# Patient Record
Sex: Female | Born: 1945
Health system: Southern US, Community
[De-identification: ages and names within clinical notes are randomized; demographics above are authoritative.]

## PROBLEM LIST (undated history)

## (undated) DIAGNOSIS — E785 Hyperlipidemia, unspecified: Secondary | ICD-10-CM

## (undated) DIAGNOSIS — F419 Anxiety disorder, unspecified: Secondary | ICD-10-CM

## (undated) DIAGNOSIS — M069 Rheumatoid arthritis, unspecified: Secondary | ICD-10-CM

## (undated) DIAGNOSIS — K219 Gastro-esophageal reflux disease without esophagitis: Secondary | ICD-10-CM

## (undated) DIAGNOSIS — M1990 Unspecified osteoarthritis, unspecified site: Secondary | ICD-10-CM

## (undated) DIAGNOSIS — B029 Zoster without complications: Secondary | ICD-10-CM

## (undated) DIAGNOSIS — T7840XA Allergy, unspecified, initial encounter: Secondary | ICD-10-CM

## (undated) DIAGNOSIS — I10 Essential (primary) hypertension: Secondary | ICD-10-CM

## (undated) HISTORY — DX: Anxiety disorder, unspecified: F41.9

## (undated) HISTORY — DX: Hyperlipidemia, unspecified: E78.5

## (undated) HISTORY — DX: Allergy, unspecified, initial encounter: T78.40XA

## (undated) HISTORY — DX: Essential (primary) hypertension: I10

## (undated) HISTORY — PX: FOOT SURGERY: SHX648

## (undated) HISTORY — DX: Gastro-esophageal reflux disease without esophagitis: K21.9

---

## 1992-09-19 HISTORY — PX: ABDOMINAL HYSTERECTOMY: SHX81

## 2008-07-22 ENCOUNTER — Ambulatory Visit: Payer: Self-pay | Admitting: Orthopedic Surgery

## 2008-07-29 ENCOUNTER — Ambulatory Visit: Payer: Self-pay | Admitting: Orthopedic Surgery

## 2008-07-29 HISTORY — PX: CARPAL TUNNEL RELEASE: SHX101

## 2013-07-02 DIAGNOSIS — E782 Mixed hyperlipidemia: Secondary | ICD-10-CM | POA: Diagnosis not present

## 2013-07-02 DIAGNOSIS — I10 Essential (primary) hypertension: Secondary | ICD-10-CM | POA: Diagnosis not present

## 2013-07-02 DIAGNOSIS — Z23 Encounter for immunization: Secondary | ICD-10-CM | POA: Diagnosis not present

## 2013-07-02 DIAGNOSIS — F411 Generalized anxiety disorder: Secondary | ICD-10-CM | POA: Diagnosis not present

## 2013-07-02 DIAGNOSIS — Z0000 Encounter for general adult medical examination without abnormal findings: Secondary | ICD-10-CM | POA: Diagnosis not present

## 2013-07-04 DIAGNOSIS — Z1231 Encounter for screening mammogram for malignant neoplasm of breast: Secondary | ICD-10-CM | POA: Diagnosis not present

## 2013-08-11 DIAGNOSIS — J019 Acute sinusitis, unspecified: Secondary | ICD-10-CM | POA: Diagnosis not present

## 2013-09-17 DIAGNOSIS — J069 Acute upper respiratory infection, unspecified: Secondary | ICD-10-CM | POA: Diagnosis not present

## 2013-10-04 DIAGNOSIS — I10 Essential (primary) hypertension: Secondary | ICD-10-CM | POA: Diagnosis not present

## 2013-10-04 DIAGNOSIS — F411 Generalized anxiety disorder: Secondary | ICD-10-CM | POA: Diagnosis not present

## 2013-11-04 DIAGNOSIS — I10 Essential (primary) hypertension: Secondary | ICD-10-CM | POA: Diagnosis not present

## 2013-11-18 DIAGNOSIS — R059 Cough, unspecified: Secondary | ICD-10-CM | POA: Diagnosis not present

## 2013-11-18 DIAGNOSIS — R05 Cough: Secondary | ICD-10-CM | POA: Diagnosis not present

## 2013-11-18 DIAGNOSIS — I10 Essential (primary) hypertension: Secondary | ICD-10-CM | POA: Diagnosis not present

## 2013-11-18 DIAGNOSIS — E782 Mixed hyperlipidemia: Secondary | ICD-10-CM | POA: Diagnosis not present

## 2014-02-18 DIAGNOSIS — E785 Hyperlipidemia, unspecified: Secondary | ICD-10-CM | POA: Diagnosis not present

## 2014-02-18 DIAGNOSIS — E781 Pure hyperglyceridemia: Secondary | ICD-10-CM | POA: Diagnosis not present

## 2014-02-18 DIAGNOSIS — I10 Essential (primary) hypertension: Secondary | ICD-10-CM | POA: Diagnosis not present

## 2014-02-18 DIAGNOSIS — F411 Generalized anxiety disorder: Secondary | ICD-10-CM | POA: Diagnosis not present

## 2014-03-20 DIAGNOSIS — I10 Essential (primary) hypertension: Secondary | ICD-10-CM | POA: Diagnosis not present

## 2014-03-20 DIAGNOSIS — E785 Hyperlipidemia, unspecified: Secondary | ICD-10-CM | POA: Diagnosis not present

## 2014-07-10 DIAGNOSIS — R296 Repeated falls: Secondary | ICD-10-CM | POA: Diagnosis not present

## 2014-07-10 DIAGNOSIS — E785 Hyperlipidemia, unspecified: Secondary | ICD-10-CM | POA: Diagnosis not present

## 2014-07-10 DIAGNOSIS — F419 Anxiety disorder, unspecified: Secondary | ICD-10-CM | POA: Diagnosis not present

## 2014-07-10 DIAGNOSIS — Z23 Encounter for immunization: Secondary | ICD-10-CM | POA: Diagnosis not present

## 2014-07-10 DIAGNOSIS — I10 Essential (primary) hypertension: Secondary | ICD-10-CM | POA: Diagnosis not present

## 2014-07-10 DIAGNOSIS — Z1211 Encounter for screening for malignant neoplasm of colon: Secondary | ICD-10-CM | POA: Diagnosis not present

## 2014-10-09 DIAGNOSIS — E785 Hyperlipidemia, unspecified: Secondary | ICD-10-CM | POA: Diagnosis not present

## 2014-10-09 DIAGNOSIS — I10 Essential (primary) hypertension: Secondary | ICD-10-CM | POA: Diagnosis not present

## 2014-10-09 DIAGNOSIS — F419 Anxiety disorder, unspecified: Secondary | ICD-10-CM | POA: Diagnosis not present

## 2014-10-09 DIAGNOSIS — K219 Gastro-esophageal reflux disease without esophagitis: Secondary | ICD-10-CM | POA: Diagnosis not present

## 2014-10-09 DIAGNOSIS — R21 Rash and other nonspecific skin eruption: Secondary | ICD-10-CM | POA: Diagnosis not present

## 2014-10-21 DIAGNOSIS — L301 Dyshidrosis [pompholyx]: Secondary | ICD-10-CM | POA: Diagnosis not present

## 2014-10-21 DIAGNOSIS — L03119 Cellulitis of unspecified part of limb: Secondary | ICD-10-CM | POA: Diagnosis not present

## 2015-01-08 DIAGNOSIS — I10 Essential (primary) hypertension: Secondary | ICD-10-CM | POA: Diagnosis not present

## 2015-01-08 DIAGNOSIS — E785 Hyperlipidemia, unspecified: Secondary | ICD-10-CM | POA: Diagnosis not present

## 2015-01-08 DIAGNOSIS — K219 Gastro-esophageal reflux disease without esophagitis: Secondary | ICD-10-CM | POA: Diagnosis not present

## 2015-01-08 DIAGNOSIS — Z1389 Encounter for screening for other disorder: Secondary | ICD-10-CM | POA: Diagnosis not present

## 2015-01-08 DIAGNOSIS — R011 Cardiac murmur, unspecified: Secondary | ICD-10-CM | POA: Diagnosis not present

## 2015-01-08 DIAGNOSIS — F419 Anxiety disorder, unspecified: Secondary | ICD-10-CM | POA: Diagnosis not present

## 2015-02-09 DIAGNOSIS — I10 Essential (primary) hypertension: Secondary | ICD-10-CM | POA: Diagnosis not present

## 2015-02-09 DIAGNOSIS — E785 Hyperlipidemia, unspecified: Secondary | ICD-10-CM | POA: Diagnosis not present

## 2015-04-08 ENCOUNTER — Telehealth: Payer: Self-pay | Admitting: Family Medicine

## 2015-04-08 ENCOUNTER — Encounter: Payer: Self-pay | Admitting: Family Medicine

## 2015-04-08 MED ORDER — ATORVASTATIN CALCIUM 20 MG PO TABS: 20.0000 mg | ORAL_TABLET | Freq: Every day | ORAL | Status: DC

## 2015-04-08 NOTE — Telephone Encounter (Signed)
Medication has been refilled and sent to [facility] patient has next scheduled appointment on 05/19/2015

## 2015-04-08 NOTE — Telephone Encounter (Signed)
NEED ATORVASTATIN 20MG  REFILLED. [place]

## 2015-04-08 NOTE — Telephone Encounter (Signed)
PT NOTIFIED ABOUT RX AND NEXT APPT

## 2015-04-20 ENCOUNTER — Other Ambulatory Visit: Payer: Self-pay | Admitting: Family Medicine

## 2015-05-19 ENCOUNTER — Encounter: Payer: Self-pay | Admitting: Family Medicine

## 2015-05-19 ENCOUNTER — Ambulatory Visit (INDEPENDENT_AMBULATORY_CARE_PROVIDER_SITE_OTHER): Payer: Medicare Other | Admitting: Family Medicine

## 2015-05-19 VITALS — BP 130/78 | HR 76 | Temp 98.0°F | Resp 18 | Ht 61.0 in | Wt 260.8 lb

## 2015-05-19 DIAGNOSIS — F419 Anxiety disorder, unspecified: Secondary | ICD-10-CM | POA: Diagnosis not present

## 2015-05-19 DIAGNOSIS — K219 Gastro-esophageal reflux disease without esophagitis: Secondary | ICD-10-CM | POA: Insufficient documentation

## 2015-05-19 DIAGNOSIS — I10 Essential (primary) hypertension: Secondary | ICD-10-CM | POA: Diagnosis not present

## 2015-05-19 DIAGNOSIS — Z9181 History of falling: Secondary | ICD-10-CM | POA: Insufficient documentation

## 2015-05-19 DIAGNOSIS — R748 Abnormal levels of other serum enzymes: Secondary | ICD-10-CM | POA: Diagnosis not present

## 2015-05-19 DIAGNOSIS — E785 Hyperlipidemia, unspecified: Secondary | ICD-10-CM | POA: Diagnosis not present

## 2015-05-19 DIAGNOSIS — R7989 Other specified abnormal findings of blood chemistry: Secondary | ICD-10-CM

## 2015-05-19 DIAGNOSIS — E668 Other obesity: Secondary | ICD-10-CM

## 2015-05-19 MED ORDER — METOPROLOL TARTRATE 100 MG PO TABS: 100.0000 mg | ORAL_TABLET | Freq: Two times a day (BID) | ORAL | Status: DC

## 2015-05-19 MED ORDER — ALPRAZOLAM 0.25 MG PO TABS: 0.2500 mg | ORAL_TABLET | Freq: Two times a day (BID) | ORAL | Status: DC | PRN

## 2015-05-19 MED ORDER — OMEPRAZOLE 20 MG PO CPDR: 20.0000 mg | DELAYED_RELEASE_CAPSULE | Freq: Every day | ORAL | Status: DC

## 2015-05-19 MED ORDER — ATORVASTATIN CALCIUM 20 MG PO TABS: 20.0000 mg | ORAL_TABLET | Freq: Every day | ORAL | Status: DC

## 2015-05-19 MED ORDER — VALSARTAN-HYDROCHLOROTHIAZIDE 320-25 MG PO TABS: 1.0000 | ORAL_TABLET | Freq: Every day | ORAL | Status: DC

## 2015-05-19 MED ORDER — LORCASERIN HCL 10 MG PO TABS: 1.0000 | ORAL_TABLET | Freq: Two times a day (BID) | ORAL | Status: DC

## 2015-05-19 NOTE — Progress Notes (Signed)
Name: Hayley Simmons   MRN: [id number]    DOB: 07-Jul-1946   Date:05/19/2015       Progress Note  Subjective  Chief Complaint  Chief Complaint  Patient presents with  . Follow-up    3 mo  . Hypertension  . Hyperlipidemia  . Gastrophageal Reflux  . Anxiety  . Medication Refill    all meds    Hypertension This is a chronic problem. The problem is controlled. Associated symptoms include anxiety. Pertinent negatives include no chest pain, headaches, palpitations or shortness of breath. Past treatments include angiotensin blockers, diuretics and beta blockers. There is no history of kidney disease, CAD/MI or CVA.  Hyperlipidemia This is a chronic problem. The problem is controlled. Recent lipid tests were reviewed and are high. Pertinent negatives include no chest pain, leg pain, myalgias or shortness of breath. Current antihyperlipidemic treatment includes statins. Risk factors for coronary artery disease include dyslipidemia.  Gastrophageal Reflux She reports no abdominal pain, no belching, no chest pain, no heartburn or no sore throat. This is a chronic problem. Risk factors include obesity. She has tried a PPI for the symptoms. Past procedures do not include an EGD.  Anxiety Presents for follow-up visit. Symptoms include irritability and nervous/anxious behavior. Patient reports no chest pain, insomnia, palpitations, panic or shortness of breath. Symptoms occur most days.   Past treatments include benzodiazephines. The treatment provided moderate (pt. feels stressed and irritable and is requesting an increase in dose of Alprazolam.) relief. Compliance with prior treatments has been good.  Obesity Pt. Is here to be considered for starting Belviq for obesity. She has gained 2 lbs in the last 3 months. BMI is 49.28, and she is considered morbidly obese. She has not tried any weight-loss supplement before.  Past Medical History  Diagnosis Date  . Hyperlipidemia   . Hypertension   .  Allergy   . Anxiety   . GERD (gastroesophageal reflux disease)     Past Surgical History  Procedure Laterality Date  . Carpal tunnel release Right 07/29/2008  . Abdominal hysterectomy  1994    Family History  Problem Relation Age of Onset  . Gout Mother   . Dementia Mother   . Kidney disease Mother   . Cancer Mother     kidney  . Heart attack Father     Social History   Social History  . Marital Status: Married    Spouse Name: N/A  . Number of Children: N/A  . Years of Education: N/A   Occupational History  . Not on file.   Social History Main Topics  . Smoking status: Former Research scientist (life sciences)  . Smokeless tobacco: Former Systems developer    Quit date: 04/19/1998  . Alcohol Use: No  . Drug Use: No  . Sexual Activity: No   Other Topics Concern  . Not on file   Social History Narrative  . No narrative on file     Current outpatient prescriptions:  .  ALPRAZolam (XANAX) 0.5 MG tablet, Take 0.5 mg by mouth at bedtime as needed for anxiety., Disp: , Rfl:  .  aspirin 81 MG tablet, Take by mouth., Disp: , Rfl:  .  atorvastatin (LIPITOR) 20 MG tablet, Take 1 tablet (20 mg total) by mouth at bedtime., Disp: 90 tablet, Rfl: 0 .  CALCIUM CARBONATE-VIT D-MIN PO, Take by mouth., Disp: , Rfl:  .  metoprolol (LOPRESSOR) 100 MG tablet, TAKE 1 TABLET BY MOUTH TWICE DAILY., Disp: 180 tablet, Rfl: 0 .  omeprazole (PRILOSEC)  20 MG capsule, TAKE 1 CAPSULE BY MOUTH DAILY., Disp: 90 capsule, Rfl: 0 .  valsartan-hydrochlorothiazide (DIOVAN-HCT) 320-25 MG per tablet, TAKE 1 TABLET BY MOUTH DAILY., Disp: 90 tablet, Rfl: 0  Allergies  Allergen Reactions  . Cyclosporine Other (See Comments) and Nausea And Vomiting  . Caduet  [Amlodipine-Atorvastatin]     makes pt feel bad  . Escitalopram Other (See Comments)    Review of Systems  Constitutional: Positive for irritability.  HENT: Negative for sore throat.   Respiratory: Negative for shortness of breath.   Cardiovascular: Negative for chest pain and  palpitations.  Gastrointestinal: Negative for heartburn and abdominal pain.  Musculoskeletal: Negative for myalgias.  Neurological: Negative for headaches.  Psychiatric/Behavioral: The patient is nervous/anxious. The patient does not have insomnia.     Objective  Filed Vitals:   05/19/15 0847  BP: 130/78  Pulse: 76  Temp: 98 F (36.7 C)  TempSrc: Oral  Resp: 18  Height: 5\' 1"  (1.549 m)  Weight: 260 lb 12.8 oz (118.298 kg)  SpO2: 90%    Physical Exam  Constitutional: She is oriented to person, place, and time and well-developed, well-nourished, and in no distress.  HENT:  Head: Normocephalic and atraumatic.  Cardiovascular: Normal rate and regular rhythm.   Pulmonary/Chest: Effort normal and breath sounds normal.  Neurological: She is alert and oriented to person, place, and time.  Skin: Skin is warm and dry.  Psychiatric: Memory, affect and judgment normal.  Nursing note and vitals reviewed.   Assessment & Plan  1. Essential hypertension  - metoprolol (LOPRESSOR) 100 MG tablet; Take 1 tablet (100 mg total) by mouth 2 (two) times daily.  Dispense: 180 tablet; Refill: 0 - valsartan-hydrochlorothiazide (DIOVAN-HCT) 320-25 MG per tablet; Take 1 tablet by mouth daily.  Dispense: 90 tablet; Refill: 0  2. Gastroesophageal reflux disease, esophagitis presence not specified  - omeprazole (PRILOSEC) 20 MG capsule; Take 1 capsule (20 mg total) by mouth daily.  Dispense: 90 capsule; Refill: 0  3. Hyperlipidemia  - atorvastatin (LIPITOR) 20 MG tablet; Take 1 tablet (20 mg total) by mouth at bedtime.  Dispense: 90 tablet; Refill: 1 - Basic Metabolic Panel (BMET) - Hepatic function panel - Lipid Profile  4. Anxiety  Continue on alprazolam at present dosage for now. Reevaluate in one month. - ALPRAZolam (XANAX) 0.25 MG tablet; Take 1 tablet (0.25 mg total) by mouth 2 (two) times daily as needed for anxiety.  Dispense: 60 tablet; Refill: 0  5. Extreme obesity  Started  patient on Belviq 10 mg twice a day. Patient educated on side effects and was advised to continue with dietary and lifestyle modification to achieve sustainable weight loss. Recheck in one month.  - Lorcaserin HCl (BELVIQ) 10 MG TABS; Take 1 tablet by mouth 2 (two) times daily.  Dispense: 60 tablet; Refill: 0 - CBC w/Diff/Platelet   Estefan Pattison Asad A. [facility] 05/19/2015 8:56 AM

## 2015-05-20 LAB — HEPATIC FUNCTION PANEL
ALBUMIN: 3.9 g/dL (ref 3.6–4.8)
ALT: 16 IU/L (ref 0–32)
AST: 17 IU/L (ref 0–40)
Alkaline Phosphatase: 141 IU/L — ABNORMAL HIGH (ref 39–117)
BILIRUBIN TOTAL: 0.5 mg/dL (ref 0.0–1.2)
Bilirubin, Direct: 0.14 mg/dL (ref 0.00–0.40)
TOTAL PROTEIN: 7 g/dL (ref 6.0–8.5)

## 2015-05-20 LAB — CBC WITH DIFFERENTIAL/PLATELET
BASOS ABS: 0 10*3/uL (ref 0.0–0.2)
Basos: 0 %
EOS (ABSOLUTE): 0.2 10*3/uL (ref 0.0–0.4)
Eos: 2 %
HEMATOCRIT: 39.2 % (ref 34.0–46.6)
Hemoglobin: 13.3 g/dL (ref 11.1–15.9)
Immature Grans (Abs): 0 10*3/uL (ref 0.0–0.1)
Immature Granulocytes: 0 %
LYMPHS ABS: 1.7 10*3/uL (ref 0.7–3.1)
Lymphs: 21 %
MCH: 29.6 pg (ref 26.6–33.0)
MCHC: 33.9 g/dL (ref 31.5–35.7)
MCV: 87 fL (ref 79–97)
MONOS ABS: 0.5 10*3/uL (ref 0.1–0.9)
Monocytes: 6 %
Neutrophils Absolute: 5.8 10*3/uL (ref 1.4–7.0)
Neutrophils: 71 %
Platelets: 275 10*3/uL (ref 150–379)
RBC: 4.5 x10E6/uL (ref 3.77–5.28)
RDW: 14.7 % (ref 12.3–15.4)
WBC: 8.3 10*3/uL (ref 3.4–10.8)

## 2015-05-20 LAB — LIPID PANEL
CHOL/HDL RATIO: 5.4 ratio — AB (ref 0.0–4.4)
Cholesterol, Total: 188 mg/dL (ref 100–199)
HDL: 35 mg/dL — AB (ref >39)
LDL CALC: 109 mg/dL — AB (ref 0–99)
Triglycerides: 220 mg/dL — ABNORMAL HIGH (ref 0–149)
VLDL CHOLESTEROL CAL: 44 mg/dL — AB (ref 5–40)

## 2015-05-20 LAB — BASIC METABOLIC PANEL
BUN/Creatinine Ratio: 16 (ref 11–26)
BUN: 19 mg/dL (ref 8–27)
CALCIUM: 9.4 mg/dL (ref 8.7–10.3)
CHLORIDE: 98 mmol/L (ref 97–108)
CO2: 27 mmol/L (ref 18–29)
Creatinine, Ser: 1.17 mg/dL — ABNORMAL HIGH (ref 0.57–1.00)
GFR calc Af Amer: 56 mL/min/{1.73_m2} — ABNORMAL LOW (ref >59)
GFR, EST NON AFRICAN AMERICAN: 49 mL/min/{1.73_m2} — AB (ref >59)
Glucose: 98 mg/dL (ref 65–99)
POTASSIUM: 4.5 mmol/L (ref 3.5–5.2)
Sodium: 142 mmol/L (ref 134–144)

## 2015-05-22 NOTE — Addendum Note (Signed)
Addended byManuella Ghazi, Kuzey Ogata A A on: 05/22/2015 03:57 PM   Modules accepted: Orders

## 2015-05-26 ENCOUNTER — Telehealth: Payer: Self-pay | Admitting: Family Medicine

## 2015-05-26 NOTE — Telephone Encounter (Signed)
Patient received a call on last week and was told that she was in kidney failure based on lab work. She was not told what to expect with this condition or anything.  Please return call to discuss this information. Please explain to her what is going on with this condition. [phone]

## 2015-05-27 NOTE — Telephone Encounter (Signed)
Spoke with patient and informed her that a referral has been made and she will be contacted to schedule appointment, patient verbalized understanding

## 2015-06-08 DIAGNOSIS — N183 Chronic kidney disease, stage 3 (moderate): Secondary | ICD-10-CM | POA: Diagnosis not present

## 2015-06-08 DIAGNOSIS — I10 Essential (primary) hypertension: Secondary | ICD-10-CM | POA: Diagnosis not present

## 2015-06-08 DIAGNOSIS — E785 Hyperlipidemia, unspecified: Secondary | ICD-10-CM | POA: Diagnosis not present

## 2015-06-10 DIAGNOSIS — N183 Chronic kidney disease, stage 3 (moderate): Secondary | ICD-10-CM | POA: Diagnosis not present

## 2015-06-10 DIAGNOSIS — E785 Hyperlipidemia, unspecified: Secondary | ICD-10-CM | POA: Diagnosis not present

## 2015-06-10 DIAGNOSIS — I10 Essential (primary) hypertension: Secondary | ICD-10-CM | POA: Diagnosis not present

## 2015-06-24 ENCOUNTER — Ambulatory Visit
Admission: RE | Admit: 2015-06-24 | Discharge: 2015-06-24 | Disposition: A | Discharge status: Home / Self Care | Payer: Medicare Other | Source: Ambulatory Visit | Attending: Family Medicine | Admitting: Family Medicine

## 2015-06-24 ENCOUNTER — Ambulatory Visit (INDEPENDENT_AMBULATORY_CARE_PROVIDER_SITE_OTHER): Payer: Medicare Other | Admitting: Family Medicine

## 2015-06-24 ENCOUNTER — Encounter: Payer: Self-pay | Admitting: Family Medicine

## 2015-06-24 DIAGNOSIS — M179 Osteoarthritis of knee, unspecified: Secondary | ICD-10-CM | POA: Diagnosis not present

## 2015-06-24 DIAGNOSIS — M25562 Pain in left knee: Principal | ICD-10-CM

## 2015-06-24 DIAGNOSIS — M25561 Pain in right knee: Secondary | ICD-10-CM

## 2015-06-24 DIAGNOSIS — Z23 Encounter for immunization: Secondary | ICD-10-CM

## 2015-06-24 DIAGNOSIS — I739 Peripheral vascular disease, unspecified: Secondary | ICD-10-CM | POA: Diagnosis not present

## 2015-06-24 DIAGNOSIS — M170 Bilateral primary osteoarthritis of knee: Secondary | ICD-10-CM | POA: Insufficient documentation

## 2015-06-24 DIAGNOSIS — M25461 Effusion, right knee: Secondary | ICD-10-CM | POA: Diagnosis not present

## 2015-06-24 DIAGNOSIS — F419 Anxiety disorder, unspecified: Secondary | ICD-10-CM

## 2015-06-24 DIAGNOSIS — N393 Stress incontinence (female) (male): Secondary | ICD-10-CM | POA: Insufficient documentation

## 2015-06-24 MED ORDER — ALPRAZOLAM 0.25 MG PO TABS: 0.2500 mg | ORAL_TABLET | Freq: Two times a day (BID) | ORAL | Status: DC | PRN

## 2015-06-24 MED ORDER — LORCASERIN HCL 10 MG PO TABS: 1.0000 | ORAL_TABLET | Freq: Two times a day (BID) | ORAL | Status: DC

## 2015-06-24 NOTE — Progress Notes (Signed)
Name: Hayley Simmons   MRN: [id number]    DOB: 02/27/1946   Date:06/24/2015       Progress Note  Subjective  Chief Complaint  Chief Complaint  Patient presents with  . Follow-up    1 mo Belviq  . Hyperlipidemia  . Anxiety  . Hypertension  . Gastrophageal Reflux    Knee Pain  The pain is present in the left knee and right knee (right knee started bothering her recently in the last 2 months.). The quality of the pain is described as aching. The pain is at a severity of 5/10. The pain is moderate. Pertinent negatives include no inability to bear weight, numbness or tingling. The symptoms are aggravated by movement. She has tried acetaminophen and NSAIDs for the symptoms.  Anxiety Presents for follow-up visit. Symptoms include depressed mood, excessive worry and nervous/anxious behavior.   Risk factors include recent illness (worried about recent drop in kidney function.). Past treatments include benzodiazephines. Compliance with prior treatments has been good.   Obesity Pt. Stated on Belviq 10 mg twice daily on August 30 th, 2016 and noticed her weight started dropping. No apparent side effects but concerned about drop in kidney function (for which she is being followed by Nephrology) and thinks it may have been from the [facility] considering nothing had otherwise changed. She has since then stopped taking Belviq but wants to restart. She has lost 11 lbs in the last 5 weeks.   Past Medical History  Diagnosis Date  . Hyperlipidemia   . Hypertension   . Allergy   . Anxiety   . GERD (gastroesophageal reflux disease)     Past Surgical History  Procedure Laterality Date  . Carpal tunnel release Right 07/29/2008  . Abdominal hysterectomy  1994    Family History  Problem Relation Age of Onset  . Gout Mother   . Dementia Mother   . Kidney disease Mother   . Cancer Mother     kidney  . Heart attack Father     Social History   Social History  . Marital Status: Married    Spouse  Name: N/A  . Number of Children: N/A  . Years of Education: N/A   Occupational History  . Not on file.   Social History Main Topics  . Smoking status: Former Research scientist (life sciences)  . Smokeless tobacco: Former Systems developer    Quit date: 04/19/1998  . Alcohol Use: No  . Drug Use: No  . Sexual Activity: No   Other Topics Concern  . Not on file   Social History Narrative    Current outpatient prescriptions:  .  ALPRAZolam (XANAX) 0.25 MG tablet, Take 1 tablet (0.25 mg total) by mouth 2 (two) times daily as needed for anxiety., Disp: 60 tablet, Rfl: 0 .  aspirin 81 MG tablet, Take by mouth., Disp: , Rfl:  .  atorvastatin (LIPITOR) 20 MG tablet, Take 1 tablet (20 mg total) by mouth at bedtime., Disp: 90 tablet, Rfl: 1 .  CALCIUM CARBONATE-VIT D-MIN PO, Take by mouth., Disp: , Rfl:  .  metoprolol (LOPRESSOR) 100 MG tablet, Take 1 tablet (100 mg total) by mouth 2 (two) times daily., Disp: 180 tablet, Rfl: 0 .  omeprazole (PRILOSEC) 20 MG capsule, Take 1 capsule (20 mg total) by mouth daily., Disp: 90 capsule, Rfl: 0 .  valsartan-hydrochlorothiazide (DIOVAN-HCT) 320-25 MG per tablet, Take 1 tablet by mouth daily., Disp: 90 tablet, Rfl: 0 .  Lorcaserin HCl (BELVIQ) 10 MG TABS, Take 1 tablet by  mouth 2 (two) times daily. (Patient not taking: Reported on 06/24/2015), Disp: 60 tablet, Rfl: 0  Allergies  Allergen Reactions  . Cyclosporine Other (See Comments) and Nausea And Vomiting  . Caduet  [Amlodipine-Atorvastatin]     makes pt feel bad  . Escitalopram Other (See Comments)   Review of Systems  Constitutional: Positive for weight loss. Negative for fever and chills.  Musculoskeletal: Positive for back pain and joint pain.  Neurological: Negative for tingling and numbness.  Psychiatric/Behavioral: The patient is nervous/anxious.    Objective  Filed Vitals:   06/24/15 0818  BP: 132/77  Pulse: 67  Temp: 97.8 F (36.6 C)  TempSrc: Oral  Resp: 18  Height: 5\' 1"  (1.549 m)  Weight: 249 lb 1.6 oz  (112.991 kg)  SpO2: 93%   Physical Exam  Constitutional: She is oriented to person, place, and time and well-developed, well-nourished, and in no distress.  Cardiovascular: Normal rate and regular rhythm.   Pulmonary/Chest: Effort normal and breath sounds normal.  Musculoskeletal:       Right knee: She exhibits no swelling. No tenderness found.       Left knee: She exhibits no swelling. No tenderness found.  Squeaky crepitus in the left knee.  Neurological: She is alert and oriented to person, place, and time.  Nursing note and vitals reviewed.  Assessment & Plan  1. Morbid obesity due to excess calories ([place]) Refills for Belviq provided. Patient is to contact nephrology for clearance before restarting the medication. - Lorcaserin HCl (BELVIQ) 10 MG TABS; Take 1 tablet by mouth 2 (two) times daily.  Dispense: 60 tablet; Refill: 2  2. Bilateral knee pain Suspect osteoarthritis. Obtain x-rays and referral to orthopedics. - DG Knee Complete 4 Views Left; Future - DG Knee Complete 4 Views Right; Future  3. Anxiety Symptoms of anxiety are stable and controlled on alprazolam daily as needed. Refills provided. - ALPRAZolam (XANAX) 0.25 MG tablet; Take 1 tablet (0.25 mg total) by mouth 2 (two) times daily as needed for anxiety.  Dispense: 60 tablet; Refill: 0      Ashleah Valtierra Asad A. [facility] 06/24/2015 8:28 AM

## 2015-06-29 ENCOUNTER — Other Ambulatory Visit: Payer: Self-pay | Admitting: Family Medicine

## 2015-06-29 DIAGNOSIS — M170 Bilateral primary osteoarthritis of knee: Secondary | ICD-10-CM

## 2015-07-08 DIAGNOSIS — N179 Acute kidney failure, unspecified: Secondary | ICD-10-CM | POA: Diagnosis not present

## 2015-07-08 DIAGNOSIS — I10 Essential (primary) hypertension: Secondary | ICD-10-CM | POA: Diagnosis not present

## 2015-07-08 DIAGNOSIS — E875 Hyperkalemia: Secondary | ICD-10-CM | POA: Diagnosis not present

## 2015-07-08 DIAGNOSIS — N183 Chronic kidney disease, stage 3 (moderate): Secondary | ICD-10-CM | POA: Diagnosis not present

## 2015-07-27 ENCOUNTER — Ambulatory Visit (INDEPENDENT_AMBULATORY_CARE_PROVIDER_SITE_OTHER): Payer: Medicare Other | Admitting: Family Medicine

## 2015-07-27 ENCOUNTER — Telehealth: Payer: Self-pay | Admitting: Family Medicine

## 2015-07-27 ENCOUNTER — Encounter: Payer: Self-pay | Admitting: Family Medicine

## 2015-07-27 DIAGNOSIS — Z1231 Encounter for screening mammogram for malignant neoplasm of breast: Secondary | ICD-10-CM

## 2015-07-27 DIAGNOSIS — E785 Hyperlipidemia, unspecified: Secondary | ICD-10-CM

## 2015-07-27 DIAGNOSIS — Z0000 Encounter for general adult medical examination without abnormal findings: Secondary | ICD-10-CM

## 2015-07-27 MED ORDER — ATORVASTATIN CALCIUM 20 MG PO TABS: 20.0000 mg | ORAL_TABLET | Freq: Every day | ORAL | Status: DC

## 2015-07-27 NOTE — Telephone Encounter (Signed)
Was seen today, but is requesting a refill on Atorvastatin 40 mg. Please send to [facility]

## 2015-07-27 NOTE — Telephone Encounter (Signed)
Medication has been refilled and sent to [facility] 

## 2015-07-27 NOTE — Progress Notes (Signed)
Name: Hayley Simmons   MRN: [id number]    DOB: 1946-05-16   Date:07/27/2015       Progress Note  Subjective  Chief Complaint  Chief Complaint  Patient presents with  . Annual Exam    CPE  . Gastroesophageal Reflux  . Hyperlipidemia  . Hypertension    HPI  Pt. Is here for a CPE. She is doing well, but is concerned about the pain in both knees.  Past Medical History  Diagnosis Date  . Hyperlipidemia   . Hypertension   . Allergy   . Anxiety   . GERD (gastroesophageal reflux disease)     Past Surgical History  Procedure Laterality Date  . Carpal tunnel release Right 07/29/2008  . Abdominal hysterectomy  1994    Family History  Problem Relation Age of Onset  . Gout Mother   . Dementia Mother   . Kidney disease Mother   . Cancer Mother     kidney  . Heart attack Father     Social History   Social History  . Marital Status: Married    Spouse Name: N/A  . Number of Children: N/A  . Years of Education: N/A   Occupational History  . Not on file.   Social History Main Topics  . Smoking status: Former Research scientist (life sciences)  . Smokeless tobacco: Former Systems developer    Quit date: 04/19/1998  . Alcohol Use: No  . Drug Use: No  . Sexual Activity: No   Other Topics Concern  . Not on file   Social History Narrative     Current outpatient prescriptions:  .  ALPRAZolam (XANAX) 0.25 MG tablet, Take 1 tablet (0.25 mg total) by mouth 2 (two) times daily as needed for anxiety., Disp: 60 tablet, Rfl: 0 .  aspirin 81 MG tablet, Take by mouth., Disp: , Rfl:  .  atorvastatin (LIPITOR) 20 MG tablet, Take 1 tablet (20 mg total) by mouth at bedtime., Disp: 90 tablet, Rfl: 1 .  CALCIUM CARBONATE-VIT D-MIN PO, Take by mouth., Disp: , Rfl:  .  Lorcaserin HCl (BELVIQ) 10 MG TABS, Take 1 tablet by mouth 2 (two) times daily., Disp: 60 tablet, Rfl: 2 .  metoprolol (LOPRESSOR) 100 MG tablet, Take 1 tablet (100 mg total) by mouth 2 (two) times daily., Disp: 180 tablet, Rfl: 0 .  omeprazole (PRILOSEC)  20 MG capsule, Take 1 capsule (20 mg total) by mouth daily., Disp: 90 capsule, Rfl: 0 .  valsartan-hydrochlorothiazide (DIOVAN-HCT) 320-25 MG per tablet, Take 1 tablet by mouth daily., Disp: 90 tablet, Rfl: 0  Allergies  Allergen Reactions  . Cyclosporine Other (See Comments) and Nausea And Vomiting  . Caduet  [Amlodipine-Atorvastatin]     makes pt feel bad  . Escitalopram Other (See Comments)     Review of Systems  Constitutional: Negative for fever, chills, weight loss and malaise/fatigue.  HENT: Positive for sore throat (occasionally). Negative for congestion and ear pain.   Eyes: Negative for blurred vision, double vision and pain.  Respiratory: Positive for cough (intermittent cough for over a month with clear sputum) and sputum production. Negative for shortness of breath and wheezing.   Cardiovascular: Negative for chest pain and palpitations.  Gastrointestinal: Negative for heartburn, nausea, diarrhea, constipation and blood in stool.  Genitourinary: Negative for dysuria, urgency, frequency and hematuria.  Musculoskeletal: Positive for joint pain. Negative for myalgias, back pain and neck pain.  Skin: Negative for rash.  Neurological: Negative for dizziness and headaches.  Endo/Heme/Allergies: Does not bruise/bleed easily.  Psychiatric/Behavioral: Positive for depression. The patient is not nervous/anxious and does not have insomnia.     Objective  Filed Vitals:   07/27/15 0907  BP: 133/70  Pulse: 68  Temp: 97.5 F (36.4 C)  TempSrc: Oral  Resp: 17  Height: 5\' 1"  (1.549 m)  Weight: 239 lb (108.41 kg)  SpO2: 94%    Physical Exam  Constitutional: She is oriented to person, place, and time and well-developed, well-nourished, and in no distress.  HENT:  Head: Normocephalic and atraumatic.  Eyes: Conjunctivae are normal. Pupils are equal, round, and reactive to light.  Neck: Neck supple.  Cardiovascular: Normal rate, regular rhythm and normal heart sounds.    Pulmonary/Chest: Effort normal and breath sounds normal. She has no wheezes. She has no rales.  Clinical breast exam deferred.  Abdominal: Soft. Bowel sounds are normal. There is no tenderness.  Genitourinary:  Deferred  Neurological: She is alert and oriented to person, place, and time.  Skin: Skin is warm and dry.  Psychiatric: Memory, affect and judgment normal.  Nursing note and vitals reviewed.     Assessment & Plan  1. Annual physical exam Patient is status post hysterectomy and over 65, Pap smear not indicated.  Mammogram will be ordered today.  Patient declines colonoscopy at this time. She wants to get ' my knees straightened out first' Lipid panel was ordered in August 2016, patient on statin therapy.  2. Encounter for screening mammogram for malignant neoplasm of breast  - MM Digital Screening; Future   Kimberlyn Quiocho Asad A. [facility] 07/27/2015 9:45 AM

## 2015-07-30 DIAGNOSIS — M25561 Pain in right knee: Secondary | ICD-10-CM | POA: Diagnosis not present

## 2015-07-30 DIAGNOSIS — M170 Bilateral primary osteoarthritis of knee: Secondary | ICD-10-CM | POA: Diagnosis not present

## 2015-07-30 DIAGNOSIS — M25562 Pain in left knee: Secondary | ICD-10-CM | POA: Diagnosis not present

## 2015-07-31 ENCOUNTER — Other Ambulatory Visit: Payer: Self-pay

## 2015-07-31 DIAGNOSIS — E785 Hyperlipidemia, unspecified: Secondary | ICD-10-CM

## 2015-07-31 MED ORDER — ATORVASTATIN CALCIUM 40 MG PO TABS: 40.0000 mg | ORAL_TABLET | Freq: Every day | ORAL | Status: DC

## 2015-08-25 ENCOUNTER — Ambulatory Visit: Payer: Medicare Other | Admitting: Family Medicine

## 2015-09-16 ENCOUNTER — Encounter: Payer: Self-pay | Admitting: Family Medicine

## 2015-09-16 ENCOUNTER — Ambulatory Visit (INDEPENDENT_AMBULATORY_CARE_PROVIDER_SITE_OTHER): Payer: Medicare Other | Admitting: Family Medicine

## 2015-09-16 VITALS — BP 130/75 | HR 79 | Temp 98.0°F | Resp 17 | Ht 61.0 in | Wt 233.3 lb

## 2015-09-16 DIAGNOSIS — E785 Hyperlipidemia, unspecified: Secondary | ICD-10-CM | POA: Diagnosis not present

## 2015-09-16 DIAGNOSIS — K219 Gastro-esophageal reflux disease without esophagitis: Secondary | ICD-10-CM

## 2015-09-16 DIAGNOSIS — I10 Essential (primary) hypertension: Secondary | ICD-10-CM | POA: Diagnosis not present

## 2015-09-16 DIAGNOSIS — F419 Anxiety disorder, unspecified: Secondary | ICD-10-CM

## 2015-09-16 MED ORDER — ATORVASTATIN CALCIUM 40 MG PO TABS: 40.0000 mg | ORAL_TABLET | Freq: Every day | ORAL | Status: DC

## 2015-09-16 MED ORDER — VALSARTAN-HYDROCHLOROTHIAZIDE 320-25 MG PO TABS: 1.0000 | ORAL_TABLET | Freq: Every day | ORAL | Status: DC

## 2015-09-16 MED ORDER — OMEPRAZOLE 20 MG PO CPDR: 20.0000 mg | DELAYED_RELEASE_CAPSULE | Freq: Every day | ORAL | Status: DC

## 2015-09-16 MED ORDER — ALPRAZOLAM 0.25 MG PO TABS: 0.2500 mg | ORAL_TABLET | Freq: Two times a day (BID) | ORAL | Status: DC | PRN

## 2015-09-16 MED ORDER — METOPROLOL TARTRATE 100 MG PO TABS: 100.0000 mg | ORAL_TABLET | Freq: Two times a day (BID) | ORAL | Status: DC

## 2015-09-16 NOTE — Progress Notes (Signed)
Name: Hayley Simmons   MRN: [id number]    DOB: 02-19-1946   Date:09/16/2015       Progress Note  Subjective  Chief Complaint  Chief Complaint  Patient presents with  . Follow-up    2 mo  . Gastroesophageal Reflux  . Hyperlipidemia  . Hypertension    Hyperlipidemia This is a chronic problem. The problem is controlled. Recent lipid tests were reviewed and are normal. Exacerbating diseases include obesity. Pertinent negatives include no chest pain, leg pain, myalgias or shortness of breath. Current antihyperlipidemic treatment includes statins. Risk factors for coronary artery disease include dyslipidemia.  Hypertension This is a chronic problem. The problem is unchanged. The problem is controlled. Associated symptoms include anxiety. Pertinent negatives include no blurred vision, chest pain, headaches, palpitations or shortness of breath. Past treatments include angiotensin blockers, diuretics and beta blockers. Hypertensive end-organ damage includes kidney disease. There is no history of CAD/MI or CVA.  Anxiety Presents for follow-up visit. The problem has been unchanged. Patient reports no chest pain, excessive worry, insomnia, irritability, nervous/anxious behavior, palpitations, panic or shortness of breath. Symptoms occur most days.   Past treatments include benzodiazephines. The treatment provided moderate (pt. feels stressed and irritable and is requesting an increase in dose of Alprazolam.) relief. Compliance with prior treatments has been good.  Gastroesophageal Reflux She reports no abdominal pain, no belching, no chest pain, no heartburn or no sore throat. This is a chronic problem. Risk factors include obesity. She has tried a PPI for the symptoms. Past procedures do not include an EGD.    Past Medical History  Diagnosis Date  . Hyperlipidemia   . Hypertension   . Allergy   . Anxiety   . GERD (gastroesophageal reflux disease)     Past Surgical History  Procedure  Laterality Date  . Carpal tunnel release Right 07/29/2008  . Abdominal hysterectomy  1994    Family History  Problem Relation Age of Onset  . Gout Mother   . Dementia Mother   . Kidney disease Mother   . Cancer Mother     kidney  . Heart attack Father     Social History   Social History  . Marital Status: Married    Spouse Name: N/A  . Number of Children: N/A  . Years of Education: N/A   Occupational History  . Not on file.   Social History Main Topics  . Smoking status: Former Research scientist (life sciences)  . Smokeless tobacco: Former Systems developer    Quit date: 04/19/1998  . Alcohol Use: No  . Drug Use: No  . Sexual Activity: No   Other Topics Concern  . Not on file   Social History Narrative     Current outpatient prescriptions:  .  ALPRAZolam (XANAX) 0.25 MG tablet, Take 1 tablet (0.25 mg total) by mouth 2 (two) times daily as needed for anxiety., Disp: 60 tablet, Rfl: 0 .  aspirin 81 MG tablet, Take by mouth., Disp: , Rfl:  .  atorvastatin (LIPITOR) 40 MG tablet, Take 1 tablet (40 mg total) by mouth at bedtime., Disp: 90 tablet, Rfl: 0 .  CALCIUM CARBONATE-VIT D-MIN PO, Take by mouth., Disp: , Rfl:  .  metoprolol (LOPRESSOR) 100 MG tablet, Take 1 tablet (100 mg total) by mouth 2 (two) times daily., Disp: 180 tablet, Rfl: 0 .  omeprazole (PRILOSEC) 20 MG capsule, Take 1 capsule (20 mg total) by mouth daily., Disp: 90 capsule, Rfl: 0 .  valsartan-hydrochlorothiazide (DIOVAN-HCT) 320-25 MG per tablet, Take 1  tablet by mouth daily., Disp: 90 tablet, Rfl: 0  Allergies  Allergen Reactions  . Cyclosporine Other (See Comments), Nausea And Vomiting and Nausea Only  . Caduet  [Amlodipine-Atorvastatin] Other (See Comments)    Makes Pt feel bad makes pt feel bad  . Escitalopram Other (See Comments)     Review of Systems  Constitutional: Negative for irritability.  HENT: Negative for sore throat.   Eyes: Negative for blurred vision.  Respiratory: Negative for shortness of breath.    Cardiovascular: Negative for chest pain and palpitations.  Gastrointestinal: Negative for heartburn and abdominal pain.  Musculoskeletal: Negative for myalgias.  Neurological: Negative for headaches.  Psychiatric/Behavioral: The patient is not nervous/anxious and does not have insomnia.     Objective  Filed Vitals:   09/16/15 0826  BP: 130/75  Pulse: 79  Temp: 98 F (36.7 C)  TempSrc: Oral  Resp: 17  Height: 5\' 1"  (1.549 m)  Weight: 233 lb 4.8 oz (105.824 kg)  SpO2: 95%    Physical Exam  Constitutional: She is oriented to person, place, and time and well-developed, well-nourished, and in no distress.  HENT:  Head: Normocephalic and atraumatic.  Cardiovascular: Normal rate, regular rhythm and normal heart sounds.   Pulmonary/Chest: Effort normal and breath sounds normal.  Musculoskeletal: She exhibits no edema.  Neurological: She is alert and oriented to person, place, and time.  Nursing note and vitals reviewed.   Assessment & Plan  1. Anxiety  - ALPRAZolam (XANAX) 0.25 MG tablet; Take 1 tablet (0.25 mg total) by mouth 2 (two) times daily as needed for anxiety.  Dispense: 60 tablet; Refill: 2  2. Essential hypertension  - metoprolol (LOPRESSOR) 100 MG tablet; Take 1 tablet (100 mg total) by mouth 2 (two) times daily.  Dispense: 180 tablet; Refill: 0 - valsartan-hydrochlorothiazide (DIOVAN-HCT) 320-25 MG tablet; Take 1 tablet by mouth daily.  Dispense: 90 tablet; Refill: 0  3. Gastroesophageal reflux disease, esophagitis presence not specified  - omeprazole (PRILOSEC) 20 MG capsule; Take 1 capsule (20 mg total) by mouth daily.  Dispense: 90 capsule; Refill: 0  4. Hyperlipidemia  - atorvastatin (LIPITOR) 40 MG tablet; Take 1 tablet (40 mg total) by mouth at bedtime.  Dispense: 90 tablet; Refill: 0 - Lipid Profile - Comprehensive Metabolic Panel (CMET)   Hayley Simmons Asad A. [facility] 09/16/2015 8:36 AM

## 2015-09-28 DIAGNOSIS — I10 Essential (primary) hypertension: Secondary | ICD-10-CM | POA: Diagnosis not present

## 2015-09-28 DIAGNOSIS — E785 Hyperlipidemia, unspecified: Secondary | ICD-10-CM | POA: Diagnosis not present

## 2015-09-28 DIAGNOSIS — N183 Chronic kidney disease, stage 3 (moderate): Secondary | ICD-10-CM | POA: Diagnosis not present

## 2015-12-21 ENCOUNTER — Encounter: Payer: Self-pay | Admitting: Family Medicine

## 2015-12-21 ENCOUNTER — Ambulatory Visit (INDEPENDENT_AMBULATORY_CARE_PROVIDER_SITE_OTHER): Payer: Medicare Other | Admitting: Family Medicine

## 2015-12-21 VITALS — BP 130/68 | HR 67 | Temp 98.0°F | Resp 16 | Ht 61.0 in | Wt 237.4 lb

## 2015-12-21 DIAGNOSIS — I10 Essential (primary) hypertension: Secondary | ICD-10-CM | POA: Diagnosis not present

## 2015-12-21 DIAGNOSIS — F419 Anxiety disorder, unspecified: Secondary | ICD-10-CM | POA: Diagnosis not present

## 2015-12-21 DIAGNOSIS — K219 Gastro-esophageal reflux disease without esophagitis: Secondary | ICD-10-CM

## 2015-12-21 DIAGNOSIS — E785 Hyperlipidemia, unspecified: Secondary | ICD-10-CM

## 2015-12-21 MED ORDER — OMEPRAZOLE 20 MG PO CPDR: 20.0000 mg | DELAYED_RELEASE_CAPSULE | Freq: Every day | ORAL | Status: DC

## 2015-12-21 MED ORDER — METOPROLOL TARTRATE 100 MG PO TABS: 100.0000 mg | ORAL_TABLET | Freq: Two times a day (BID) | ORAL | Status: DC

## 2015-12-21 MED ORDER — ATORVASTATIN CALCIUM 40 MG PO TABS: 40.0000 mg | ORAL_TABLET | Freq: Every day | ORAL | Status: DC

## 2015-12-21 MED ORDER — VALSARTAN-HYDROCHLOROTHIAZIDE 320-25 MG PO TABS: 1.0000 | ORAL_TABLET | Freq: Every day | ORAL | Status: DC

## 2015-12-21 MED ORDER — ALPRAZOLAM 0.25 MG PO TABS: 0.2500 mg | ORAL_TABLET | Freq: Two times a day (BID) | ORAL | Status: DC | PRN

## 2015-12-21 NOTE — Progress Notes (Signed)
Name: Hayley Simmons   MRN: [id number]    DOB: [birth year]   Date:12/21/2015       Progress Note  Subjective  Chief Complaint  Chief Complaint  Patient presents with  . Follow-up    3 mo  . Gastroesophageal Reflux  . Hyperlipidemia  . Medication Refill    Gastroesophageal Reflux She reports no abdominal pain, no belching, no chest pain, no heartburn or no sore throat. This is a chronic problem. Risk factors include obesity. She has tried a PPI for the symptoms. Past procedures do not include an EGD.  Hyperlipidemia This is a chronic problem. The problem is controlled. Recent lipid tests were reviewed and are normal. Exacerbating diseases include obesity. Pertinent negatives include no chest pain, leg pain, myalgias or shortness of breath. Current antihyperlipidemic treatment includes statins. Risk factors for coronary artery disease include dyslipidemia.  Hypertension This is a chronic problem. The problem is unchanged. The problem is controlled. Associated symptoms include anxiety. Pertinent negatives include no blurred vision, chest pain, headaches, palpitations or shortness of breath. Past treatments include angiotensin blockers, diuretics and beta blockers. Hypertensive end-organ damage includes kidney disease. There is no history of CAD/MI or CVA.  Anxiety Presents for follow-up visit. The problem has been unchanged. Patient reports no chest pain, excessive worry, insomnia, irritability, nervous/anxious behavior, palpitations, panic or shortness of breath. Symptoms occur most days.   Past treatments include benzodiazephines. The treatment provided significant relief. Compliance with prior treatments has been good.     Past Medical History  Diagnosis Date  . Hyperlipidemia   . Hypertension   . Allergy   . Anxiety   . GERD (gastroesophageal reflux disease)     Past Surgical History  Procedure Laterality Date  . Carpal tunnel release Right 07/29/2008  . Abdominal hysterectomy   1994    Family History  Problem Relation Age of Onset  . Gout Mother   . Dementia Mother   . Kidney disease Mother   . Cancer Mother     kidney  . Heart attack Father     Social History   Social History  . Marital Status: Married    Spouse Name: N/A  . Number of Children: N/A  . Years of Education: N/A   Occupational History  . Not on file.   Social History Main Topics  . Smoking status: Former Research scientist (life sciences)  . Smokeless tobacco: Former Systems developer    Quit date: 04/19/1998  . Alcohol Use: No  . Drug Use: No  . Sexual Activity: No   Other Topics Concern  . Not on file   Social History Narrative     Current outpatient prescriptions:  .  ALPRAZolam (XANAX) 0.25 MG tablet, Take 1 tablet (0.25 mg total) by mouth 2 (two) times daily as needed for anxiety., Disp: 60 tablet, Rfl: 2 .  aspirin 81 MG tablet, Take by mouth., Disp: , Rfl:  .  atorvastatin (LIPITOR) 40 MG tablet, Take 1 tablet (40 mg total) by mouth at bedtime., Disp: 90 tablet, Rfl: 0 .  BELVIQ 10 MG TABS, TK 1 T PO BID., Disp: , Rfl: 2 .  CALCIUM CARBONATE-VIT D-MIN PO, Take by mouth., Disp: , Rfl:  .  metoprolol (LOPRESSOR) 100 MG tablet, Take 1 tablet (100 mg total) by mouth 2 (two) times daily., Disp: 180 tablet, Rfl: 0 .  omeprazole (PRILOSEC) 20 MG capsule, Take 1 capsule (20 mg total) by mouth daily., Disp: 90 capsule, Rfl: 0 .  valsartan-hydrochlorothiazide (DIOVAN-HCT) 320-25 MG tablet,  Take 1 tablet by mouth daily., Disp: 90 tablet, Rfl: 0  Allergies  Allergen Reactions  . Cyclosporine Other (See Comments), Nausea And Vomiting and Nausea Only  . Caduet  [Amlodipine-Atorvastatin] Other (See Comments)    Makes Pt feel bad makes pt feel bad  . Escitalopram Other (See Comments)     Review of Systems  Constitutional: Negative for irritability.  HENT: Negative for sore throat.   Eyes: Negative for blurred vision.  Respiratory: Negative for shortness of breath.   Cardiovascular: Negative for chest pain and  palpitations.  Gastrointestinal: Negative for heartburn and abdominal pain.  Musculoskeletal: Negative for myalgias.  Neurological: Negative for headaches.  Psychiatric/Behavioral: The patient is not nervous/anxious and does not have insomnia.      Objective  Filed Vitals:   12/21/15 0822  BP: 130/68  Pulse: 67  Temp: 98 F (36.7 C)  TempSrc: Oral  Resp: 16  Height: 5\' 1"  (1.549 m)  Weight: 237 lb 6.4 oz (107.684 kg)  SpO2: 95%    Physical Exam  Constitutional: She is oriented to person, place, and time and well-developed, well-nourished, and in no distress.  HENT:  Head: Normocephalic and atraumatic.  Cardiovascular: Normal rate, regular rhythm and normal heart sounds.   Pulmonary/Chest: Effort normal and breath sounds normal.  Musculoskeletal: She exhibits no edema.  Neurological: She is alert and oriented to person, place, and time.  Nursing note and vitals reviewed.     Assessment & Plan  1. Anxiety Anxiety symptoms are stable and controlled on alprazolam taken twice daily as needed - ALPRAZolam (XANAX) 0.25 MG tablet; Take 1 tablet (0.25 mg total) by mouth 2 (two) times daily as needed for anxiety.  Dispense: 60 tablet; Refill: 2  2. Essential hypertension Blood pressure is at goal, continue therapy as prescribed - metoprolol (LOPRESSOR) 100 MG tablet; Take 1 tablet (100 mg total) by mouth 2 (two) times daily.  Dispense: 180 tablet; Refill: 0 - valsartan-hydrochlorothiazide (DIOVAN-HCT) 320-25 MG tablet; Take 1 tablet by mouth daily.  Dispense: 90 tablet; Refill: 0  3. Gastroesophageal reflux disease, esophagitis presence not specified  - omeprazole (PRILOSEC) 20 MG capsule; Take 1 capsule (20 mg total) by mouth daily.  Dispense: 90 capsule; Refill: 0  4. Hyperlipidemia  - Atorvastatin (LIPITOR) 40 MG tablet; Take 1 tablet (40 mg total) by mouth at bedtime.  Dispense: 90 tablet; Refill: 0   Gurinder Toral [facility] 12/21/2015 8:45 AM

## 2015-12-22 LAB — LIPID PANEL
CHOL/HDL RATIO: 4.9 ratio — AB (ref 0.0–4.4)
Cholesterol, Total: 171 mg/dL (ref 100–199)
HDL: 35 mg/dL — AB (ref >39)
LDL Calculated: 96 mg/dL (ref 0–99)
TRIGLYCERIDES: 202 mg/dL — AB (ref 0–149)
VLDL CHOLESTEROL CAL: 40 mg/dL (ref 5–40)

## 2015-12-22 LAB — COMPREHENSIVE METABOLIC PANEL
A/G RATIO: 1.6 (ref 1.2–2.2)
ALK PHOS: 135 IU/L — AB (ref 39–117)
ALT: 11 IU/L (ref 0–32)
AST: 15 IU/L (ref 0–40)
Albumin: 4.2 g/dL (ref 3.6–4.8)
BILIRUBIN TOTAL: 0.5 mg/dL (ref 0.0–1.2)
BUN/Creatinine Ratio: 16 (ref 12–28)
BUN: 15 mg/dL (ref 8–27)
CHLORIDE: 103 mmol/L (ref 96–106)
CO2: 25 mmol/L (ref 18–29)
Calcium: 9.6 mg/dL (ref 8.7–10.3)
Creatinine, Ser: 0.94 mg/dL (ref 0.57–1.00)
GFR calc non Af Amer: 63 mL/min/{1.73_m2} (ref >59)
GFR, EST AFRICAN AMERICAN: 73 mL/min/{1.73_m2} (ref >59)
Globulin, Total: 2.7 g/dL (ref 1.5–4.5)
Glucose: 110 mg/dL — ABNORMAL HIGH (ref 65–99)
POTASSIUM: 5.1 mmol/L (ref 3.5–5.2)
Sodium: 146 mmol/L — ABNORMAL HIGH (ref 134–144)
TOTAL PROTEIN: 6.9 g/dL (ref 6.0–8.5)

## 2016-03-18 DIAGNOSIS — I10 Essential (primary) hypertension: Secondary | ICD-10-CM | POA: Diagnosis not present

## 2016-03-18 DIAGNOSIS — E785 Hyperlipidemia, unspecified: Secondary | ICD-10-CM | POA: Diagnosis not present

## 2016-03-18 DIAGNOSIS — N179 Acute kidney failure, unspecified: Secondary | ICD-10-CM | POA: Diagnosis not present

## 2016-03-21 ENCOUNTER — Encounter: Payer: Self-pay | Admitting: Family Medicine

## 2016-03-21 ENCOUNTER — Ambulatory Visit (INDEPENDENT_AMBULATORY_CARE_PROVIDER_SITE_OTHER): Payer: Medicare Other | Admitting: Family Medicine

## 2016-03-21 VITALS — BP 132/76 | HR 63 | Temp 97.7°F | Resp 15 | Ht 61.0 in | Wt 241.5 lb

## 2016-03-21 DIAGNOSIS — R739 Hyperglycemia, unspecified: Secondary | ICD-10-CM | POA: Diagnosis not present

## 2016-03-21 DIAGNOSIS — K219 Gastro-esophageal reflux disease without esophagitis: Secondary | ICD-10-CM

## 2016-03-21 DIAGNOSIS — I10 Essential (primary) hypertension: Secondary | ICD-10-CM | POA: Diagnosis not present

## 2016-03-21 DIAGNOSIS — E785 Hyperlipidemia, unspecified: Secondary | ICD-10-CM

## 2016-03-21 DIAGNOSIS — F419 Anxiety disorder, unspecified: Secondary | ICD-10-CM

## 2016-03-21 LAB — POCT GLYCOSYLATED HEMOGLOBIN (HGB A1C): Hemoglobin A1C: 6.5

## 2016-03-21 MED ORDER — ALPRAZOLAM 0.25 MG PO TABS: 0.2500 mg | ORAL_TABLET | Freq: Two times a day (BID) | ORAL | Status: DC | PRN

## 2016-03-21 MED ORDER — OMEPRAZOLE 20 MG PO CPDR: 20.0000 mg | DELAYED_RELEASE_CAPSULE | Freq: Every day | ORAL | Status: DC

## 2016-03-21 MED ORDER — METOPROLOL TARTRATE 100 MG PO TABS: 100.0000 mg | ORAL_TABLET | Freq: Two times a day (BID) | ORAL | Status: DC

## 2016-03-21 MED ORDER — ATORVASTATIN CALCIUM 40 MG PO TABS: 40.0000 mg | ORAL_TABLET | Freq: Every day | ORAL | Status: DC

## 2016-03-21 MED ORDER — BELVIQ 10 MG PO TABS: 1.0000 | ORAL_TABLET | Freq: Two times a day (BID) | ORAL | Status: DC

## 2016-03-21 MED ORDER — VALSARTAN-HYDROCHLOROTHIAZIDE 320-25 MG PO TABS: 1.0000 | ORAL_TABLET | Freq: Every day | ORAL | Status: DC

## 2016-03-21 NOTE — Progress Notes (Signed)
Name: Hayley Simmons   MRN: [id number]    DOB: November 10, 1945   Date:03/21/2016       Progress Note  Subjective  Chief Complaint  Chief Complaint  Patient presents with  . Follow-up    3 mo  . Medication Refill    Hyperlipidemia This is a chronic problem. The problem is controlled. Recent lipid tests were reviewed and are high. Exacerbating diseases include obesity. Pertinent negatives include no chest pain, leg pain, myalgias or shortness of breath. Current antihyperlipidemic treatment includes statins. Risk factors for coronary artery disease include dyslipidemia.  Hypertension This is a chronic problem. The problem is unchanged. The problem is controlled. Associated symptoms include anxiety. Pertinent negatives include no blurred vision, chest pain, headaches, palpitations or shortness of breath. Past treatments include angiotensin blockers, diuretics and beta blockers. Hypertensive end-organ damage includes kidney disease. There is no history of CAD/MI or CVA.  Anxiety Presents for follow-up visit. The problem has been unchanged. Patient reports no chest pain, excessive worry, insomnia, irritability, nervous/anxious behavior, palpitations, panic or shortness of breath. Symptoms occur most days.   Past treatments include benzodiazephines. The treatment provided moderate relief. Compliance with prior treatments has been good.  Gastroesophageal Reflux She reports no abdominal pain, no belching, no chest pain, no heartburn or no sore throat. This is a chronic problem. The problem has been unchanged. Risk factors include obesity. She has tried a PPI for the symptoms. Past procedures do not include an EGD.     Past Medical History  Diagnosis Date  . Hyperlipidemia   . Hypertension   . Allergy   . Anxiety   . GERD (gastroesophageal reflux disease)     Past Surgical History  Procedure Laterality Date  . Carpal tunnel release Right 07/29/2008  . Abdominal hysterectomy  1994    Family  History  Problem Relation Age of Onset  . Gout Mother   . Dementia Mother   . Kidney disease Mother   . Cancer Mother     kidney  . Heart attack Father     Social History   Social History  . Marital Status: Married    Spouse Name: N/A  . Number of Children: N/A  . Years of Education: N/A   Occupational History  . Not on file.   Social History Main Topics  . Smoking status: Former Research scientist (life sciences)  . Smokeless tobacco: Former Systems developer    Quit date: 04/19/1998  . Alcohol Use: No  . Drug Use: No  . Sexual Activity: No   Other Topics Concern  . Not on file   Social History Narrative     Current outpatient prescriptions:  .  ALPRAZolam (XANAX) 0.25 MG tablet, Take 1 tablet (0.25 mg total) by mouth 2 (two) times daily as needed for anxiety., Disp: 60 tablet, Rfl: 2 .  aspirin 81 MG tablet, Take by mouth., Disp: , Rfl:  .  atorvastatin (LIPITOR) 40 MG tablet, Take 1 tablet (40 mg total) by mouth at bedtime., Disp: 90 tablet, Rfl: 0 .  BELVIQ 10 MG TABS, TK 1 T PO BID., Disp: , Rfl: 2 .  CALCIUM CARBONATE-VIT D-MIN PO, Take by mouth., Disp: , Rfl:  .  metoprolol (LOPRESSOR) 100 MG tablet, Take 1 tablet (100 mg total) by mouth 2 (two) times daily., Disp: 180 tablet, Rfl: 0 .  omeprazole (PRILOSEC) 20 MG capsule, Take 1 capsule (20 mg total) by mouth daily., Disp: 90 capsule, Rfl: 0 .  valsartan-hydrochlorothiazide (DIOVAN-HCT) 320-25 MG tablet, Take 1  tablet by mouth daily., Disp: 90 tablet, Rfl: 0  Allergies  Allergen Reactions  . Cyclosporine Other (See Comments), Nausea And Vomiting and Nausea Only  . Caduet  [Amlodipine-Atorvastatin] Other (See Comments)    Makes Pt feel bad makes pt feel bad  . Escitalopram Other (See Comments)     Review of Systems  Constitutional: Negative for irritability.  HENT: Negative for sore throat.   Eyes: Negative for blurred vision.  Respiratory: Negative for shortness of breath.   Cardiovascular: Negative for chest pain and palpitations.   Gastrointestinal: Negative for heartburn and abdominal pain.  Musculoskeletal: Negative for myalgias.  Neurological: Negative for headaches.  Psychiatric/Behavioral: The patient is not nervous/anxious and does not have insomnia.      Objective  Filed Vitals:   03/21/16 0821  BP: 132/76  Pulse: 63  Temp: 97.7 F (36.5 C)  TempSrc: Oral  Resp: 15  Height: 5\' 1"  (1.549 m)  Weight: 241 lb 8 oz (109.544 kg)  SpO2: 95%    Physical Exam  Constitutional: She is oriented to person, place, and time and well-developed, well-nourished, and in no distress.  HENT:  Head: Normocephalic and atraumatic.  Cardiovascular: Normal rate, regular rhythm and normal heart sounds.   Pulmonary/Chest: Effort normal and breath sounds normal.  Abdominal: Soft. Bowel sounds are normal. There is no tenderness.  Musculoskeletal: She exhibits no edema.  Neurological: She is alert and oriented to person, place, and time.  Nursing note and vitals reviewed.      Assessment & Plan  1. Anxiety Stable on present anxiolytic therapy - ALPRAZolam (XANAX) 0.25 MG tablet; Take 1 tablet (0.25 mg total) by mouth 2 (two) times daily as needed for anxiety.  Dispense: 60 tablet; Refill: 2  2. Essential hypertension BP at goal. Refills provided - metoprolol (LOPRESSOR) 100 MG tablet; Take 1 tablet (100 mg total) by mouth 2 (two) times daily.  Dispense: 180 tablet; Refill: 0 - valsartan-hydrochlorothiazide (DIOVAN-HCT) 320-25 MG tablet; Take 1 tablet by mouth daily.  Dispense: 90 tablet; Refill: 0  3. Gastroesophageal reflux disease, esophagitis presence not specified Symptoms controlled as long as she is on PPI therapy - omeprazole (PRILOSEC) 20 MG capsule; Take 1 capsule (20 mg total) by mouth daily.  Dispense: 90 capsule; Refill: 0  4. Hyperlipidemia Elevated triglycerides, normal total and LDL cholesterol. Recheck and consider starting on additional triglyceride lowering therapy - atorvastatin (LIPITOR) 40  MG tablet; Take 1 tablet (40 mg total) by mouth at bedtime.  Dispense: 90 tablet; Refill: 0 - Lipid Profile  5. Morbid obesity due to excess calories (HCC) We will restart on Belviq 10 mg twice daily for achieving sustained weight loss - BELVIQ 10 MG TABS; Take 1 tablet by mouth 2 (two) times daily.  Dispense: 60 tablet; Refill: 2  6. Hyperglycemia  - POCT HgB A1C    Hayley Simmons [facility] 03/21/2016 8:35 AM

## 2016-03-22 LAB — LIPID PANEL
CHOLESTEROL TOTAL: 169 mg/dL (ref 100–199)
Chol/HDL Ratio: 4.8 ratio units — ABNORMAL HIGH (ref 0.0–4.4)
HDL: 35 mg/dL — ABNORMAL LOW (ref >39)
LDL Calculated: 84 mg/dL (ref 0–99)
Triglycerides: 250 mg/dL — ABNORMAL HIGH (ref 0–149)
VLDL CHOLESTEROL CAL: 50 mg/dL — AB (ref 5–40)

## 2016-04-01 ENCOUNTER — Ambulatory Visit (INDEPENDENT_AMBULATORY_CARE_PROVIDER_SITE_OTHER): Payer: Medicare Other | Admitting: Family Medicine

## 2016-04-01 ENCOUNTER — Encounter: Payer: Self-pay | Admitting: Family Medicine

## 2016-04-01 VITALS — BP 148/70 | HR 60 | Temp 98.1°F | Resp 16 | Ht 61.0 in | Wt 240.6 lb

## 2016-04-01 DIAGNOSIS — E781 Pure hyperglyceridemia: Secondary | ICD-10-CM

## 2016-04-01 NOTE — Progress Notes (Signed)
Name: Hayley Simmons   MRN: [id number]    DOB: [birth year]   Date:04/01/2016       Progress Note  Subjective  Chief Complaint  Chief Complaint  Patient presents with  . Hyperlipidemia    elevated Triglycerides    Hyperlipidemia This is a recurrent problem. The problem is uncontrolled. Recent lipid tests were reviewed and are high (elevated Triglycerides). Exacerbating diseases include obesity. She has no history of diabetes. Pertinent negatives include no chest pain. Current antihyperlipidemic treatment includes statins.    Past Medical History  Diagnosis Date  . Hyperlipidemia   . Hypertension   . Allergy   . Anxiety   . GERD (gastroesophageal reflux disease)     Past Surgical History  Procedure Laterality Date  . Carpal tunnel release Right 07/29/2008  . Abdominal hysterectomy  1994    Family History  Problem Relation Age of Onset  . Gout Mother   . Dementia Mother   . Kidney disease Mother   . Cancer Mother     kidney  . Heart attack Father     Social History   Social History  . Marital Status: Married    Spouse Name: N/A  . Number of Children: N/A  . Years of Education: N/A   Occupational History  . Not on file.   Social History Main Topics  . Smoking status: Former Research scientist (life sciences)  . Smokeless tobacco: Former Systems developer    Quit date: 04/19/1998  . Alcohol Use: No  . Drug Use: No  . Sexual Activity: No   Other Topics Concern  . Not on file   Social History Narrative     Current outpatient prescriptions:  .  ALPRAZolam (XANAX) 0.25 MG tablet, Take 1 tablet (0.25 mg total) by mouth 2 (two) times daily as needed for anxiety., Disp: 60 tablet, Rfl: 2 .  aspirin 81 MG tablet, Take by mouth., Disp: , Rfl:  .  atorvastatin (LIPITOR) 40 MG tablet, Take 1 tablet (40 mg total) by mouth at bedtime., Disp: 90 tablet, Rfl: 0 .  BELVIQ 10 MG TABS, Take 1 tablet by mouth 2 (two) times daily., Disp: 60 tablet, Rfl: 2 .  CALCIUM CARBONATE-VIT D-MIN PO, Take by mouth., Disp:  , Rfl:  .  metoprolol (LOPRESSOR) 100 MG tablet, Take 1 tablet (100 mg total) by mouth 2 (two) times daily., Disp: 180 tablet, Rfl: 0 .  omeprazole (PRILOSEC) 20 MG capsule, Take 1 capsule (20 mg total) by mouth daily., Disp: 90 capsule, Rfl: 0 .  valsartan-hydrochlorothiazide (DIOVAN-HCT) 320-25 MG tablet, Take 1 tablet by mouth daily., Disp: 90 tablet, Rfl: 0  Allergies  Allergen Reactions  . Cyclosporine Other (See Comments), Nausea And Vomiting and Nausea Only  . Caduet  [Amlodipine-Atorvastatin] Other (See Comments)    Makes Pt feel bad makes pt feel bad  . Escitalopram Other (See Comments)     Review of Systems  Constitutional: Negative for fever and chills.  Cardiovascular: Negative for chest pain.      Objective  Filed Vitals:   04/01/16 0946  BP: 148/70  Pulse: 60  Temp: 98.1 F (36.7 C)  TempSrc: Oral  Resp: 16  Height: 5\' 1"  (1.549 m)  Weight: 240 lb 9.6 oz (109.135 kg)  SpO2: 93%    Physical Exam  Constitutional: She is oriented to person, place, and time and well-developed, well-nourished, and in no distress.  HENT:  Head: Normocephalic and atraumatic.  Cardiovascular: Normal rate, regular rhythm and normal heart sounds.   No  murmur heard. Pulmonary/Chest: Effort normal and breath sounds normal.  Neurological: She is alert and oriented to person, place, and time.  Psychiatric: Mood, memory, affect and judgment normal.  Nursing note and vitals reviewed.   Assessment & Plan  1. Hypertriglyceridemia Would like to start patient on Vascepa, however she is concerned that it may not be covered through her insurance. Advised to check with insurance and pharmacy, obtain TSH to rule out hypertriglyceridemia caused by hypothyroidism - TSH     Kirston Luty [facility] 04/01/2016 10:07 AM

## 2016-04-04 ENCOUNTER — Other Ambulatory Visit: Payer: Self-pay | Admitting: Family Medicine

## 2016-04-04 DIAGNOSIS — E781 Pure hyperglyceridemia: Secondary | ICD-10-CM | POA: Diagnosis not present

## 2016-04-04 DIAGNOSIS — R946 Abnormal results of thyroid function studies: Secondary | ICD-10-CM | POA: Diagnosis not present

## 2016-04-05 ENCOUNTER — Telehealth: Payer: Self-pay | Admitting: Emergency Medicine

## 2016-04-05 LAB — TSH: TSH: 5.17 mIU/L — ABNORMAL HIGH

## 2016-04-05 LAB — T4, FREE: FREE T4: 1 ng/dL (ref 0.8–1.8)

## 2016-04-05 NOTE — Telephone Encounter (Signed)
Spoke to [place] at lab. Can add T4 levels to SST serum that is at lab instead of bringing her back.

## 2016-04-08 ENCOUNTER — Telehealth: Payer: Self-pay | Admitting: Emergency Medicine

## 2016-04-08 NOTE — Telephone Encounter (Signed)
Left message for patient on test results and call back

## 2016-05-19 ENCOUNTER — Encounter: Payer: Self-pay | Admitting: Family Medicine

## 2016-05-19 DIAGNOSIS — R7989 Other specified abnormal findings of blood chemistry: Secondary | ICD-10-CM | POA: Insufficient documentation

## 2016-06-21 ENCOUNTER — Ambulatory Visit (INDEPENDENT_AMBULATORY_CARE_PROVIDER_SITE_OTHER): Payer: Medicare Other | Admitting: Family Medicine

## 2016-06-21 ENCOUNTER — Encounter: Payer: Self-pay | Admitting: Family Medicine

## 2016-06-21 VITALS — BP 138/82 | HR 64 | Temp 98.1°F | Wt 241.9 lb

## 2016-06-21 DIAGNOSIS — E785 Hyperlipidemia, unspecified: Secondary | ICD-10-CM

## 2016-06-21 DIAGNOSIS — Z23 Encounter for immunization: Secondary | ICD-10-CM

## 2016-06-21 DIAGNOSIS — Z1231 Encounter for screening mammogram for malignant neoplasm of breast: Secondary | ICD-10-CM

## 2016-06-21 DIAGNOSIS — K219 Gastro-esophageal reflux disease without esophagitis: Secondary | ICD-10-CM

## 2016-06-21 DIAGNOSIS — F419 Anxiety disorder, unspecified: Secondary | ICD-10-CM | POA: Diagnosis not present

## 2016-06-21 DIAGNOSIS — I10 Essential (primary) hypertension: Secondary | ICD-10-CM

## 2016-06-21 DIAGNOSIS — E119 Type 2 diabetes mellitus without complications: Secondary | ICD-10-CM

## 2016-06-21 DIAGNOSIS — R7303 Prediabetes: Secondary | ICD-10-CM | POA: Insufficient documentation

## 2016-06-21 LAB — GLUCOSE, POCT (MANUAL RESULT ENTRY): POC Glucose: 113 mg/dl — AB (ref 70–99)

## 2016-06-21 LAB — POCT GLYCOSYLATED HEMOGLOBIN (HGB A1C): Hemoglobin A1C: 5.8

## 2016-06-21 MED ORDER — ALPRAZOLAM 0.25 MG PO TABS: 0.2500 mg | ORAL_TABLET | Freq: Two times a day (BID) | ORAL | 2 refills | Status: DC | PRN

## 2016-06-21 MED ORDER — VALSARTAN-HYDROCHLOROTHIAZIDE 320-25 MG PO TABS: 1.0000 | ORAL_TABLET | Freq: Every day | ORAL | 0 refills | Status: DC

## 2016-06-21 MED ORDER — ATORVASTATIN CALCIUM 40 MG PO TABS: 40.0000 mg | ORAL_TABLET | Freq: Every day | ORAL | 0 refills | Status: DC

## 2016-06-21 MED ORDER — OMEPRAZOLE 20 MG PO CPDR: 20.0000 mg | DELAYED_RELEASE_CAPSULE | Freq: Every day | ORAL | 0 refills | Status: DC

## 2016-06-21 MED ORDER — METOPROLOL TARTRATE 100 MG PO TABS: 100.0000 mg | ORAL_TABLET | Freq: Two times a day (BID) | ORAL | 0 refills | Status: DC

## 2016-06-21 NOTE — Progress Notes (Signed)
Name: Hayley Simmons   MRN: [id number]    DOB: 1946/09/07   Date:06/21/2016       Progress Note  Subjective  Chief Complaint  Chief Complaint  Patient presents with  . Follow-up    Hyperlipidemia  This is a chronic problem. The problem is controlled. Recent lipid tests were reviewed and are high. Exacerbating diseases include diabetes and obesity. Pertinent negatives include no chest pain, leg pain, myalgias or shortness of breath. Current antihyperlipidemic treatment includes statins. Risk factors for coronary artery disease include dyslipidemia.  Hypertension  This is a chronic problem. The problem is unchanged. The problem is controlled. Associated symptoms include anxiety. Pertinent negatives include no blurred vision, chest pain, headaches, palpitations or shortness of breath. Past treatments include angiotensin blockers, diuretics and beta blockers. Hypertensive end-organ damage includes kidney disease. There is no history of CAD/MI or CVA.  Anxiety  Presents for follow-up visit. The problem has been unchanged. Patient reports no chest pain, excessive worry, insomnia, irritability, nervous/anxious behavior, palpitations, panic or shortness of breath. Symptoms occur most days.   Past treatments include benzodiazephines. The treatment provided moderate relief. Compliance with prior treatments has been good.  Gastroesophageal Reflux  She reports no abdominal pain, no belching, no chest pain, no heartburn or no sore throat. This is a chronic problem. The problem has been unchanged. Risk factors include obesity. She has tried a PPI for the symptoms. Past procedures do not include an EGD.      Past Medical History:  Diagnosis Date  . Allergy   . Anxiety   . GERD (gastroesophageal reflux disease)   . Hyperlipidemia   . Hypertension     Past Surgical History:  Procedure Laterality Date  . ABDOMINAL HYSTERECTOMY  1994  . CARPAL TUNNEL RELEASE Right 07/29/2008    Family History   Problem Relation Age of Onset  . Gout Mother   . Dementia Mother   . Kidney disease Mother   . Cancer Mother     kidney  . Heart attack Father     Social History   Social History  . Marital status: Married    Spouse name: N/A  . Number of children: N/A  . Years of education: N/A   Occupational History  . Not on file.   Social History Main Topics  . Smoking status: Former Research scientist (life sciences)  . Smokeless tobacco: Former Systems developer    Quit date: 04/19/1998  . Alcohol use No  . Drug use: No  . Sexual activity: No   Other Topics Concern  . Not on file   Social History Narrative  . No narrative on file     Current Outpatient Prescriptions:  .  ALPRAZolam (XANAX) 0.25 MG tablet, Take 1 tablet (0.25 mg total) by mouth 2 (two) times daily as needed for anxiety., Disp: 60 tablet, Rfl: 2 .  aspirin 81 MG tablet, Take by mouth., Disp: , Rfl:  .  atorvastatin (LIPITOR) 40 MG tablet, Take 1 tablet (40 mg total) by mouth at bedtime., Disp: 90 tablet, Rfl: 0 .  BELVIQ 10 MG TABS, Take 1 tablet by mouth 2 (two) times daily., Disp: 60 tablet, Rfl: 2 .  CALCIUM CARBONATE-VIT D-MIN PO, Take by mouth., Disp: , Rfl:  .  metoprolol (LOPRESSOR) 100 MG tablet, Take 1 tablet (100 mg total) by mouth 2 (two) times daily., Disp: 180 tablet, Rfl: 0 .  omeprazole (PRILOSEC) 20 MG capsule, Take 1 capsule (20 mg total) by mouth daily., Disp: 90 capsule, Rfl: 0 .  valsartan-hydrochlorothiazide (DIOVAN-HCT) 320-25 MG tablet, Take 1 tablet by mouth daily., Disp: 90 tablet, Rfl: 0  Allergies  Allergen Reactions  . Caduet  [Amlodipine-Atorvastatin] Other (See Comments)    Makes Pt feel bad makes pt feel bad  . Cyclosporine Other (See Comments), Nausea And Vomiting and Nausea Only  . Escitalopram Other (See Comments)     Review of Systems  Constitutional: Negative for irritability.  HENT: Negative for sore throat.   Eyes: Negative for blurred vision.  Respiratory: Negative for shortness of breath.    Cardiovascular: Negative for chest pain and palpitations.  Gastrointestinal: Negative for abdominal pain and heartburn.  Musculoskeletal: Negative for myalgias.  Neurological: Negative for headaches.  Psychiatric/Behavioral: The patient is not nervous/anxious and does not have insomnia.       Objective  Vitals:   06/21/16 0856  BP: 138/82  Pulse: 64  Temp: 98.1 F (36.7 C)  SpO2: 97%  Weight: 241 lb 14.4 oz (109.7 kg)    Physical Exam  Constitutional: She is oriented to person, place, and time and well-developed, well-nourished, and in no distress.  HENT:  Head: Normocephalic and atraumatic.  Cardiovascular: Normal rate, regular rhythm, S1 normal, S2 normal and normal heart sounds.   No murmur heard. Pulmonary/Chest: Effort normal and breath sounds normal. She has no wheezes.  Abdominal: Soft. Bowel sounds are normal. There is no tenderness.  Musculoskeletal: She exhibits no edema.  Neurological: She is alert and oriented to person, place, and time.  Psychiatric: Mood, memory, affect and judgment normal.  Nursing note and vitals reviewed.   Assessment & Plan 1. Anxiety Stable, taking medication as directed. Refills provided. - ALPRAZolam (XANAX) 0.25 MG tablet; Take 1 tablet (0.25 mg total) by mouth 2 (two) times daily as needed for anxiety.  Dispense: 60 tablet; Refill: 2  2. Essential hypertension BP stable and controlled on present antihypertensive therapy - metoprolol (LOPRESSOR) 100 MG tablet; Take 1 tablet (100 mg total) by mouth 2 (two) times daily.  Dispense: 180 tablet; Refill: 0 - valsartan-hydrochlorothiazide (DIOVAN-HCT) 320-25 MG tablet; Take 1 tablet by mouth daily.  Dispense: 90 tablet; Refill: 0  3. Gastroesophageal reflux disease, esophagitis presence not specified  - omeprazole (PRILOSEC) 20 MG capsule; Take 1 capsule (20 mg total) by mouth daily.  Dispense: 90 capsule; Refill: 0  4. Hyperlipidemia, unspecified hyperlipidemia type  - atorvastatin  (LIPITOR) 40 MG tablet; Take 1 tablet (40 mg total) by mouth at bedtime.  Dispense: 90 tablet; Refill: 0 - Lipid Profile - COMPLETE METABOLIC PANEL WITH GFR  5. Controlled type 2 diabetes mellitus without complication, without long-term current use of insulin (HCC) Hemoglobin A1c is at goal at 5.8%, diabetes is controlled with diet, no pharmacotherapy needed at this point - POCT HgB A1C - POCT Glucose (CBG)  6. Screening mammogram, encounter for  - MM Digital Screening; Future  7. Need for influenza vaccination  - Flu vaccine HIGH DOSE PF (Fluzone High dose)   Shepard Keltz [facility] 06/21/2016 9:17 AM

## 2016-06-22 LAB — LIPID PANEL
CHOL/HDL RATIO: 4.9 ratio (ref <=5.0)
CHOLESTEROL: 165 mg/dL (ref 125–200)
HDL: 34 mg/dL — AB (ref >=46)
LDL Cholesterol: 92 mg/dL (ref <130)
TRIGLYCERIDES: 194 mg/dL — AB (ref <150)
VLDL: 39 mg/dL — ABNORMAL HIGH (ref <30)

## 2016-06-22 LAB — COMPLETE METABOLIC PANEL WITH GFR
ALBUMIN: 3.9 g/dL (ref 3.6–5.1)
ALK PHOS: 128 U/L (ref 33–130)
ALT: 14 U/L (ref 6–29)
AST: 15 U/L (ref 10–35)
BILIRUBIN TOTAL: 0.4 mg/dL (ref 0.2–1.2)
BUN: 17 mg/dL (ref 7–25)
CALCIUM: 9.2 mg/dL (ref 8.6–10.4)
CO2: 30 mmol/L (ref 20–31)
Chloride: 103 mmol/L (ref 98–110)
Creat: 0.93 mg/dL (ref 0.50–0.99)
GFR, EST AFRICAN AMERICAN: 74 mL/min (ref >=60)
GFR, EST NON AFRICAN AMERICAN: 64 mL/min (ref >=60)
GLUCOSE: 110 mg/dL — AB (ref 65–99)
POTASSIUM: 4.3 mmol/L (ref 3.5–5.3)
SODIUM: 141 mmol/L (ref 135–146)
TOTAL PROTEIN: 6.8 g/dL (ref 6.1–8.1)

## 2016-06-27 DIAGNOSIS — Z1231 Encounter for screening mammogram for malignant neoplasm of breast: Secondary | ICD-10-CM | POA: Diagnosis not present

## 2016-09-21 ENCOUNTER — Encounter: Payer: Self-pay | Admitting: Family Medicine

## 2016-09-21 ENCOUNTER — Ambulatory Visit (INDEPENDENT_AMBULATORY_CARE_PROVIDER_SITE_OTHER): Payer: Medicare Other | Admitting: Family Medicine

## 2016-09-21 DIAGNOSIS — F419 Anxiety disorder, unspecified: Secondary | ICD-10-CM

## 2016-09-21 DIAGNOSIS — E785 Hyperlipidemia, unspecified: Secondary | ICD-10-CM

## 2016-09-21 DIAGNOSIS — K219 Gastro-esophageal reflux disease without esophagitis: Secondary | ICD-10-CM | POA: Diagnosis not present

## 2016-09-21 DIAGNOSIS — I10 Essential (primary) hypertension: Secondary | ICD-10-CM

## 2016-09-21 MED ORDER — ALPRAZOLAM 0.25 MG PO TABS: 0.2500 mg | ORAL_TABLET | Freq: Two times a day (BID) | ORAL | 2 refills | Status: DC | PRN

## 2016-09-21 MED ORDER — BELVIQ 10 MG PO TABS: 1.0000 | ORAL_TABLET | Freq: Two times a day (BID) | ORAL | 2 refills | Status: DC

## 2016-09-21 MED ORDER — OMEPRAZOLE 20 MG PO CPDR: 20.0000 mg | DELAYED_RELEASE_CAPSULE | Freq: Every day | ORAL | 0 refills | Status: DC

## 2016-09-21 MED ORDER — VALSARTAN-HYDROCHLOROTHIAZIDE 320-25 MG PO TABS: 1.0000 | ORAL_TABLET | Freq: Every day | ORAL | 0 refills | Status: DC

## 2016-09-21 MED ORDER — ATORVASTATIN CALCIUM 40 MG PO TABS: 40.0000 mg | ORAL_TABLET | Freq: Every day | ORAL | 0 refills | Status: DC

## 2016-09-21 MED ORDER — METOPROLOL TARTRATE 100 MG PO TABS: 100.0000 mg | ORAL_TABLET | Freq: Two times a day (BID) | ORAL | 0 refills | Status: DC

## 2016-09-21 NOTE — Progress Notes (Signed)
Name: Hayley Simmons   MRN: [id number]    DOB: [birth year]   Date:09/21/2016       Progress Note  Subjective  Chief Complaint  Chief Complaint  Patient presents with  . Hypertension    3 month follow up, medication refills  . Hyperlipidemia  . Anxiety    Hypertension  This is a chronic problem. The problem is unchanged. The problem is controlled. Associated symptoms include anxiety. Pertinent negatives include no blurred vision, chest pain, headaches, palpitations or shortness of breath. Past treatments include angiotensin blockers, diuretics and beta blockers. Hypertensive end-organ damage includes kidney disease. There is no history of CAD/MI or CVA.  Hyperlipidemia  This is a chronic problem. The problem is controlled. Recent lipid tests were reviewed and are high. Exacerbating diseases include obesity. Pertinent negatives include no chest pain, leg pain, myalgias or shortness of breath. Current antihyperlipidemic treatment includes statins. Risk factors for coronary artery disease include dyslipidemia.  Anxiety  Presents for follow-up visit. Patient reports no chest pain, excessive worry, insomnia, irritability, nervous/anxious behavior, palpitations, panic or shortness of breath. Symptoms occur most days.    Gastroesophageal Reflux  She reports no abdominal pain, no belching, no chest pain, no heartburn or no sore throat. This is a chronic problem. The problem has been unchanged. Risk factors include obesity. She has tried a PPI for the symptoms. Past procedures do not include an EGD.     Past Medical History:  Diagnosis Date  . Allergy   . Anxiety   . GERD (gastroesophageal reflux disease)   . Hyperlipidemia   . Hypertension     Past Surgical History:  Procedure Laterality Date  . ABDOMINAL HYSTERECTOMY  1994  . CARPAL TUNNEL RELEASE Right 07/29/2008    Family History  Problem Relation Age of Onset  . Gout Mother   . Dementia Mother   . Kidney disease Mother   .  Cancer Mother     kidney  . Heart attack Father     Social History   Social History  . Marital status: Married    Spouse name: N/A  . Number of children: N/A  . Years of education: N/A   Occupational History  . Not on file.   Social History Main Topics  . Smoking status: Former Research scientist (life sciences)  . Smokeless tobacco: Former Systems developer    Quit date: 04/19/1998  . Alcohol use No  . Drug use: No  . Sexual activity: No   Other Topics Concern  . Not on file   Social History Narrative  . No narrative on file     Current Outpatient Prescriptions:  .  ALPRAZolam (XANAX) 0.25 MG tablet, Take 1 tablet (0.25 mg total) by mouth 2 (two) times daily as needed for anxiety., Disp: 60 tablet, Rfl: 2 .  aspirin 81 MG tablet, Take by mouth., Disp: , Rfl:  .  atorvastatin (LIPITOR) 40 MG tablet, Take 1 tablet (40 mg total) by mouth at bedtime., Disp: 90 tablet, Rfl: 0 .  BELVIQ 10 MG TABS, Take 1 tablet by mouth 2 (two) times daily., Disp: 60 tablet, Rfl: 2 .  CALCIUM CARBONATE-VIT D-MIN PO, Take by mouth., Disp: , Rfl:  .  metoprolol (LOPRESSOR) 100 MG tablet, Take 1 tablet (100 mg total) by mouth 2 (two) times daily., Disp: 180 tablet, Rfl: 0 .  omeprazole (PRILOSEC) 20 MG capsule, Take 1 capsule (20 mg total) by mouth daily., Disp: 90 capsule, Rfl: 0 .  valsartan-hydrochlorothiazide (DIOVAN-HCT) 320-25 MG tablet, Take 1  tablet by mouth daily., Disp: 90 tablet, Rfl: 0  Allergies  Allergen Reactions  . Caduet  [Amlodipine-Atorvastatin] Other (See Comments)    Makes Pt feel bad makes pt feel bad  . Cyclosporine Other (See Comments), Nausea And Vomiting and Nausea Only  . Escitalopram Other (See Comments)     Review of Systems  Constitutional: Negative for irritability.  HENT: Negative for sore throat.   Eyes: Negative for blurred vision.  Respiratory: Negative for shortness of breath.   Cardiovascular: Negative for chest pain and palpitations.  Gastrointestinal: Negative for abdominal pain and  heartburn.  Musculoskeletal: Negative for myalgias.  Neurological: Negative for headaches.  Psychiatric/Behavioral: The patient is not nervous/anxious and does not have insomnia.      Objective  Vitals:   09/21/16 0834  BP: 128/64  Pulse: 64  Resp: 16  Temp: 98.2 F (36.8 C)  TempSrc: Oral  SpO2: 94%  Weight: 235 lb (106.6 kg)  Height: 5\' 1"  (1.549 m)    Physical Exam  Constitutional: She is oriented to person, place, and time and well-developed, well-nourished, and in no distress.  HENT:  Head: Normocephalic and atraumatic.  Cardiovascular: Normal rate, regular rhythm and normal heart sounds.   Pulmonary/Chest: Effort normal and breath sounds normal.  Abdominal: Soft. Bowel sounds are normal. There is no tenderness.  Musculoskeletal: She exhibits no edema.  Neurological: She is alert and oriented to person, place, and time.  Psychiatric: Mood, memory, affect and judgment normal.  Nursing note and vitals reviewed.    Assessment & Plan  1. Anxiety  - ALPRAZolam (XANAX) 0.25 MG tablet; Take 1 tablet (0.25 mg total) by mouth 2 (two) times daily as needed for anxiety.  Dispense: 60 tablet; Refill: 2  2. Essential hypertension  - valsartan-hydrochlorothiazide (DIOVAN-HCT) 320-25 MG tablet; Take 1 tablet by mouth daily.  Dispense: 90 tablet; Refill: 0 - metoprolol (LOPRESSOR) 100 MG tablet; Take 1 tablet (100 mg total) by mouth 2 (two) times daily.  Dispense: 180 tablet; Refill: 0  3. Gastroesophageal reflux disease, esophagitis presence not specified  - omeprazole (PRILOSEC) 20 MG capsule; Take 1 capsule (20 mg total) by mouth daily.  Dispense: 90 capsule; Refill: 0  4. Hyperlipidemia, unspecified hyperlipidemia type  - atorvastatin (LIPITOR) 40 MG tablet; Take 1 tablet (40 mg total) by mouth at bedtime.  Dispense: 90 tablet; Refill: 0  5. Morbid obesity due to excess calories (HCC)  - BELVIQ 10 MG TABS; Take 1 tablet by mouth 2 (two) times daily.  Dispense: 60  tablet; Refill: 2   Winslow Ederer [facility] 09/21/2016 8:47 AM

## 2016-11-07 ENCOUNTER — Ambulatory Visit (INDEPENDENT_AMBULATORY_CARE_PROVIDER_SITE_OTHER): Payer: Medicare Other

## 2016-11-07 VITALS — BP 124/84 | HR 60 | Temp 97.9°F | Ht 61.0 in | Wt 235.0 lb

## 2016-11-07 DIAGNOSIS — Z0000 Encounter for general adult medical examination without abnormal findings: Secondary | ICD-10-CM | POA: Diagnosis not present

## 2016-11-07 NOTE — Addendum Note (Signed)
Addended by: Fabio Neighbors A on: 11/07/2016 03:44 PM   Modules accepted: Miquel Dunn

## 2016-11-07 NOTE — Patient Instructions (Signed)
Health Maintenance, Female Introduction Adopting a healthy lifestyle and getting preventive care can go a long way to promote health and wellness. Talk with your health care provider about what schedule of regular examinations is right for you. This is a good chance for you to check in with your provider about disease prevention and staying healthy. In between checkups, there are plenty of things you can do on your own. Experts have done a lot of research about which lifestyle changes and preventive measures are most likely to keep you healthy. Ask your health care provider for more information. Weight and diet Eat a healthy diet  Be sure to include plenty of vegetables, fruits, low-fat dairy products, and lean protein.  Do not eat a lot of foods high in solid fats, added sugars, or salt.  Get regular exercise. This is one of the most important things you can do for your health.  Most adults should exercise for at least 150 minutes each week. The exercise should increase your heart rate and make you sweat (moderate-intensity exercise).  Most adults should also do strengthening exercises at least twice a week. This is in addition to the moderate-intensity exercise. Maintain a healthy weight  Body mass index (BMI) is a measurement that can be used to identify possible weight problems. It estimates body fat based on height and weight. Your health care provider can help determine your BMI and help you achieve or maintain a healthy weight.  For females 4 years of age and older:  A BMI below 18.5 is considered underweight.  A BMI of 18.5 to 24.9 is normal.  A BMI of 25 to 29.9 is considered overweight.  A BMI of 30 and above is considered obese. Watch levels of cholesterol and blood lipids  You should start having your blood tested for lipids and cholesterol at 69 years of age, then have this test every 5 years.  You may need to have your cholesterol levels checked more often  if:  Your lipid or cholesterol levels are high.  You are older than 69 years of age.  You are at high risk for heart disease. Cancer screening Lung Cancer  Lung cancer screening is recommended for adults 12-31 years old who are at high risk for lung cancer because of a history of smoking.  A yearly low-dose CT scan of the lungs is recommended for people who:  Currently smoke.  Have quit within the past 15 years.  Have at least a 30-pack-year history of smoking. A pack year is smoking an average of one pack of cigarettes a day for 1 year.  Yearly screening should continue until it has been 15 years since you quit.  Yearly screening should stop if you develop a health problem that would prevent you from having lung cancer treatment. Breast Cancer  Practice breast self-awareness. This means understanding how your breasts normally appear and feel.  It also means doing regular breast self-exams. Let your health care provider know about any changes, no matter how small.  If you are in your 20s or 30s, you should have a clinical breast exam (CBE) by a health care provider every 1-3 years as part of a regular health exam.  If you are 74 or older, have a CBE every year. Also consider having a breast X-ray (mammogram) every year.  If you have a family history of breast cancer, talk to your health care provider about genetic screening.  If you are at high risk for breast cancer,  talk to your health care provider about having an MRI and a mammogram every year.  Breast cancer gene (BRCA) assessment is recommended for women who have family members with BRCA-related cancers. BRCA-related cancers include:  Breast.  Ovarian.  Tubal.  Peritoneal cancers.  Results of the assessment will determine the need for genetic counseling and BRCA1 and BRCA2 testing. Colorectal Cancer  This type of cancer can be detected and often prevented.  Routine colorectal cancer screening usually begins  at 69 years of age and continues through 69 years of age.  Your health care provider may recommend screening at an earlier age if you have risk factors for colon cancer.  Your health care provider may also recommend using home test kits to check for hidden blood in the stool.  A small camera at the end of a tube can be used to examine your colon directly (sigmoidoscopy or colonoscopy). This is done to check for the earliest forms of colorectal cancer.  Routine screening usually begins at age 50.  Direct examination of the colon should be repeated every 5-10 years through 69 years of age. However, you may need to be screened more often if early forms of precancerous polyps or small growths are found. Skin Cancer  Check your skin from head to toe regularly.  Tell your health care provider about any new moles or changes in moles, especially if there is a change in a mole's shape or color.  Also tell your health care provider if you have a mole that is larger than the size of a pencil eraser.  Always use sunscreen. Apply sunscreen liberally and repeatedly throughout the day.  Protect yourself by wearing long sleeves, pants, a wide-brimmed hat, and sunglasses whenever you are outside. Heart disease, diabetes, and high blood pressure  High blood pressure causes heart disease and increases the risk of stroke. High blood pressure is more likely to develop in:  People who have blood pressure in the high end of the normal range (130-139/85-89 mm Hg).  People who are overweight or obese.  People who are African American.  If you are 18-39 years of age, have your blood pressure checked every 3-5 years. If you are 40 years of age or older, have your blood pressure checked every year. You should have your blood pressure measured twice-once when you are at a hospital or clinic, and once when you are not at a hospital or clinic. Record the average of the two measurements. To check your blood pressure  when you are not at a hospital or clinic, you can use:  An automated blood pressure machine at a pharmacy.  A home blood pressure monitor.  If you are between 55 years and 79 years old, ask your health care provider if you should take aspirin to prevent strokes.  Have regular diabetes screenings. This involves taking a blood sample to check your fasting blood sugar level.  If you are at a normal weight and have a low risk for diabetes, have this test once every three years after 69 years of age.  If you are overweight and have a high risk for diabetes, consider being tested at a younger age or more often. Preventing infection Hepatitis B  If you have a higher risk for hepatitis B, you should be screened for this virus. You are considered at high risk for hepatitis B if:  You were born in a country where hepatitis B is common. Ask your health care provider which countries are   considered high risk.  Your parents were born in a high-risk country, and you have not been immunized against hepatitis B (hepatitis B vaccine).  You have HIV or AIDS.  You use needles to inject street drugs.  You live with someone who has hepatitis B.  You have had sex with someone who has hepatitis B.  You get hemodialysis treatment.  You take certain medicines for conditions, including cancer, organ transplantation, and autoimmune conditions. Hepatitis C  Blood testing is recommended for:  Everyone born from [birth year] through 1963.  Anyone with known risk factors for hepatitis C. Pregnancy  If you are premenopausal and you may become pregnant, ask your health care provider about preconception counseling.  If you may become pregnant, take 400 to 800 micrograms (mcg) of folic acid every day.  If you want to prevent pregnancy, talk to your health care provider about birth control (contraception). Osteoporosis and menopause  Osteoporosis is a disease in which the bones lose minerals and strength with  aging. This can result in serious bone fractures. Your risk for osteoporosis can be identified using a bone density scan.  If you are 65 years of age or older, or if you are at risk for osteoporosis and fractures, ask your health care provider if you should be screened.  Ask your health care provider whether you should take a calcium or vitamin D supplement to lower your risk for osteoporosis.  Menopause may have certain physical symptoms and risks.  Hormone replacement therapy may reduce some of these symptoms and risks. Talk to your health care provider about whether hormone replacement therapy is right for you. Follow these instructions at home:  Schedule regular health, dental, and eye exams.  Stay current with your immunizations.  Do not use any tobacco products including cigarettes, chewing tobacco, or electronic cigarettes.  If you are pregnant, do not drink alcohol.  If you are breastfeeding, limit how much and how often you drink alcohol.  Limit alcohol intake to no more than 1 drink per day for nonpregnant women. One drink equals 12 ounces of beer, 5 ounces of wine, or 1 ounces of hard liquor.  Do not use street drugs.  Do not share needles.  Ask your health care provider for help if you need support or information about quitting drugs.  Tell your health care provider if you often feel depressed.  Tell your health care provider if you have ever been abused or do not feel safe at home. This information is not intended to replace advice given to you by your health care provider. Make sure you discuss any questions you have with your health care provider. Document Released: 03/21/2011 Document Revised: 02/11/2016 Document Reviewed: 06/09/2015  2017 Elsevier

## 2016-11-07 NOTE — Progress Notes (Signed)
Subjective:   Hayley Simmons is a 71 y.o. female who presents for Medicare Annual (Subsequent) preventive examination.  Review of Systems:  N/A  Cardiac Risk Factors include: advanced age (>5men, >17 women);dyslipidemia;hypertension;obesity (BMI >30kg/m2)     Objective:     Vitals: BP 124/84 (BP Location: Left Arm)   Pulse 60   Temp 97.9 F (36.6 C) (Oral)   Ht 5\' 1"  (1.549 m)   Wt 235 lb (106.6 kg)   BMI 44.40 kg/m   Body mass index is 44.4 kg/m.   Tobacco History  Smoking Status  . Former Smoker  . Types: Cigarettes  Smokeless Tobacco  . Never Used    Comment: quit in 11/1997     Counseling given: Not Answered   Past Medical History:  Diagnosis Date  . Allergy   . Anxiety   . GERD (gastroesophageal reflux disease)   . Hyperlipidemia   . Hypertension    Past Surgical History:  Procedure Laterality Date  . ABDOMINAL HYSTERECTOMY  1994  . CARPAL TUNNEL RELEASE Right 07/29/2008   Family History  Problem Relation Age of Onset  . Gout Mother   . Dementia Mother   . Kidney disease Mother   . Cancer Mother     kidney  . Heart attack Father    History  Sexual Activity  . Sexual activity: No    Outpatient Encounter Prescriptions as of 11/07/2016  Medication Sig  . ALPRAZolam (XANAX) 0.25 MG tablet Take 1 tablet (0.25 mg total) by mouth 2 (two) times daily as needed for anxiety.  Marland Kitchen aspirin 81 MG tablet Take by mouth.  Marland Kitchen atorvastatin (LIPITOR) 40 MG tablet Take 1 tablet (40 mg total) by mouth at bedtime.  Marland Kitchen BELVIQ 10 MG TABS Take 1 tablet by mouth 2 (two) times daily. (Patient taking differently: Take 1 tablet by mouth daily. )  . CALCIUM CARBONATE-VIT D-MIN PO Take by mouth.  . metoprolol (LOPRESSOR) 100 MG tablet Take 1 tablet (100 mg total) by mouth 2 (two) times daily.  Marland Kitchen omeprazole (PRILOSEC) 20 MG capsule Take 1 capsule (20 mg total) by mouth daily. (Patient taking differently: Take 20 mg by mouth daily. )  . valsartan-hydrochlorothiazide  (DIOVAN-HCT) 320-25 MG tablet Take 1 tablet by mouth daily.   No facility-administered encounter medications on file as of 11/07/2016.     Activities of Daily Living In your present state of health, do you have any difficulty performing the following activities: 11/07/2016 06/21/2016  Hearing? N N  Vision? N Y  Difficulty concentrating or making decisions? N N  Walking or climbing stairs? Y Y  Dressing or bathing? N N  Doing errands, shopping? N N  Preparing Food and eating ? N -  Using the Toilet? N -  In the past six months, have you accidently leaked urine? N -  Do you have problems with loss of bowel control? N -  Managing your Medications? N -  Managing your Finances? N -  Housekeeping or managing your Housekeeping? N -  Some recent data might be hidden    Patient Care Team: Roselee Nova, MD as PCP - General (Family Medicine)    Assessment:     Exercise Activities and Dietary recommendations Current Exercise Habits: The patient does not participate in regular exercise at present, Exercise limited by: Other - see comments (arthritis)  Goals    . Increase water intake          Starting 11/07/16, I will increase  my water intake to 6 glasses a day.      Fall Risk Fall Risk  11/07/2016 06/21/2016 04/01/2016 03/21/2016 12/21/2015  Falls in the past year? No No No No No   Depression Screen PHQ 2/9 Scores 11/07/2016 06/21/2016 04/01/2016 03/21/2016  PHQ - 2 Score 0 0 0 0     Cognitive Function     6CIT Screen 11/07/2016  What Year? 0 points  What month? 0 points  What time? 0 points  Count back from 20 0 points  Months in reverse 0 points  Repeat phrase 0 points  Total Score 0    Immunization History  Administered Date(s) Administered  . Influenza, High Dose Seasonal PF 06/24/2015, 06/21/2016  . Influenza-Unspecified 06/19/2014   Screening Tests Health Maintenance  Topic Date Due  . FOOT EXAM  09/19/2017 (Originally 06/26/1956)  . OPHTHALMOLOGY EXAM  09/19/2017  (Originally 06/26/1956)  . COLONOSCOPY  09/19/2017 (Originally 06/26/1998)  . DEXA SCAN  09/19/2026 (Originally 06/26/2013)  . Hepatitis C Screening  09/19/2026 (Originally [birth year])  . HEMOGLOBIN A1C  12/20/2016  . MAMMOGRAM  06/19/2018  . TETANUS/TDAP  04/19/2022  . INFLUENZA VACCINE  Completed  . ZOSTAVAX  Completed  . PNA vac Low Risk Adult  Completed      Plan:  I have personally reviewed and addressed the Medicare Annual Wellness questionnaire and have noted the following in the patient's chart:  A. Medical and social history B. Use of alcohol, tobacco or illicit drugs  C. Current medications and supplements D. Functional ability and status E.  Nutritional status F.  Physical activity G. Advance directives H. List of other physicians I.  Hospitalizations, surgeries, and ER visits in previous 12 months J.  [place] such as hearing and vision if needed, cognitive and depression L. Referrals and appointments - none  In addition, I have reviewed and discussed with patient certain preventive protocols, quality metrics, and best practice recommendations. A written personalized care plan for preventive services as well as general preventive health recommendations were provided to patient.  See attached scanned questionnaire for additional information.   Signed,  Fabio Neighbors, LPN Nurse Health Advisor   MD Recommendations: None. Pt declines colonoscopy, DEXA screening, foot exam and eye exam.  I, as supervising physician, have reviewed the nurse health advisor's Medicare Wellness Visit note for this patient and concur with the findings and recommendations listed above.  Signed Syed Asad A. Manuella Ghazi MD Attending Physician.

## 2016-12-20 ENCOUNTER — Ambulatory Visit: Payer: Medicare Other | Admitting: Family Medicine

## 2016-12-27 ENCOUNTER — Ambulatory Visit (INDEPENDENT_AMBULATORY_CARE_PROVIDER_SITE_OTHER): Payer: Medicare Other | Admitting: Family Medicine

## 2016-12-27 ENCOUNTER — Encounter: Payer: Self-pay | Admitting: Family Medicine

## 2016-12-27 VITALS — BP 124/82 | HR 64 | Temp 98.6°F | Resp 16 | Ht 61.0 in | Wt 229.2 lb

## 2016-12-27 DIAGNOSIS — E785 Hyperlipidemia, unspecified: Secondary | ICD-10-CM

## 2016-12-27 DIAGNOSIS — F419 Anxiety disorder, unspecified: Secondary | ICD-10-CM

## 2016-12-27 DIAGNOSIS — I10 Essential (primary) hypertension: Secondary | ICD-10-CM | POA: Diagnosis not present

## 2016-12-27 DIAGNOSIS — K219 Gastro-esophageal reflux disease without esophagitis: Secondary | ICD-10-CM | POA: Diagnosis not present

## 2016-12-27 MED ORDER — VALSARTAN-HYDROCHLOROTHIAZIDE 320-25 MG PO TABS: 1.0000 | ORAL_TABLET | Freq: Every day | ORAL | 0 refills | Status: DC

## 2016-12-27 MED ORDER — ATORVASTATIN CALCIUM 40 MG PO TABS: 40.0000 mg | ORAL_TABLET | Freq: Every day | ORAL | 0 refills | Status: DC

## 2016-12-27 MED ORDER — METOPROLOL TARTRATE 100 MG PO TABS: 100.0000 mg | ORAL_TABLET | Freq: Two times a day (BID) | ORAL | 0 refills | Status: DC

## 2016-12-27 MED ORDER — OMEPRAZOLE 20 MG PO CPDR: 20.0000 mg | DELAYED_RELEASE_CAPSULE | Freq: Every day | ORAL | 0 refills | Status: DC

## 2016-12-27 MED ORDER — ALPRAZOLAM 0.25 MG PO TABS: 0.2500 mg | ORAL_TABLET | Freq: Two times a day (BID) | ORAL | 2 refills | Status: DC | PRN

## 2016-12-27 NOTE — Progress Notes (Signed)
Name: Hayley Simmons   MRN: [id number]    DOB: [birth year]   Date:12/27/2016       Progress Note  Subjective  Chief Complaint  Chief Complaint  Patient presents with  . Follow-up  . Medication Refill    Hyperlipidemia  This is a chronic problem. The problem is controlled. Recent lipid tests were reviewed and are high. Exacerbating diseases include obesity. Pertinent negatives include no chest pain, leg pain, myalgias or shortness of breath. Current antihyperlipidemic treatment includes statins. Risk factors for coronary artery disease include dyslipidemia.  Hypertension  This is a chronic problem. The problem is unchanged. The problem is controlled. Associated symptoms include anxiety. Pertinent negatives include no blurred vision, chest pain, headaches, palpitations or shortness of breath. Past treatments include angiotensin blockers, diuretics and beta blockers. Hypertensive end-organ damage includes kidney disease (no longer seeing Nephrologist). There is no history of CAD/MI or CVA.  Anxiety  Presents for follow-up visit. The problem has been unchanged. Patient reports no chest pain, excessive worry, insomnia, irritability, palpitations, panic or shortness of breath. Symptoms occur most days.   Past treatments include benzodiazephines. The treatment provided moderate relief. Compliance with prior treatments has been good.  Gastroesophageal Reflux  She reports no abdominal pain, no belching, no chest pain, no heartburn or no sore throat. This is a chronic problem. The problem has been unchanged. Risk factors include obesity. She has tried a PPI for the symptoms. Past procedures do not include an EGD.     Past Medical History:  Diagnosis Date  . Allergy   . Anxiety   . GERD (gastroesophageal reflux disease)   . Hyperlipidemia   . Hypertension     Past Surgical History:  Procedure Laterality Date  . ABDOMINAL HYSTERECTOMY  1994  . CARPAL TUNNEL RELEASE Right 07/29/2008     Family History  Problem Relation Age of Onset  . Gout Mother   . Dementia Mother   . Kidney disease Mother   . Cancer Mother     kidney  . Heart attack Father     Social History   Social History  . Marital status: Married    Spouse name: N/A  . Number of children: N/A  . Years of education: N/A   Occupational History  . Not on file.   Social History Main Topics  . Smoking status: Former Smoker    Types: Cigarettes  . Smokeless tobacco: Never Used     Comment: quit in 11/1997  . Alcohol use No  . Drug use: No  . Sexual activity: No   Other Topics Concern  . Not on file   Social History Narrative  . No narrative on file     Current Outpatient Prescriptions:  .  ALPRAZolam (XANAX) 0.25 MG tablet, Take 1 tablet (0.25 mg total) by mouth 2 (two) times daily as needed for anxiety., Disp: 60 tablet, Rfl: 2 .  aspirin 81 MG tablet, Take by mouth., Disp: , Rfl:  .  atorvastatin (LIPITOR) 40 MG tablet, Take 1 tablet (40 mg total) by mouth at bedtime., Disp: 90 tablet, Rfl: 0 .  BELVIQ 10 MG TABS, Take 1 tablet by mouth 2 (two) times daily. (Patient taking differently: Take 1 tablet by mouth daily. ), Disp: 60 tablet, Rfl: 2 .  CALCIUM CARBONATE-VIT D-MIN PO, Take by mouth., Disp: , Rfl:  .  metoprolol (LOPRESSOR) 100 MG tablet, Take 1 tablet (100 mg total) by mouth 2 (two) times daily., Disp: 180 tablet, Rfl: 0 .  omeprazole (PRILOSEC) 20 MG capsule, Take 1 capsule (20 mg total) by mouth daily. (Patient taking differently: Take 20 mg by mouth daily. ), Disp: 90 capsule, Rfl: 0 .  valsartan-hydrochlorothiazide (DIOVAN-HCT) 320-25 MG tablet, Take 1 tablet by mouth daily., Disp: 90 tablet, Rfl: 0  Allergies  Allergen Reactions  . Caduet  [Amlodipine-Atorvastatin] Other (See Comments)    Makes Pt feel bad makes pt feel bad  . Cyclosporine Other (See Comments), Nausea And Vomiting and Nausea Only  . Escitalopram Other (See Comments)     Review of Systems   Constitutional: Negative for irritability.  HENT: Negative for sore throat.   Eyes: Negative for blurred vision.  Respiratory: Negative for shortness of breath.   Cardiovascular: Negative for chest pain and palpitations.  Gastrointestinal: Negative for abdominal pain and heartburn.  Musculoskeletal: Negative for myalgias.  Neurological: Negative for headaches.  Psychiatric/Behavioral: The patient does not have insomnia.       Objective  Vitals:   12/27/16 1132  BP: 124/82  Pulse: 64  Resp: 16  Temp: 98.6 F (37 C)  TempSrc: Oral  SpO2: 96%  Weight: 229 lb 3.2 oz (104 kg)  Height: 5\' 1"  (1.549 m)    Physical Exam  Constitutional: She is oriented to person, place, and time and well-developed, well-nourished, and in no distress.  HENT:  Head: Normocephalic and atraumatic.  Cardiovascular: Normal rate, regular rhythm, S1 normal, S2 normal and normal heart sounds.   No murmur heard. Pulmonary/Chest: Effort normal and breath sounds normal. She has no wheezes.  Abdominal: Soft. Bowel sounds are normal. There is no tenderness.  Musculoskeletal: She exhibits no edema.  Neurological: She is alert and oriented to person, place, and time.  Psychiatric: Mood, memory, affect and judgment normal.  Nursing note and vitals reviewed.    Assessment & Plan  1. Anxiety Stable, responsive to alprazolam taken twice a day when necessary - ALPRAZolam (XANAX) 0.25 MG tablet; Take 1 tablet (0.25 mg total) by mouth 2 (two) times daily as needed for anxiety.  Dispense: 60 tablet; Refill: 2  2. Essential hypertension  - metoprolol (LOPRESSOR) 100 MG tablet; Take 1 tablet (100 mg total) by mouth 2 (two) times daily.  Dispense: 180 tablet; Refill: 0 - valsartan-hydrochlorothiazide (DIOVAN-HCT) 320-25 MG tablet; Take 1 tablet by mouth daily.  Dispense: 90 tablet; Refill: 0  3. Hyperlipidemia, unspecified hyperlipidemia type  - atorvastatin (LIPITOR) 40 MG tablet; Take 1 tablet (40 mg total)  by mouth at bedtime.  Dispense: 90 tablet; Refill: 0 - COMPLETE METABOLIC PANEL WITH GFR - Lipid panel  4. Gastroesophageal reflux disease, esophagitis presence not specified  - omeprazole (PRILOSEC) 20 MG capsule; Take 1 capsule (20 mg total) by mouth daily.  Dispense: 90 capsule; Refill: 0   Electa Sterry [facility] 12/27/2016 11:44 AM

## 2016-12-28 LAB — COMPLETE METABOLIC PANEL WITH GFR
ALT: 12 U/L (ref 6–29)
AST: 15 U/L (ref 10–35)
Albumin: 3.9 g/dL (ref 3.6–5.1)
Alkaline Phosphatase: 105 U/L (ref 33–130)
BILIRUBIN TOTAL: 0.5 mg/dL (ref 0.2–1.2)
BUN: 20 mg/dL (ref 7–25)
CO2: 28 mmol/L (ref 20–31)
Calcium: 9.1 mg/dL (ref 8.6–10.4)
Chloride: 103 mmol/L (ref 98–110)
Creat: 1 mg/dL — ABNORMAL HIGH (ref 0.50–0.99)
GFR, EST AFRICAN AMERICAN: 67 mL/min (ref >=60)
GFR, EST NON AFRICAN AMERICAN: 58 mL/min — AB (ref >=60)
Glucose, Bld: 108 mg/dL — ABNORMAL HIGH (ref 65–99)
Potassium: 4.2 mmol/L (ref 3.5–5.3)
Sodium: 140 mmol/L (ref 135–146)
TOTAL PROTEIN: 6.8 g/dL (ref 6.1–8.1)

## 2016-12-28 LAB — LIPID PANEL
Cholesterol: 158 mg/dL (ref <200)
HDL: 35 mg/dL — ABNORMAL LOW (ref >50)
LDL CALC: 73 mg/dL (ref <100)
TRIGLYCERIDES: 250 mg/dL — AB (ref <150)
Total CHOL/HDL Ratio: 4.5 Ratio (ref <5.0)
VLDL: 50 mg/dL — ABNORMAL HIGH (ref <30)

## 2017-03-30 ENCOUNTER — Ambulatory Visit: Payer: Medicare Other | Admitting: Family Medicine

## 2017-04-05 DIAGNOSIS — K219 Gastro-esophageal reflux disease without esophagitis: Secondary | ICD-10-CM | POA: Diagnosis not present

## 2017-04-05 DIAGNOSIS — R05 Cough: Secondary | ICD-10-CM | POA: Diagnosis not present

## 2017-04-05 DIAGNOSIS — J309 Allergic rhinitis, unspecified: Secondary | ICD-10-CM | POA: Diagnosis not present

## 2017-04-11 ENCOUNTER — Ambulatory Visit (INDEPENDENT_AMBULATORY_CARE_PROVIDER_SITE_OTHER): Payer: Medicare Other | Admitting: Family Medicine

## 2017-04-11 ENCOUNTER — Encounter: Payer: Self-pay | Admitting: Family Medicine

## 2017-04-11 VITALS — BP 122/78 | HR 70 | Temp 97.6°F | Resp 16 | Ht 61.0 in | Wt 222.6 lb

## 2017-04-11 DIAGNOSIS — K219 Gastro-esophageal reflux disease without esophagitis: Secondary | ICD-10-CM

## 2017-04-11 DIAGNOSIS — E119 Type 2 diabetes mellitus without complications: Secondary | ICD-10-CM | POA: Diagnosis not present

## 2017-04-11 DIAGNOSIS — F419 Anxiety disorder, unspecified: Secondary | ICD-10-CM | POA: Diagnosis not present

## 2017-04-11 DIAGNOSIS — I10 Essential (primary) hypertension: Secondary | ICD-10-CM

## 2017-04-11 DIAGNOSIS — E785 Hyperlipidemia, unspecified: Secondary | ICD-10-CM

## 2017-04-11 LAB — LIPID PANEL
Cholesterol: 182 mg/dL (ref <200)
HDL: 35 mg/dL — ABNORMAL LOW (ref >50)
LDL CALC: 108 mg/dL — AB (ref <100)
TRIGLYCERIDES: 197 mg/dL — AB (ref <150)
Total CHOL/HDL Ratio: 5.2 Ratio — ABNORMAL HIGH (ref <5.0)
VLDL: 39 mg/dL — ABNORMAL HIGH (ref <30)

## 2017-04-11 LAB — COMPLETE METABOLIC PANEL WITH GFR
ALT: 14 U/L (ref 6–29)
AST: 17 U/L (ref 10–35)
Albumin: 3.9 g/dL (ref 3.6–5.1)
Alkaline Phosphatase: 144 U/L — ABNORMAL HIGH (ref 33–130)
BILIRUBIN TOTAL: 0.7 mg/dL (ref 0.2–1.2)
BUN: 18 mg/dL (ref 7–25)
CALCIUM: 9 mg/dL (ref 8.6–10.4)
CO2: 25 mmol/L (ref 20–31)
CREATININE: 0.88 mg/dL (ref 0.50–0.99)
Chloride: 103 mmol/L (ref 98–110)
GFR, EST AFRICAN AMERICAN: 78 mL/min (ref >=60)
GFR, EST NON AFRICAN AMERICAN: 68 mL/min (ref >=60)
Glucose, Bld: 96 mg/dL (ref 65–99)
Potassium: 4.3 mmol/L (ref 3.5–5.3)
Sodium: 140 mmol/L (ref 135–146)
TOTAL PROTEIN: 7.1 g/dL (ref 6.1–8.1)

## 2017-04-11 LAB — POCT GLYCOSYLATED HEMOGLOBIN (HGB A1C): HEMOGLOBIN A1C: 6

## 2017-04-11 LAB — GLUCOSE, POCT (MANUAL RESULT ENTRY): POC Glucose: 107 mg/dl — AB (ref 70–99)

## 2017-04-11 MED ORDER — ALPRAZOLAM 0.25 MG PO TABS
0.2500 mg | ORAL_TABLET | Freq: Two times a day (BID) | ORAL | 2 refills | Status: DC | PRN
Start: 2017-04-11 — End: 2017-07-13

## 2017-04-11 MED ORDER — OLMESARTAN MEDOXOMIL-HCTZ 20-12.5 MG PO TABS: 1.0000 | ORAL_TABLET | Freq: Every day | ORAL | 0 refills | Status: DC

## 2017-04-11 MED ORDER — METOPROLOL TARTRATE 100 MG PO TABS: 100.0000 mg | ORAL_TABLET | Freq: Two times a day (BID) | ORAL | 0 refills | Status: DC

## 2017-04-11 MED ORDER — ATORVASTATIN CALCIUM 40 MG PO TABS: 40.0000 mg | ORAL_TABLET | Freq: Every day | ORAL | 0 refills | Status: DC

## 2017-04-11 MED ORDER — OMEPRAZOLE 20 MG PO CPDR: 20.0000 mg | DELAYED_RELEASE_CAPSULE | Freq: Every day | ORAL | 0 refills | Status: DC

## 2017-04-11 NOTE — Progress Notes (Signed)
Name: Hayley Simmons   MRN: [id number]    DOB: 10-Apr-1946   Date:04/11/2017       Progress Note  Subjective  Chief Complaint  Chief Complaint  Patient presents with  . Follow-up    3 mo  . Medication Refill    Hypertension  This is a chronic problem. The problem is unchanged. The problem is controlled. Associated symptoms include anxiety. Pertinent negatives include no blurred vision, chest pain, headaches, palpitations or shortness of breath. Past treatments include angiotensin blockers, diuretics and beta blockers. Hypertensive end-organ damage includes kidney disease. There is no history of CAD/MI or CVA.  Hyperlipidemia  This is a chronic problem. The problem is uncontrolled. Recent lipid tests were reviewed and are high. Exacerbating diseases include obesity. Pertinent negatives include no chest pain, leg pain or shortness of breath. Current antihyperlipidemic treatment includes statins. Risk factors for coronary artery disease include dyslipidemia.  Anxiety  Presents for follow-up visit. Patient reports no chest pain, excessive worry, insomnia, irritability, nervous/anxious behavior, palpitations, panic or shortness of breath. Symptoms occur most days. The severity of symptoms is moderate.    Gastroesophageal Reflux  She reports no belching, no chest pain, no dysphagia, no early satiety or no heartburn. This is a chronic problem. The problem has been unchanged. Pertinent negatives include no fatigue. Risk factors include obesity. She has tried a PPI for the symptoms. Past procedures do not include an EGD.  Diabetes  She presents for her follow-up diabetic visit. She has type 2 diabetes mellitus. Her disease course has been stable. Pertinent negatives for hypoglycemia include no headaches or nervousness/anxiousness. Pertinent negatives for diabetes include no blurred vision, no chest pain, no fatigue, no foot paresthesias, no polydipsia and no polyuria. Pertinent negatives for diabetic  complications include no CVA. Current diabetic treatment includes diet. She is following a generally healthy (eats less than she used to since she has been taking Belviq. ) diet. She rarely (Arthritis in her limits how much she can exercise. ) participates in exercise. Frequency home blood tests: does not check her blood glucose. An ACE inhibitor/angiotensin II receptor blocker is being taken.     Past Medical History:  Diagnosis Date  . Allergy   . Anxiety   . GERD (gastroesophageal reflux disease)   . Hyperlipidemia   . Hypertension     Past Surgical History:  Procedure Laterality Date  . ABDOMINAL HYSTERECTOMY  1994  . CARPAL TUNNEL RELEASE Right 07/29/2008    Family History  Problem Relation Age of Onset  . Gout Mother   . Dementia Mother   . Kidney disease Mother   . Cancer Mother        kidney  . Heart attack Father     Social History   Social History  . Marital status: Married    Spouse name: N/A  . Number of children: N/A  . Years of education: N/A   Occupational History  . Not on file.   Social History Main Topics  . Smoking status: Former Smoker    Types: Cigarettes  . Smokeless tobacco: Never Used     Comment: quit in 11/1997  . Alcohol use No  . Drug use: No  . Sexual activity: No   Other Topics Concern  . Not on file   Social History Narrative  . No narrative on file     Current Outpatient Prescriptions:  .  ALPRAZolam (XANAX) 0.25 MG tablet, Take 1 tablet (0.25 mg total) by mouth 2 (two) times  daily as needed for anxiety., Disp: 60 tablet, Rfl: 2 .  aspirin 81 MG tablet, Take by mouth., Disp: , Rfl:  .  atorvastatin (LIPITOR) 40 MG tablet, Take 1 tablet (40 mg total) by mouth at bedtime., Disp: 90 tablet, Rfl: 0 .  BELVIQ 10 MG TABS, Take 1 tablet by mouth 2 (two) times daily. (Patient taking differently: Take 1 tablet by mouth daily. ), Disp: 60 tablet, Rfl: 2 .  CALCIUM CARBONATE-VIT D-MIN PO, Take by mouth., Disp: , Rfl:  .  metoprolol  (LOPRESSOR) 100 MG tablet, Take 1 tablet (100 mg total) by mouth 2 (two) times daily., Disp: 180 tablet, Rfl: 0 .  omeprazole (PRILOSEC) 20 MG capsule, Take 1 capsule (20 mg total) by mouth daily., Disp: 90 capsule, Rfl: 0 .  valsartan-hydrochlorothiazide (DIOVAN-HCT) 320-25 MG tablet, Take 1 tablet by mouth daily., Disp: 90 tablet, Rfl: 0  Allergies  Allergen Reactions  . Caduet  [Amlodipine-Atorvastatin] Other (See Comments)    Makes Pt feel bad makes pt feel bad  . Cyclosporine Other (See Comments), Nausea And Vomiting and Nausea Only  . Escitalopram Other (See Comments)     Review of Systems  Constitutional: Negative for fatigue and irritability.  Eyes: Negative for blurred vision.  Respiratory: Negative for shortness of breath.   Cardiovascular: Negative for chest pain and palpitations.  Gastrointestinal: Negative for dysphagia and heartburn.  Neurological: Negative for headaches.  Endo/Heme/Allergies: Negative for polydipsia.  Psychiatric/Behavioral: The patient is not nervous/anxious and does not have insomnia.      Objective  Vitals:   04/11/17 0946  BP: 122/78  Pulse: 70  Resp: 16  Temp: 97.6 F (36.4 C)  TempSrc: Oral  SpO2: 94%  Weight: 222 lb 9.6 oz (101 kg)  Height: 5\' 1"  (1.549 m)    Physical Exam  Constitutional: She is oriented to person, place, and time and well-developed, well-nourished, and in no distress.  HENT:  Head: Normocephalic and atraumatic.  Cardiovascular: Normal rate, regular rhythm, S1 normal, S2 normal and normal heart sounds.   No murmur heard. Pulmonary/Chest: Effort normal and breath sounds normal. She has no wheezes.  Abdominal: Soft. Bowel sounds are normal. There is no tenderness.  Musculoskeletal: She exhibits no edema.  Neurological: She is alert and oriented to person, place, and time.  Psychiatric: Mood, memory, affect and judgment normal.  Nursing note and vitals reviewed.    Recent Results (from the past 2160 hour(s))   POCT Glucose (CBG)     Status: Abnormal   Collection Time: 04/11/17  9:51 AM  Result Value Ref Range   POC Glucose 107 (A) 70 - 99 mg/dl  POCT HgB A1C     Status: Abnormal   Collection Time: 04/11/17 10:01 AM  Result Value Ref Range   Hemoglobin A1C 6.0      Assessment & Plan  1. Controlled type 2 diabetes mellitus without complication, without long-term current use of insulin (HCC)   A1c 6.0%, diet controlled diabetes - POCT HgB A1C - POCT Glucose (CBG) - Urine Microalbumin w/creat. ratio  2. Anxiety Stable and responsive to alprazolam taken twice daily as needed, refills provided - ALPRAZolam (XANAX) 0.25 MG tablet; Take 1 tablet (0.25 mg total) by mouth 2 (two) times daily as needed for anxiety.  Dispense: 60 tablet; Refill: 2  3. Essential hypertension BP stable on present antihypertensive treatment, changed  valsartan to olmesartan, new prescription for Benicar-HCTZ 20-12.5 mg provided - olmesartan-hydrochlorothiazide (BENICAR HCT) 20-12.5 MG tablet; Take 1 tablet by mouth  daily.  Dispense: 90 tablet; Refill: 0 - metoprolol tartrate (LOPRESSOR) 100 MG tablet; Take 1 tablet (100 mg total) by mouth 2 (two) times daily.  Dispense: 180 tablet; Refill: 0  4. Gastroesophageal reflux disease, esophagitis presence not specified  - omeprazole (PRILOSEC) 20 MG capsule; Take 1 capsule (20 mg total) by mouth daily.  Dispense: 90 capsule; Refill: 0  5. Hyperlipidemia, unspecified hyperlipidemia type  - atorvastatin (LIPITOR) 40 MG tablet; Take 1 tablet (40 mg total) by mouth at bedtime.  Dispense: 90 tablet; Refill: 0 - Lipid panel - COMPLETE METABOLIC PANEL WITH GFR  Dorann Davidson [facility] 04/11/2017 10:05 AM

## 2017-04-12 LAB — MICROALBUMIN / CREATININE URINE RATIO
CREATININE, URINE: 186 mg/dL (ref 20–320)
MICROALB UR: 1.8 mg/dL
MICROALB/CREAT RATIO: 10 ug/mg{creat} (ref <30)

## 2017-05-17 ENCOUNTER — Encounter: Payer: Medicare Other | Admitting: Family Medicine

## 2017-05-30 DIAGNOSIS — E119 Type 2 diabetes mellitus without complications: Secondary | ICD-10-CM | POA: Diagnosis not present

## 2017-05-30 DIAGNOSIS — Z6841 Body Mass Index (BMI) 40.0 and over, adult: Secondary | ICD-10-CM | POA: Diagnosis not present

## 2017-05-30 DIAGNOSIS — M1712 Unilateral primary osteoarthritis, left knee: Secondary | ICD-10-CM | POA: Diagnosis not present

## 2017-06-28 DIAGNOSIS — Z1231 Encounter for screening mammogram for malignant neoplasm of breast: Secondary | ICD-10-CM | POA: Diagnosis not present

## 2017-07-03 ENCOUNTER — Encounter: Payer: Self-pay | Admitting: Family Medicine

## 2017-07-13 ENCOUNTER — Encounter: Payer: Self-pay | Admitting: Family Medicine

## 2017-07-13 ENCOUNTER — Other Ambulatory Visit: Payer: Self-pay | Admitting: Family Medicine

## 2017-07-13 ENCOUNTER — Ambulatory Visit (INDEPENDENT_AMBULATORY_CARE_PROVIDER_SITE_OTHER): Payer: Medicare Other | Admitting: Family Medicine

## 2017-07-13 VITALS — BP 148/92 | HR 60 | Temp 97.8°F | Resp 16 | Ht 61.0 in | Wt 235.0 lb

## 2017-07-13 DIAGNOSIS — M436 Torticollis: Secondary | ICD-10-CM

## 2017-07-13 DIAGNOSIS — I10 Essential (primary) hypertension: Secondary | ICD-10-CM | POA: Diagnosis not present

## 2017-07-13 DIAGNOSIS — E785 Hyperlipidemia, unspecified: Secondary | ICD-10-CM | POA: Diagnosis not present

## 2017-07-13 DIAGNOSIS — E119 Type 2 diabetes mellitus without complications: Secondary | ICD-10-CM | POA: Diagnosis not present

## 2017-07-13 DIAGNOSIS — K219 Gastro-esophageal reflux disease without esophagitis: Secondary | ICD-10-CM

## 2017-07-13 DIAGNOSIS — M542 Cervicalgia: Secondary | ICD-10-CM

## 2017-07-13 DIAGNOSIS — Z23 Encounter for immunization: Secondary | ICD-10-CM | POA: Diagnosis not present

## 2017-07-13 DIAGNOSIS — F419 Anxiety disorder, unspecified: Secondary | ICD-10-CM

## 2017-07-13 LAB — POCT GLYCOSYLATED HEMOGLOBIN (HGB A1C): Hemoglobin A1C: 5.9

## 2017-07-13 LAB — POCT CBG (FASTING - GLUCOSE)-MANUAL ENTRY: GLUCOSE FASTING, POC: 105 mg/dL — AB (ref 70–99)

## 2017-07-13 MED ORDER — OLMESARTAN MEDOXOMIL-HCTZ 20-12.5 MG PO TABS: 1.0000 | ORAL_TABLET | Freq: Every day | ORAL | 0 refills | Status: DC

## 2017-07-13 MED ORDER — OMEPRAZOLE 20 MG PO CPDR: 20.0000 mg | DELAYED_RELEASE_CAPSULE | Freq: Every day | ORAL | 0 refills | Status: DC

## 2017-07-13 MED ORDER — ALPRAZOLAM 0.25 MG PO TABS: 0.2500 mg | ORAL_TABLET | Freq: Two times a day (BID) | ORAL | 2 refills | Status: DC | PRN

## 2017-07-13 MED ORDER — TIZANIDINE HCL 2 MG PO TABS: 2.0000 mg | ORAL_TABLET | Freq: Every day | ORAL | 0 refills | Status: DC

## 2017-07-13 MED ORDER — METOPROLOL TARTRATE 100 MG PO TABS: 100.0000 mg | ORAL_TABLET | Freq: Two times a day (BID) | ORAL | 0 refills | Status: DC

## 2017-07-13 MED ORDER — ATORVASTATIN CALCIUM 40 MG PO TABS: 40.0000 mg | ORAL_TABLET | Freq: Every day | ORAL | 0 refills | Status: DC

## 2017-07-13 NOTE — Progress Notes (Signed)
Name: Hayley Simmons   MRN: [id number]    DOB: 03/27/1946   Date:07/13/2017       Progress Note  Subjective  Chief Complaint  Chief Complaint  Patient presents with  . Medication Refill    All meds  . Anxiety    3 mnth f/u  . Torticollis    Pt states she had this for a week and its worst when she trys to go to bed.     Anxiety  Presents for follow-up visit. Symptoms include excessive worry, insomnia (recently trouble falling asleep due to neck pain) and nervous/anxious behavior. Patient reports no chest pain, nausea, obsessions, palpitations or shortness of breath. The severity of symptoms is moderate and causing significant distress.    Hypertension  This is a chronic problem. The problem is unchanged. The problem is uncontrolled. Associated symptoms include anxiety and neck pain. Pertinent negatives include no blurred vision, chest pain, headaches, palpitations or shortness of breath. Past treatments include angiotensin blockers, diuretics and beta blockers. There is no history of kidney disease, CAD/MI or CVA.  Hyperlipidemia  This is a chronic problem. The problem is controlled. Recent lipid tests were reviewed and are high. Pertinent negatives include no chest pain, leg pain, myalgias or shortness of breath. Current antihyperlipidemic treatment includes statins.  Gastroesophageal Reflux  She reports no belching, no chest pain, no coughing, no heartburn or no nausea. This is a recurrent problem. The problem has been unchanged. She has tried a PPI for the symptoms. Past procedures do not include an EGD.  Neck Pain   This is a new problem. The current episode started in the past 7 days. The pain is present in the right side. Quality: feels like a 'crick' in my neck. The pain is moderate. The symptoms are aggravated by position (worse with turning her [place] the opposite direction). Stiffness is present at night. Pertinent negatives include no chest pain, headaches or leg pain. She has  tried acetaminophen for the symptoms. The treatment provided no relief.     Past Medical History:  Diagnosis Date  . Allergy   . Anxiety   . GERD (gastroesophageal reflux disease)   . Hyperlipidemia   . Hypertension     Past Surgical History:  Procedure Laterality Date  . ABDOMINAL HYSTERECTOMY  1994  . CARPAL TUNNEL RELEASE Right 07/29/2008    Family History  Problem Relation Age of Onset  . Gout Mother   . Dementia Mother   . Kidney disease Mother   . Cancer Mother        kidney  . Heart attack Father     Social History   Social History  . Marital status: Married    Spouse name: N/A  . Number of children: N/A  . Years of education: N/A   Occupational History  . Not on file.   Social History Main Topics  . Smoking status: Former Smoker    Types: Cigarettes  . Smokeless tobacco: Never Used     Comment: quit in 11/1997  . Alcohol use No  . Drug use: No  . Sexual activity: No   Other Topics Concern  . Not on file   Social History Narrative  . No narrative on file     Current Outpatient Prescriptions:  .  albuterol (PROVENTIL HFA) 108 (90 Base) MCG/ACT inhaler, Inhale 2 puffs into the lungs every 6 (six) hours as needed., Disp: , Rfl:  .  ALPRAZolam (XANAX) 0.25 MG tablet, Take 1 tablet (0.25  mg total) by mouth 2 (two) times daily as needed for anxiety., Disp: 60 tablet, Rfl: 2 .  aspirin 81 MG tablet, Take by mouth., Disp: , Rfl:  .  atorvastatin (LIPITOR) 40 MG tablet, Take 1 tablet (40 mg total) by mouth at bedtime., Disp: 90 tablet, Rfl: 0 .  CALCIUM CARBONATE-VIT D-MIN PO, Take 600 mg by mouth 2 (two) times daily. , Disp: , Rfl:  .  metoprolol tartrate (LOPRESSOR) 100 MG tablet, Take 1 tablet (100 mg total) by mouth 2 (two) times daily., Disp: 180 tablet, Rfl: 0 .  olmesartan-hydrochlorothiazide (BENICAR HCT) 20-12.5 MG tablet, Take 1 tablet by mouth daily., Disp: 90 tablet, Rfl: 0 .  omeprazole (PRILOSEC) 20 MG capsule, Take 1 capsule (20 mg total)  by mouth daily., Disp: 90 capsule, Rfl: 0  Allergies  Allergen Reactions  . Caduet  [Amlodipine-Atorvastatin] Other (See Comments)    Makes Pt feel bad makes pt feel bad  . Cyclosporine Other (See Comments), Nausea And Vomiting and Nausea Only  . Escitalopram Other (See Comments)     Review of Systems  Eyes: Negative for blurred vision.  Respiratory: Negative for cough and shortness of breath.   Cardiovascular: Negative for chest pain and palpitations.  Gastrointestinal: Negative for heartburn and nausea.  Musculoskeletal: Positive for neck pain. Negative for myalgias.  Neurological: Negative for headaches.  Psychiatric/Behavioral: The patient is nervous/anxious and has insomnia (recently trouble falling asleep due to neck pain).      Objective  Vitals:   07/13/17 0830  BP: (!) 148/92  Pulse: 60  Resp: 16  Temp: 97.8 F (36.6 C)  TempSrc: Oral  SpO2: 96%  Weight: 235 lb (106.6 kg)  Height: 5\' 1"  (1.549 m)    Physical Exam  Constitutional: She is oriented to person, place, and time and well-developed, well-nourished, and in no distress.  HENT:  Head: Normocephalic and atraumatic.  Cardiovascular: Normal rate, regular rhythm, S1 normal, S2 normal and normal heart sounds.   No murmur heard. Pulmonary/Chest: Effort normal and breath sounds normal. She has no wheezes.  Abdominal: Soft. Bowel sounds are normal. There is no tenderness.  Musculoskeletal: She exhibits no edema.       Cervical back: She exhibits tenderness. She exhibits no swelling and no pain.  Tenderness to palpation over the right neck muscles  Neurological: She is alert and oriented to person, place, and time.  Psychiatric: Mood, memory, affect and judgment normal.  Nursing note and vitals reviewed.    Assessment & Plan  1. Needs flu shot  - Flu vaccine HIGH DOSE PF (Fluzone High dose)  2. Essential hypertension Blood pressure is elevated, likely a combination of anxiety versus acute neck pain,  continue on present therapy and recheck in 3 months - olmesartan-hydrochlorothiazide (BENICAR HCT) 20-12.5 MG tablet; Take 1 tablet by mouth daily.  Dispense: 90 tablet; Refill: 0 - metoprolol tartrate (LOPRESSOR) 100 MG tablet; Take 1 tablet (100 mg total) by mouth 2 (two) times daily.  Dispense: 180 tablet; Refill: 0  3. Gastroesophageal reflux disease, esophagitis presence not specified In terms of reflux are stable on PPI - omeprazole (PRILOSEC) 20 MG capsule; Take 1 capsule (20 mg total) by mouth daily.  Dispense: 90 capsule; Refill: 0  4. Hyperlipidemia, unspecified hyperlipidemia type LDL elevated in July 2018, continue on statin - atorvastatin (LIPITOR) 40 MG tablet; Take 1 tablet (40 mg total) by mouth at bedtime.  Dispense: 90 tablet; Refill: 0  5. Anxiety Symptoms of anxiety are stable on alprazolam  taken up to twice daily as needed - ALPRAZolam (XANAX) 0.25 MG tablet; Take 1 tablet (0.25 mg total) by mouth 2 (two) times daily as needed for anxiety.  Dispense: 60 tablet; Refill: 2  6. Controlled type 2 diabetes mellitus without complication, without long-term current use of insulin (HCC)   A1c is 5.9%, well-controlled diabetes - POCT CBG (Fasting - Glucose) - POCT glycosylated hemoglobin (Hb A1C)  7. Acute muscle stiffness of neck Suspect acute muscle spasm, started tizanidine at bedtime, use heating pad - tiZANidine (ZANAFLEX) 2 MG tablet; Take 1 tablet (2 mg total) by mouth at bedtime.  Dispense: 7 tablet; Refill: 0   Marquee Fuchs [facility] 07/13/2017 8:36 AM

## 2017-07-13 NOTE — Progress Notes (Signed)
The following letter was provided to Hayley Simmons during their visit today: ---------------------------------------  Dear valued [facility] Patient,  I am writing to share that as of November 03, 2017, I will no longer be seeing patients at [facility]. While it has been my privilege to care for you as a physician, I have decided to move outside of [place] to pursue other opportunities.  The staff at [facility] has been supportive of my decision and are supportive of any patients who have been under my care.  They will be happy to provide care to you and your family.  The office staff will do everything they can to ensure a seamless transition of care at [facility].  However if you are on any controlled substance medications (i.e. Pain medication or benzodiazepines), they will no longer be able to refill those medications, but we will be more than happy to refer you to a specialist.  [facility] will also assist you with the transfer of medical records should you wish to seek care elsewhere.  If you have any questions about your future care, you may call the office at [phone].  I have enjoyed getting to know my patients here and I wish you the very best.  Sincerely,  Rochel Brome, MD  ---------------------------------------  A written copy of this letter was given to the patient.  The patient verbalizes understanding of the letter, and does request referral to specialist or to new primary care provider.  This decision has been conveyed to Dr. Manuella Ghazi, who will place any appropriate referrals during today's visit.

## 2017-07-20 ENCOUNTER — Other Ambulatory Visit: Payer: Self-pay | Admitting: Family Medicine

## 2017-07-20 DIAGNOSIS — M436 Torticollis: Secondary | ICD-10-CM

## 2017-07-20 DIAGNOSIS — M542 Cervicalgia: Secondary | ICD-10-CM

## 2017-07-20 MED ORDER — TIZANIDINE HCL 2 MG PO TABS: ORAL_TABLET | ORAL | 0 refills | Status: DC

## 2017-07-20 NOTE — Telephone Encounter (Signed)
Patient was given a mustle relaxer last week and she has run out of it.  She wants to know if she can have a refill on the medication because her problem has not completely gone.

## 2017-07-20 NOTE — Telephone Encounter (Signed)
Pt is requesting a medication refill on a muscle relaxer.

## 2017-07-21 ENCOUNTER — Ambulatory Visit
Admission: RE | Admit: 2017-07-21 | Discharge: 2017-07-21 | Disposition: A | Discharge status: Home / Self Care | Payer: Medicare Other | Source: Ambulatory Visit | Attending: Family Medicine | Admitting: Family Medicine

## 2017-07-21 ENCOUNTER — Ambulatory Visit (INDEPENDENT_AMBULATORY_CARE_PROVIDER_SITE_OTHER): Payer: Medicare Other | Admitting: Family Medicine

## 2017-07-21 ENCOUNTER — Encounter: Payer: Self-pay | Admitting: Family Medicine

## 2017-07-21 VITALS — BP 130/72 | HR 94 | Temp 98.6°F | Resp 16 | Wt 233.5 lb

## 2017-07-21 DIAGNOSIS — M79671 Pain in right foot: Secondary | ICD-10-CM | POA: Diagnosis not present

## 2017-07-21 DIAGNOSIS — M7989 Other specified soft tissue disorders: Secondary | ICD-10-CM

## 2017-07-21 DIAGNOSIS — M25571 Pain in right ankle and joints of right foot: Secondary | ICD-10-CM | POA: Insufficient documentation

## 2017-07-21 DIAGNOSIS — M25471 Effusion, right ankle: Secondary | ICD-10-CM | POA: Diagnosis not present

## 2017-07-21 LAB — CBC WITH DIFFERENTIAL/PLATELET
BASOS ABS: 43 {cells}/uL (ref 0–200)
Basophils Relative: 0.5 %
Eosinophils Absolute: 111 cells/uL (ref 15–500)
Eosinophils Relative: 1.3 %
HCT: 38.5 % (ref 35.0–45.0)
Hemoglobin: 13.2 g/dL (ref 11.7–15.5)
Lymphs Abs: 1675 cells/uL (ref 850–3900)
MCH: 29.9 pg (ref 27.0–33.0)
MCHC: 34.3 g/dL (ref 32.0–36.0)
MCV: 87.3 fL (ref 80.0–100.0)
MONOS PCT: 6.7 %
MPV: 9.3 fL (ref 7.5–12.5)
NEUTROS ABS: 6103 {cells}/uL (ref 1500–7800)
NEUTROS PCT: 71.8 %
Platelets: 256 10*3/uL (ref 140–400)
RBC: 4.41 10*6/uL (ref 3.80–5.10)
RDW: 13.2 % (ref 11.0–15.0)
Total Lymphocyte: 19.7 %
WBC mixed population: 570 cells/uL (ref 200–950)
WBC: 8.5 10*3/uL (ref 3.8–10.8)

## 2017-07-21 LAB — URIC ACID: URIC ACID, SERUM: 7.1 mg/dL — AB (ref 2.5–7.0)

## 2017-07-21 NOTE — Progress Notes (Signed)
Name: Hayley Simmons   MRN: [id number]    DOB: 1946-08-16   Date:07/21/2017       Progress Note  Subjective  Chief Complaint  Chief Complaint  Patient presents with  . Gout    right foot for 1 week, swollen, painful    HPI  Right foot swelling: Pt. 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 No redness. She is concerned about gout.    Past Medical History:  Diagnosis Date  . Allergy   . Anxiety   . GERD (gastroesophageal reflux disease)   . Hyperlipidemia   . Hypertension     Past Surgical History:  Procedure Laterality Date  . ABDOMINAL HYSTERECTOMY  1994  . CARPAL TUNNEL RELEASE Right 07/29/2008    Family History  Problem Relation Age of Onset  . Gout Mother   . Dementia Mother   . Kidney disease Mother   . Cancer Mother        kidney  . Heart attack Father     Social History   Social History  . Marital status: Married    Spouse name: N/A  . Number of children: N/A  . Years of education: N/A   Occupational History  . Not on file.   Social History Main Topics  . Smoking status: Former Smoker    Types: Cigarettes  . Smokeless tobacco: Never Used     Comment: quit in 11/1997  . Alcohol use No  . Drug use: No  . Sexual activity: No   Other Topics Concern  . Not on file   Social History Narrative  . No narrative on file     Current Outpatient Prescriptions:  .  albuterol (PROVENTIL HFA) 108 (90 Base) MCG/ACT inhaler, Inhale 2 puffs into the lungs every 6 (six) hours as needed., Disp: , Rfl:  .  ALPRAZolam (XANAX) 0.25 MG tablet, Take 1 tablet (0.25 mg total) by mouth 2 (two) times daily as needed for anxiety., Disp: 60 tablet, Rfl: 2 .  aspirin 81 MG tablet, Take by mouth., Disp: , Rfl:  .  atorvastatin (LIPITOR) 40 MG tablet, Take 1 tablet  (40 mg total) by mouth at bedtime., Disp: 90 tablet, Rfl: 0 .  CALCIUM CARBONATE-VIT D-MIN PO, Take 600 mg by mouth 2 (two) times daily. , Disp: , Rfl:  .  metoprolol tartrate (LOPRESSOR) 100 MG tablet, Take 1 tablet (100 mg total) by mouth 2 (two) times daily., Disp: 180 tablet, Rfl: 0 .  olmesartan-hydrochlorothiazide (BENICAR HCT) 20-12.5 MG tablet, Take 1 tablet by mouth daily., Disp: 90 tablet, Rfl: 0 .  omeprazole (PRILOSEC) 20 MG capsule, Take 1 capsule (20 mg total) by mouth daily., Disp: 90 capsule, Rfl: 0 .  tiZANidine (ZANAFLEX) 2 MG tablet, TAKE 1 TABLET(2 MG) BY MOUTH AT BEDTIME, Disp: 14 tablet, Rfl: 0  Allergies  Allergen Reactions  . Caduet  [Amlodipine-Atorvastatin] Other (See Comments)    Makes Pt feel bad makes pt feel bad  . Cyclosporine Other (See Comments), Nausea And Vomiting and Nausea Only  . Escitalopram Other (See Comments)     ROS  Please see HPI for complete discussion of ROS   Objective  Vitals:   07/21/17 0820  BP: 130/72  Pulse: 94  Resp: 16  Temp: 98.6 F (37 C)  TempSrc: Oral  SpO2: 94%  Weight: 233 lb 8 oz (105.9 kg)    Physical Exam  Constitutional: She is oriented to person, place, and time and well-developed, well-nourished, and in no distress.  Musculoskeletal:       Right ankle: She exhibits no swelling. Tenderness.       Right foot: Normal. There is normal range of motion, no tenderness, no swelling and normal capillary refill.  Neurological: She is alert and oriented to person, place, and time.  Psychiatric: Mood, memory, affect and judgment normal.  Nursing note and vitals reviewed.     Recent Results (from the past 2160 hour(s))  POCT CBG (Fasting - Glucose)     Status: Abnormal   Collection Time: 07/13/17  9:03 AM  Result Value Ref Range   Glucose Fasting, POC 105 (A) 70 - 99 mg/dL  POCT glycosylated hemoglobin (Hb A1C)     Status: Abnormal   Collection Time: 07/13/17  9:34 AM  Result Value Ref Range   Hemoglobin A1C  5.9      Assessment & Plan  1. Swelling of right foot Suspect venous insufficiency (dependent edema), however, will rule out infection vs gout vs other musculoskeletal abnormality.  - CBC with Differential/Platelet - Uric acid - DG Foot Complete Right; Future  2. Pain and swelling of right ankle As above, likely dependent edema, obtain pertinent lab work and X rays. Advised to elevate foot and ankle for relief.  - CBC with Differential/Platelet - Uric acid - DG Ankle Complete Right; Future   Keneisha Heckart Asad A. [facility] 07/21/2017 8:36 AM

## 2017-07-31 ENCOUNTER — Telehealth: Payer: Self-pay | Admitting: Family Medicine

## 2017-07-31 ENCOUNTER — Telehealth: Payer: Self-pay

## 2017-07-31 NOTE — Telephone Encounter (Unsigned)
Copied from [place]. Topic: Quick Communication - See Telephone Encounter >> Jul 31, 2017 11:00 AM Hewitt Shorts wrote: CRM for notification. See Telephone encounter for: pt states that she and amber have been working on a refill request for her arthritis medication she did not know that name she states it starts with an M she states that Amber was going to call in the refill but no dosage was on the medcation and she was just checking with amber [phone]  07/31/17.

## 2017-07-31 NOTE — Telephone Encounter (Signed)
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 She is now having to walk with a cane.

## 2017-07-31 NOTE — Telephone Encounter (Signed)
I spoke to the pt at her last visit regarding a medication that helps with arthritis. The pt states it started with the letter "M". She is asking for Rx for mobic. She asked to speak to you about it but I became distracted. Pt called this morning to get an update on getting the refill. Please advise.

## 2017-08-02 ENCOUNTER — Other Ambulatory Visit: Payer: Self-pay | Admitting: Family Medicine

## 2017-08-02 DIAGNOSIS — M19071 Primary osteoarthritis, right ankle and foot: Secondary | ICD-10-CM

## 2017-08-02 DIAGNOSIS — M1711 Unilateral primary osteoarthritis, right knee: Secondary | ICD-10-CM

## 2017-08-02 MED ORDER — MELOXICAM 7.5 MG PO TABS: 7.5000 mg | ORAL_TABLET | Freq: Every day | ORAL | 0 refills | Status: DC

## 2017-08-02 NOTE — Telephone Encounter (Signed)
Reviewed knee x-rays from 2016, she would benefit from meloxicam which is an NSAID that helps relieve pain and inflammation associated with arthritis. However long-term use of meloxicam is not appropriate for her as she has history of chronic kidney disease. Alternatively, she may be referred to orthopedics for further evaluation and management. Please confirm with patient and advise.

## 2017-08-31 DIAGNOSIS — M170 Bilateral primary osteoarthritis of knee: Secondary | ICD-10-CM | POA: Diagnosis not present

## 2017-10-13 ENCOUNTER — Ambulatory Visit (INDEPENDENT_AMBULATORY_CARE_PROVIDER_SITE_OTHER): Payer: Medicare Other | Admitting: Family Medicine

## 2017-10-13 ENCOUNTER — Encounter: Payer: Self-pay | Admitting: Family Medicine

## 2017-10-13 VITALS — BP 114/68 | HR 60 | Temp 97.8°F | Resp 14 | Wt 221.8 lb

## 2017-10-13 DIAGNOSIS — K219 Gastro-esophageal reflux disease without esophagitis: Secondary | ICD-10-CM | POA: Diagnosis not present

## 2017-10-13 DIAGNOSIS — M170 Bilateral primary osteoarthritis of knee: Secondary | ICD-10-CM

## 2017-10-13 DIAGNOSIS — E785 Hyperlipidemia, unspecified: Secondary | ICD-10-CM

## 2017-10-13 DIAGNOSIS — I10 Essential (primary) hypertension: Secondary | ICD-10-CM

## 2017-10-13 DIAGNOSIS — F419 Anxiety disorder, unspecified: Secondary | ICD-10-CM

## 2017-10-13 LAB — LIPID PANEL
CHOL/HDL RATIO: 4.7 (calc) (ref <5.0)
CHOLESTEROL: 168 mg/dL (ref <200)
HDL: 36 mg/dL — AB (ref >50)
LDL CHOLESTEROL (CALC): 103 mg/dL — AB
Non-HDL Cholesterol (Calc): 132 mg/dL (calc) — ABNORMAL HIGH (ref <130)
Triglycerides: 171 mg/dL — ABNORMAL HIGH (ref <150)

## 2017-10-13 MED ORDER — ALPRAZOLAM 0.25 MG PO TABS: 0.2500 mg | ORAL_TABLET | Freq: Two times a day (BID) | ORAL | 2 refills | Status: DC | PRN

## 2017-10-13 MED ORDER — ATORVASTATIN CALCIUM 40 MG PO TABS: 40.0000 mg | ORAL_TABLET | Freq: Every day | ORAL | 0 refills | Status: DC

## 2017-10-13 MED ORDER — METOPROLOL TARTRATE 100 MG PO TABS: 100.0000 mg | ORAL_TABLET | Freq: Two times a day (BID) | ORAL | 0 refills | Status: DC

## 2017-10-13 MED ORDER — OLMESARTAN MEDOXOMIL-HCTZ 20-12.5 MG PO TABS: 1.0000 | ORAL_TABLET | Freq: Every day | ORAL | 0 refills | Status: DC

## 2017-10-13 MED ORDER — MELOXICAM 7.5 MG PO TABS: 7.5000 mg | ORAL_TABLET | Freq: Every day | ORAL | 2 refills | Status: DC

## 2017-10-13 MED ORDER — OMEPRAZOLE 20 MG PO CPDR: 20.0000 mg | DELAYED_RELEASE_CAPSULE | Freq: Every day | ORAL | 0 refills | Status: DC

## 2017-10-13 NOTE — Progress Notes (Signed)
Name: Hayley Simmons   MRN: [id number]    DOB: 01/11/1946   Date:10/13/2017       Progress Note  Subjective  Chief Complaint  Chief Complaint  Patient presents with  . Hypertension    Pt denies any issues  . Hyperlipidemia  . Medication Refill    Hypertension  This is a chronic problem. The problem is unchanged. The problem is controlled. Associated symptoms include anxiety. Pertinent negatives include no blurred vision, chest pain, headaches, palpitations or shortness of breath. Past treatments include beta blockers, angiotensin blockers and diuretics. There is no history of kidney disease, CAD/MI or CVA.  Hyperlipidemia  This is a chronic problem. The problem is uncontrolled. Recent lipid tests were reviewed and are high. Exacerbating diseases include obesity. Pertinent negatives include no chest pain, leg pain, myalgias or shortness of breath. Current antihyperlipidemic treatment includes statins. Risk factors for coronary artery disease include diabetes mellitus, dyslipidemia and obesity.  Gastroesophageal Reflux  She reports no abdominal pain, no belching, no chest pain, no choking, no nausea or no sore throat. This is a chronic problem. The problem has been unchanged. She has tried a PPI (does not take Omeprazole every day) for the symptoms. Past procedures do not include an EGD or esophageal manometry.  Anxiety  Presents for follow-up visit. Patient reports no chest pain, depressed mood, excessive worry, insomnia, nausea, nervous/anxious behavior, palpitations, panic or shortness of breath. Symptoms occur most days.    Pt. has arthritis in multiple joints including fingers, knees and neck, she has seen orthopedics who prescribed Meloxicam for relief. She has received steroid shots with partial relief as well, she is frustrated with multiple joints being affected by arthritis.   Past Medical History:  Diagnosis Date  . Allergy   . Anxiety   . GERD (gastroesophageal reflux  disease)   . Hyperlipidemia   . Hypertension     Past Surgical History:  Procedure Laterality Date  . ABDOMINAL HYSTERECTOMY  1994  . CARPAL TUNNEL RELEASE Right 07/29/2008    Family History  Problem Relation Age of Onset  . Gout Mother   . Dementia Mother   . Kidney disease Mother   . Cancer Mother        kidney  . Heart attack Father     Social History   Socioeconomic History  . Marital status: Married    Spouse name: Not on file  . Number of children: Not on file  . Years of education: Not on file  . Highest education level: Not on file  Social Needs  . Financial resource strain: Not on file  . Food insecurity - worry: Not on file  . Food insecurity - inability: Not on file  . Transportation needs - medical: Not on file  . Transportation needs - non-medical: Not on file  Occupational History  . Not on file  Tobacco Use  . Smoking status: Former Smoker    Types: Cigarettes  . Smokeless tobacco: Never Used  . Tobacco comment: quit in 11/1997  Substance and Sexual Activity  . Alcohol use: No    Alcohol/week: 0.0 oz  . Drug use: No  . Sexual activity: No  Other Topics Concern  . Not on file  Social History Narrative  . Not on file     Current Outpatient Medications:  .  albuterol (PROVENTIL HFA) 108 (90 Base) MCG/ACT inhaler, Inhale 2 puffs into the lungs every 6 (six) hours as needed., Disp: , Rfl:  .  ALPRAZolam (  XANAX) 0.25 MG tablet, Take 1 tablet (0.25 mg total) by mouth 2 (two) times daily as needed for anxiety., Disp: 60 tablet, Rfl: 2 .  aspirin 81 MG tablet, Take by mouth., Disp: , Rfl:  .  atorvastatin (LIPITOR) 40 MG tablet, Take 1 tablet (40 mg total) by mouth at bedtime., Disp: 90 tablet, Rfl: 0 .  CALCIUM CARBONATE-VIT D-MIN PO, Take 600 mg by mouth 2 (two) times daily. , Disp: , Rfl:  .  meloxicam (MOBIC) 7.5 MG tablet, Take 1 tablet (7.5 mg total) daily by mouth. (Patient taking differently: Take 15 mg by mouth daily. ), Disp: 30 tablet, Rfl:  0 .  metoprolol tartrate (LOPRESSOR) 100 MG tablet, Take 1 tablet (100 mg total) by mouth 2 (two) times daily., Disp: 180 tablet, Rfl: 0 .  olmesartan-hydrochlorothiazide (BENICAR HCT) 20-12.5 MG tablet, Take 1 tablet by mouth daily., Disp: 90 tablet, Rfl: 0 .  omeprazole (PRILOSEC) 20 MG capsule, Take 1 capsule (20 mg total) by mouth daily., Disp: 90 capsule, Rfl: 0 .  tiZANidine (ZANAFLEX) 2 MG tablet, TAKE 1 TABLET(2 MG) BY MOUTH AT BEDTIME (Patient not taking: Reported on 10/13/2017), Disp: 14 tablet, Rfl: 0  Allergies  Allergen Reactions  . Caduet  [Amlodipine-Atorvastatin] Other (See Comments)    Makes Pt feel bad makes pt feel bad  . Cyclosporine Other (See Comments), Nausea And Vomiting and Nausea Only  . Escitalopram Other (See Comments)     Review of Systems  HENT: Negative for sore throat.   Eyes: Negative for blurred vision.  Respiratory: Negative for choking and shortness of breath.   Cardiovascular: Negative for chest pain and palpitations.  Gastrointestinal: Negative for abdominal pain and nausea.  Musculoskeletal: Negative for myalgias.  Neurological: Negative for headaches.  Psychiatric/Behavioral: The patient is not nervous/anxious and does not have insomnia.     Objective  Vitals:   10/13/17 0827  BP: 114/68  Pulse: 60  Resp: 14  Temp: 97.8 F (36.6 C)  TempSrc: Oral  SpO2: 96%  Weight: 221 lb 12.8 oz (100.6 kg)    Physical Exam  Constitutional: She is oriented to person, place, and time and well-developed, well-nourished, and in no distress.  HENT:  Head: Normocephalic and atraumatic.  Cardiovascular: Normal rate, regular rhythm and normal heart sounds.  No murmur heard. Pulmonary/Chest: Effort normal and breath sounds normal. She has no wheezes.  Abdominal: Soft. Bowel sounds are normal. There is no tenderness.  Musculoskeletal:       Right knee: She exhibits decreased range of motion. She exhibits no swelling. No tenderness found.       Left  knee: She exhibits decreased range of motion. She exhibits no swelling.  Crepitus in both knee joints with flexion, nominal swelling, limited range of motion  Neurological: She is alert and oriented to person, place, and time.  Psychiatric: Mood, memory, affect and judgment normal.  Nursing note and vitals reviewed.     Assessment & Plan  1. Essential hypertension BP stable on present antihypertensive treatment - metoprolol tartrate (LOPRESSOR) 100 MG tablet; Take 1 tablet (100 mg total) by mouth 2 (two) times daily.  Dispense: 180 tablet; Refill: 0 - olmesartan-hydrochlorothiazide (BENICAR HCT) 20-12.5 MG tablet; Take 1 tablet by mouth daily.  Dispense: 90 tablet; Refill: 0  2. Gastroesophageal reflux disease, esophagitis presence not specified Symptoms of reflux are stable on PPI taken every day - omeprazole (PRILOSEC) 20 MG capsule; Take 1 capsule (20 mg total) by mouth daily.  Dispense: 90 capsule; Refill:  0  3. Hyperlipidemia, unspecified hyperlipidemia type Obtain FLP, continue on statin - atorvastatin (LIPITOR) 40 MG tablet; Take 1 tablet (40 mg total) by mouth at bedtime.  Dispense: 90 tablet; Refill: 0 - Lipid panel  4. Anxiety  - ALPRAZolam (XANAX) 0.25 MG tablet; Take 1 tablet (0.25 mg total) by mouth 2 (two) times daily as needed for anxiety.  Dispense: 60 tablet; Refill: 2  5. Primary osteoarthritis of both knees I have asked her to follow-up with orthopedist she may benefit from other treatment options, continue meloxicam until then, cautioned about the long-term effect of meloxicam on kidneys., - meloxicam (MOBIC) 7.5 MG tablet; Take 1 tablet (7.5 mg total) by mouth daily.  Dispense: 30 tablet; Refill: 2   Jemima Petko Asad A. [facility] 10/13/2017 8:44 AM

## 2017-10-15 ENCOUNTER — Other Ambulatory Visit: Payer: Self-pay | Admitting: Family Medicine

## 2017-10-15 DIAGNOSIS — I10 Essential (primary) hypertension: Secondary | ICD-10-CM

## 2017-10-31 DIAGNOSIS — M65331 Trigger finger, right middle finger: Secondary | ICD-10-CM | POA: Diagnosis not present

## 2017-10-31 DIAGNOSIS — M65342 Trigger finger, left ring finger: Secondary | ICD-10-CM | POA: Diagnosis not present

## 2017-11-02 DIAGNOSIS — M170 Bilateral primary osteoarthritis of knee: Secondary | ICD-10-CM | POA: Diagnosis not present

## 2017-11-14 DIAGNOSIS — M170 Bilateral primary osteoarthritis of knee: Secondary | ICD-10-CM | POA: Diagnosis not present

## 2017-11-21 DIAGNOSIS — M170 Bilateral primary osteoarthritis of knee: Secondary | ICD-10-CM | POA: Diagnosis not present

## 2017-11-28 DIAGNOSIS — M170 Bilateral primary osteoarthritis of knee: Secondary | ICD-10-CM | POA: Diagnosis not present

## 2018-01-09 ENCOUNTER — Other Ambulatory Visit: Payer: Self-pay

## 2018-01-09 DIAGNOSIS — I10 Essential (primary) hypertension: Secondary | ICD-10-CM

## 2018-01-09 MED ORDER — OLMESARTAN MEDOXOMIL-HCTZ 20-12.5 MG PO TABS: 1.0000 | ORAL_TABLET | Freq: Every day | ORAL | 0 refills | Status: DC

## 2018-01-09 MED ORDER — METOPROLOL TARTRATE 100 MG PO TABS: 100.0000 mg | ORAL_TABLET | Freq: Two times a day (BID) | ORAL | 0 refills | Status: DC

## 2018-01-09 NOTE — Telephone Encounter (Signed)
Hypertension medication request: Metoprolol and Olmesartan to [facility].   Last office visit pertaining to hypertension: 10/13/2017   BP Readings from Last 3 Encounters:  10/13/17 114/68  07/21/17 130/72  07/13/17 (!) 148/92    Lab Results  Component Value Date   CREATININE 0.88 04/11/2017   BUN 18 04/11/2017   NA 140 04/11/2017   K 4.3 04/11/2017   CL 103 04/11/2017   CO2 25 04/11/2017    Follow up 01/18/2018

## 2018-01-10 DIAGNOSIS — M1711 Unilateral primary osteoarthritis, right knee: Secondary | ICD-10-CM | POA: Diagnosis not present

## 2018-01-10 DIAGNOSIS — M1712 Unilateral primary osteoarthritis, left knee: Secondary | ICD-10-CM | POA: Diagnosis not present

## 2018-01-15 ENCOUNTER — Other Ambulatory Visit: Payer: Self-pay | Admitting: Orthopedic Surgery

## 2018-01-15 DIAGNOSIS — M1712 Unilateral primary osteoarthritis, left knee: Secondary | ICD-10-CM

## 2018-01-18 ENCOUNTER — Encounter: Payer: Self-pay | Admitting: Family Medicine

## 2018-01-18 ENCOUNTER — Ambulatory Visit (INDEPENDENT_AMBULATORY_CARE_PROVIDER_SITE_OTHER): Payer: Medicare Other | Admitting: Family Medicine

## 2018-01-18 VITALS — BP 140/86 | HR 58 | Resp 16 | Ht 61.0 in | Wt 211.8 lb

## 2018-01-18 DIAGNOSIS — E038 Other specified hypothyroidism: Secondary | ICD-10-CM

## 2018-01-18 DIAGNOSIS — Z1159 Encounter for screening for other viral diseases: Secondary | ICD-10-CM | POA: Diagnosis not present

## 2018-01-18 DIAGNOSIS — E119 Type 2 diabetes mellitus without complications: Secondary | ICD-10-CM | POA: Diagnosis not present

## 2018-01-18 DIAGNOSIS — E669 Obesity, unspecified: Secondary | ICD-10-CM

## 2018-01-18 DIAGNOSIS — E790 Hyperuricemia without signs of inflammatory arthritis and tophaceous disease: Secondary | ICD-10-CM

## 2018-01-18 DIAGNOSIS — F320 Major depressive disorder, single episode, mild: Secondary | ICD-10-CM

## 2018-01-18 DIAGNOSIS — M170 Bilateral primary osteoarthritis of knee: Secondary | ICD-10-CM

## 2018-01-18 DIAGNOSIS — I10 Essential (primary) hypertension: Secondary | ICD-10-CM | POA: Diagnosis not present

## 2018-01-18 DIAGNOSIS — K219 Gastro-esophageal reflux disease without esophagitis: Secondary | ICD-10-CM | POA: Diagnosis not present

## 2018-01-18 DIAGNOSIS — E039 Hypothyroidism, unspecified: Secondary | ICD-10-CM

## 2018-01-18 DIAGNOSIS — F411 Generalized anxiety disorder: Secondary | ICD-10-CM | POA: Diagnosis not present

## 2018-01-18 DIAGNOSIS — I340 Nonrheumatic mitral (valve) insufficiency: Secondary | ICD-10-CM | POA: Insufficient documentation

## 2018-01-18 DIAGNOSIS — E1169 Type 2 diabetes mellitus with other specified complication: Secondary | ICD-10-CM

## 2018-01-18 DIAGNOSIS — R7989 Other specified abnormal findings of blood chemistry: Secondary | ICD-10-CM

## 2018-01-18 DIAGNOSIS — Z1211 Encounter for screening for malignant neoplasm of colon: Secondary | ICD-10-CM

## 2018-01-18 LAB — COMPLETE METABOLIC PANEL WITH GFR
AG Ratio: 1.2 (calc) (ref 1.0–2.5)
ALT: 7 U/L (ref 6–29)
AST: 12 U/L (ref 10–35)
Albumin: 3.9 g/dL (ref 3.6–5.1)
Alkaline phosphatase (APISO): 106 U/L (ref 33–130)
BUN/Creatinine Ratio: 21 (calc) (ref 6–22)
BUN: 27 mg/dL — AB (ref 7–25)
CO2: 28 mmol/L (ref 20–32)
Calcium: 9.6 mg/dL (ref 8.6–10.4)
Chloride: 101 mmol/L (ref 98–110)
Creat: 1.3 mg/dL — ABNORMAL HIGH (ref 0.50–0.99)
GFR, EST AFRICAN AMERICAN: 48 mL/min/{1.73_m2} — AB (ref >=60)
GFR, Est Non African American: 42 mL/min/{1.73_m2} — ABNORMAL LOW (ref >=60)
GLUCOSE: 98 mg/dL (ref 65–99)
Globulin: 3.2 g/dL (calc) (ref 1.9–3.7)
Potassium: 4.2 mmol/L (ref 3.5–5.3)
Sodium: 140 mmol/L (ref 135–146)
TOTAL PROTEIN: 7.1 g/dL (ref 6.1–8.1)
Total Bilirubin: 0.5 mg/dL (ref 0.2–1.2)

## 2018-01-18 LAB — TSH: TSH: 3.74 m[IU]/L (ref 0.40–4.50)

## 2018-01-18 LAB — POCT GLYCOSYLATED HEMOGLOBIN (HGB A1C): Hemoglobin A1C: 5.5

## 2018-01-18 LAB — POCT UA - MICROALBUMIN: Microalbumin Ur, POC: 20 mg/L

## 2018-01-18 MED ORDER — METOPROLOL TARTRATE 100 MG PO TABS: 100.0000 mg | ORAL_TABLET | Freq: Two times a day (BID) | ORAL | 1 refills | Status: DC

## 2018-01-18 MED ORDER — DULOXETINE HCL 30 MG PO CPEP: 30.0000 mg | ORAL_CAPSULE | Freq: Every day | ORAL | 0 refills | Status: DC

## 2018-01-18 MED ORDER — MELOXICAM 15 MG PO TABS: 15.0000 mg | ORAL_TABLET | Freq: Every day | ORAL | Status: DC

## 2018-01-18 MED ORDER — OMEPRAZOLE 20 MG PO CPDR: 20.0000 mg | DELAYED_RELEASE_CAPSULE | Freq: Every day | ORAL | 0 refills | Status: DC

## 2018-01-18 MED ORDER — ROSUVASTATIN CALCIUM 40 MG PO TABS: 40.0000 mg | ORAL_TABLET | Freq: Every day | ORAL | 1 refills | Status: DC

## 2018-01-18 MED ORDER — OLMESARTAN MEDOXOMIL-HCTZ 20-12.5 MG PO TABS: 1.0000 | ORAL_TABLET | Freq: Every day | ORAL | 1 refills | Status: DC

## 2018-01-18 NOTE — Progress Notes (Signed)
Name: Hayley Simmons   MRN: [id number]    DOB: [birth year]   Date:01/18/2018       Progress Note  Subjective  Chief Complaint  Chief Complaint  Patient presents with  . Diabetes  . Hypertension  . Hyperlipidemia  . Anxiety    HPI  DMII: diagnosed July 2018, she has never been on medication. Denies polyphagia, polydipsia or polyuria. She is due for eye exam, we will check urine micro today.   Morbid obesity: she has lost 11 lbs since last visit, she states she is eating less because of knee pain, unable to go to the kitchen all the time. Discussed importance of losing weight to improve cholesterol, and also knee pain, also advised to have colon cancer screen  Elevate TSH, subclinical hypothyroidism. We will recheck labs, losing weight, She has mild constipation, bowel movements every other day. She is on Tramadol  Dyslipidemia: on lipitor but LDL not at goal, we will change to crestor 40 mg, no chest pain or palpitatoin  HTN: bp slightly elevated today, she states at goal at home, she is in pain - knee, and not sleeping well, we will monitor for now. Bradycardia likely from metoprolol.   GERD: doing better, taking medication every other, discussed long term risk of PPI, such as dementia, osteoporosis, colitis and heart disease.   Major Depression/ Anxiety: going on for years, struggles with relationship with her daughter, and it makes her sad. She has been taking alprazolam prn, we will stop that and try duloxetine to help with mood and pain, discussed possible risk with use of Tramadol. She states she is willing to stop tramadol since it does not work for her pain.    Patient Active Problem List   Diagnosis Date Noted  . MI (mitral incompetence) 01/18/2018  . Diabetes mellitus type 2 in obese ([place]) 06/21/2016  . Elevated TSH 05/19/2016  . Osteoarthritis of both knees 06/24/2015  . Female stress incontinence 06/24/2015  . Hypertension 05/19/2015  . GERD (gastroesophageal reflux  disease) 05/19/2015  . Hyperlipidemia 05/19/2015  . At risk for falling 05/19/2015  . Obesity, morbid ([place]) 05/19/2015    Past Surgical History:  Procedure Laterality Date  . ABDOMINAL HYSTERECTOMY  1994  . CARPAL TUNNEL RELEASE Right 07/29/2008    Family History  Problem Relation Age of Onset  . Gout Mother   . Dementia Mother   . Kidney disease Mother   . Cancer Mother        kidney  . Heart attack Mother   . Heart attack Father   . Cancer Brother     Social History   Socioeconomic History  . Marital status: Married    Spouse name: Neldon Newport   . Number of children: 1  . Years of education: Not on file  . Highest education level: 12th grade  Occupational History  . 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 Financial resource strain: Not on file  . Food insecurity:    Worry: Not on file    Inability: Not on file  . Transportation needs:    Medical: Not on file    Non-medical: Not on file  Tobacco Use  . Smoking status: Former Smoker    Packs/day: 0.25    Years: 31.00    Pack years: 7.75    Types: Cigarettes    Last attempt to quit: 11/18/1997  Years since quitting: 20.1  . Smokeless tobacco: Never Used  . Tobacco comment: quit in 11/1997  Substance and Sexual Activity  . Alcohol use: No    Alcohol/week: 0.0 oz  . Drug use: No  . Sexual activity: Not Currently  Lifestyle  . Physical activity:    Days per week: Not on file    Minutes per session: Not on file  . Stress: Not on file  Relationships  . Social connections:    Talks on phone: Not on file    Gets together: Not on file    Attends religious service: Not on file    Active member of club or organization: Not on file    Attends meetings of clubs or organizations: Not on file    Relationship status: Not on file  . Intimate partner violence:    Fear of current or ex partner: Not on file    Emotionally abused: Not on  file    Physically abused: Not on file    Forced sexual activity: Not on file  Other Topics Concern  . Not on file  Social History Narrative  . Not on file     Current Outpatient Medications:  .  aspirin 81 MG tablet, Take by mouth., Disp: , Rfl:  .  CALCIUM CARBONATE-VIT D-MIN PO, Take 600 mg by mouth 2 (two) times daily. , Disp: , Rfl:  .  diclofenac sodium (VOLTAREN) 1 % GEL, diclofenac 1 % topical gel  APP 2 GRAMS AA QID, Disp: , Rfl:  .  meloxicam (MOBIC) 15 MG tablet, Take 1 tablet (15 mg total) by mouth daily., Disp: , Rfl:  .  metoprolol tartrate (LOPRESSOR) 100 MG tablet, Take 1 tablet (100 mg total) by mouth 2 (two) times daily., Disp: 180 tablet, Rfl: 1 .  olmesartan-hydrochlorothiazide (BENICAR HCT) 20-12.5 MG tablet, Take 1 tablet by mouth daily., Disp: 90 tablet, Rfl: 1 .  omeprazole (PRILOSEC) 20 MG capsule, Take 1 capsule (20 mg total) by mouth daily., Disp: 90 capsule, Rfl: 0 .  traMADol (ULTRAM) 50 MG tablet, tramadol 50 mg tablet  Take 1 tablet every 6 hours by oral route as needed., Disp: , Rfl:  .  DULoxetine (CYMBALTA) 30 MG capsule, Take 1-2 capsules (30-60 mg total) by mouth daily. 1 for the first week after that 2 daily, Disp: 60 capsule, Rfl: 0 .  rosuvastatin (CRESTOR) 40 MG tablet, Take 1 tablet (40 mg total) by mouth daily. In place of Atorvastatin, Disp: 90 tablet, Rfl: 1  Allergies  Allergen Reactions  . Caduet  [Amlodipine-Atorvastatin] Other (See Comments)    Makes Pt feel bad makes pt feel bad  . Cyclosporine Other (See Comments), Nausea And Vomiting and Nausea Only  . Escitalopram Other (See Comments)     ROS  Constitutional: Negative for fever , positive for weight weight.  Respiratory: Negative for cough and shortness of breath.   Cardiovascular: Negative for chest pain or palpitations.  Gastrointestinal: Negative for abdominal pain, no bowel changes.  Musculoskeletal:Positive for gait problem but  joint swelling.  Skin: Negative for rash.   Neurological: Negative for dizziness or headache.  No other specific complaints in a complete review of systems (except as listed in HPI above).  Objective  Vitals:   01/18/18 0733  BP: 140/86  Pulse: (!) 58  Resp: 16  SpO2: 98%  Weight: 211 lb 12.8 oz (96.1 kg)  Height: 5\' 1"  (1.549 m)    Body mass index is 40.02 kg/m.  Physical Exam  Constitutional: Patient appears well-developed and well-nourished. Obese  No distress.  HEENT: head atraumatic, normocephalic, pupils equal and reactive to light,, neck supple, throat within normal limits Cardiovascular: Normal rate, regular rhythm and normal heart sounds.  Sejection murmur 2/6 No BLE edema. Pulmonary/Chest: Effort normal and breath sounds normal. No respiratory distress. Abdominal: Soft.  There is no tenderness. Psychiatric: Patient has a normal mood and affect. behavior is normal. Judgment and thought content normal. Muscular Skeletal: antalgic and slow gait, using cane  Recent Results (from the past 2160 hour(s))  POCT HgB A1C     Status: Normal   Collection Time: 01/18/18  7:56 AM  Result Value Ref Range   Hemoglobin A1C 5.5     Diabetic Foot Exam: Diabetic Foot Exam - Simple   Simple Foot Form Visual Inspection No deformities, no ulcerations, no other skin breakdown bilaterally:  Yes Sensation Testing Intact to touch and monofilament testing bilaterally:  Yes Pulse Check Posterior Tibialis and Dorsalis pulse intact bilaterally:  Yes Comments      PHQ2/9: Depression screen [facility] 2/9 10/13/2017 07/13/2017 04/11/2017 11/07/2016 06/21/2016  Decreased Interest 0 0 0 0 0  Down, Depressed, Hopeless 0 0 0 0 0  PHQ - 2 Score 0 0 0 0 0     Fall Risk: Fall Risk  01/18/2018 10/13/2017 07/13/2017 04/11/2017 11/07/2016  Falls in the past year? No No No No No    Functional Status Survey: Is the patient deaf or have difficulty hearing?: No Does the patient have difficulty seeing, even when wearing glasses/contacts?: No Does  the patient have difficulty concentrating, remembering, or making decisions?: No Does the patient have difficulty walking or climbing stairs?: Yes Does the patient have difficulty dressing or bathing?: No Does the patient have difficulty doing errands alone such as visiting a doctor's office or shopping?: No   Assessment & Plan   1. Diabetes mellitus type 2 in obese (HCC)  - POCT HgB A1C - COMPLETE METABOLIC PANEL WITH GFR   2. Colon cancer screening  - Cologuard  3. Morbid obesity due to excess calories [facility])  Discussed with the patient the risk posed by an increased BMI. Discussed importance of portion control, calorie counting and at least 150 minutes of physical activity weekly. Avoid sweet beverages and drink more water. Eat at least 6 servings of fruit and vegetables daily   4. Elevated TSH  - TSH  5. Gastroesophageal reflux disease, esophagitis presence not specified  Under control at this time  6. Primary osteoarthritis of both knees  Seeing Dr. Harlow Mares and is going to have knee replacement left side June 2019  7. Essential hypertension  Slightly elevated   8. Abnormal blood level of uric acid  Never had gout before   9. Need for hepatitis C screening test  - Hepatitis C Antibody  10. Mild major depression (HCC)  - DULoxetine (CYMBALTA) 30 MG capsule; Take 1-2 capsules (30-60 mg total) by mouth daily. 1 for the first week after that 2 daily  Dispense: 60 capsule; Refill: 0  11. GAD (generalized anxiety disorder)  - DULoxetine (CYMBALTA) 30 MG capsule; Take 1-2 capsules (30-60 mg total) by mouth daily. 1 for the first week after that 2 daily  Dispense: 60 capsule; Refill: 0 Discussed QT prolongation , she states she will stop tramadol since it is not working   12. Subclinical hypothyroidism  - TSH

## 2018-01-18 NOTE — Addendum Note (Signed)
Addended by: Chilton Greathouse on: 01/18/2018 09:28 AM   Modules accepted: Orders

## 2018-01-19 LAB — HEPATITIS C ANTIBODY
Hepatitis C Ab: NONREACTIVE
SIGNAL TO CUT-OFF: 0.02 (ref <1.00)

## 2018-01-20 ENCOUNTER — Ambulatory Visit
Admission: RE | Admit: 2018-01-20 | Discharge: 2018-01-20 | Disposition: A | Discharge status: Home / Self Care | Payer: Medicare Other | Source: Ambulatory Visit | Attending: Orthopedic Surgery | Admitting: Orthopedic Surgery

## 2018-01-20 DIAGNOSIS — X58XXXA Exposure to other specified factors, initial encounter: Secondary | ICD-10-CM | POA: Insufficient documentation

## 2018-01-20 DIAGNOSIS — M1712 Unilateral primary osteoarthritis, left knee: Secondary | ICD-10-CM | POA: Diagnosis not present

## 2018-01-20 DIAGNOSIS — M659 Synovitis and tenosynovitis, unspecified: Secondary | ICD-10-CM | POA: Diagnosis not present

## 2018-01-20 DIAGNOSIS — S83242A Other tear of medial meniscus, current injury, left knee, initial encounter: Secondary | ICD-10-CM | POA: Diagnosis not present

## 2018-01-23 ENCOUNTER — Encounter: Payer: Self-pay | Admitting: Family Medicine

## 2018-01-23 DIAGNOSIS — N183 Chronic kidney disease, stage 3 unspecified: Secondary | ICD-10-CM | POA: Insufficient documentation

## 2018-01-24 ENCOUNTER — Ambulatory Visit
Admission: RE | Admit: 2018-01-24 | Discharge: 2018-01-24 | Disposition: A | Discharge status: Home / Self Care | Payer: Medicare Other | Source: Ambulatory Visit | Attending: Orthopedic Surgery | Admitting: Orthopedic Surgery

## 2018-01-24 ENCOUNTER — Other Ambulatory Visit: Payer: Self-pay | Admitting: Orthopedic Surgery

## 2018-01-24 DIAGNOSIS — M1712 Unilateral primary osteoarthritis, left knee: Secondary | ICD-10-CM

## 2018-01-26 ENCOUNTER — Ambulatory Visit: Payer: Self-pay | Admitting: Orthopedic Surgery

## 2018-02-01 DIAGNOSIS — M1712 Unilateral primary osteoarthritis, left knee: Secondary | ICD-10-CM | POA: Diagnosis not present

## 2018-02-05 ENCOUNTER — Telehealth: Payer: Self-pay | Admitting: Family Medicine

## 2018-02-05 NOTE — Telephone Encounter (Signed)
Pt called inquiring of status of clearance form .  cb is [phone]

## 2018-02-05 NOTE — Telephone Encounter (Signed)
Sherry from [facility] is going to send over another form for Mrs. Vallandingham's surgical clearance.

## 2018-02-05 NOTE — Telephone Encounter (Signed)
Patient has an appointment schedule to see Benjamine Mola at [facility] on Wednesday. Her pre-op appointment is at 9:30. She will need and EKG, urinalysis and cbc.

## 2018-02-05 NOTE — Telephone Encounter (Signed)
Forms have been placed in PCP folder. Waiting on her signature and approval.

## 2018-02-05 NOTE — Telephone Encounter (Signed)
I did not see her for pre-op 05/02, she needs to come in for pre-op before clearance

## 2018-02-05 NOTE — Telephone Encounter (Signed)
Copied from [place] [id number]. Topic: Quick Communication - See Telephone Encounter >> Feb 05, 2018  9:29 AM Cleaster Corin, NT wrote: CRM for notification. See Telephone encounter for: 02/05/18. Sherry calling from [employer] calling to check the status of surgery clearance for pt. Fax was sent on Apri 24th surgery is on June 3rd. Judeen Hammans can be reached at [phone] ext. [phone]

## 2018-02-07 ENCOUNTER — Ambulatory Visit
Admission: RE | Admit: 2018-02-07 | Discharge: 2018-02-07 | Disposition: A | Discharge status: Home / Self Care | Payer: Medicare Other | Source: Ambulatory Visit | Attending: Nurse Practitioner | Admitting: Nurse Practitioner

## 2018-02-07 ENCOUNTER — Other Ambulatory Visit: Payer: Self-pay

## 2018-02-07 ENCOUNTER — Ambulatory Visit (INDEPENDENT_AMBULATORY_CARE_PROVIDER_SITE_OTHER): Payer: Medicare Other | Admitting: Nurse Practitioner

## 2018-02-07 ENCOUNTER — Encounter: Payer: Self-pay | Admitting: Nurse Practitioner

## 2018-02-07 ENCOUNTER — Encounter
Admission: RE | Admit: 2018-02-07 | Discharge: 2018-02-07 | Disposition: A | Discharge status: Home / Self Care | Payer: Medicare Other | Source: Ambulatory Visit | Attending: Orthopedic Surgery | Admitting: Orthopedic Surgery

## 2018-02-07 ENCOUNTER — Ambulatory Visit
Admission: RE | Admit: 2018-02-07 | Discharge: 2018-02-07 | Disposition: A | Discharge status: Home / Self Care | Payer: Medicare Other | Source: Ambulatory Visit | Attending: Family Medicine | Admitting: Family Medicine

## 2018-02-07 ENCOUNTER — Encounter
Admission: RE | Admit: 2018-02-07 | Discharge: 2018-02-07 | Disposition: A | Discharge status: Home / Self Care | Payer: Medicare Other | Source: Ambulatory Visit | Attending: Family Medicine | Admitting: Family Medicine

## 2018-02-07 VITALS — BP 122/78 | HR 84 | Temp 98.6°F | Resp 16 | Ht 61.0 in | Wt 206.3 lb

## 2018-02-07 DIAGNOSIS — R05 Cough: Secondary | ICD-10-CM

## 2018-02-07 DIAGNOSIS — I10 Essential (primary) hypertension: Secondary | ICD-10-CM | POA: Diagnosis not present

## 2018-02-07 DIAGNOSIS — Z01812 Encounter for preprocedural laboratory examination: Secondary | ICD-10-CM | POA: Diagnosis not present

## 2018-02-07 DIAGNOSIS — R001 Bradycardia, unspecified: Secondary | ICD-10-CM | POA: Insufficient documentation

## 2018-02-07 DIAGNOSIS — E669 Obesity, unspecified: Secondary | ICD-10-CM | POA: Diagnosis not present

## 2018-02-07 DIAGNOSIS — N183 Chronic kidney disease, stage 3 unspecified: Secondary | ICD-10-CM

## 2018-02-07 DIAGNOSIS — Z01810 Encounter for preprocedural cardiovascular examination: Secondary | ICD-10-CM | POA: Insufficient documentation

## 2018-02-07 DIAGNOSIS — R059 Cough, unspecified: Secondary | ICD-10-CM

## 2018-02-07 DIAGNOSIS — E785 Hyperlipidemia, unspecified: Secondary | ICD-10-CM | POA: Insufficient documentation

## 2018-02-07 DIAGNOSIS — R0989 Other specified symptoms and signs involving the circulatory and respiratory systems: Secondary | ICD-10-CM

## 2018-02-07 DIAGNOSIS — Z01818 Encounter for other preprocedural examination: Secondary | ICD-10-CM | POA: Diagnosis not present

## 2018-02-07 DIAGNOSIS — H6123 Impacted cerumen, bilateral: Secondary | ICD-10-CM

## 2018-02-07 DIAGNOSIS — E1169 Type 2 diabetes mellitus with other specified complication: Secondary | ICD-10-CM

## 2018-02-07 DIAGNOSIS — E786 Lipoprotein deficiency: Secondary | ICD-10-CM | POA: Diagnosis not present

## 2018-02-07 HISTORY — DX: Zoster without complications: B02.9

## 2018-02-07 HISTORY — DX: Unspecified osteoarthritis, unspecified site: M19.90

## 2018-02-07 LAB — URINALYSIS, ROUTINE W REFLEX MICROSCOPIC
BILIRUBIN URINE: NEGATIVE
Glucose, UA: NEGATIVE mg/dL
Hgb urine dipstick: NEGATIVE
KETONES UR: NEGATIVE mg/dL
Leukocytes, UA: NEGATIVE
Nitrite: NEGATIVE
PROTEIN: 100 mg/dL — AB
Specific Gravity, Urine: 1.027 (ref 1.005–1.030)
pH: 6 (ref 5.0–8.0)

## 2018-02-07 LAB — BASIC METABOLIC PANEL
Anion gap: 10 (ref 5–15)
BUN: 22 mg/dL — ABNORMAL HIGH (ref 6–20)
CHLORIDE: 99 mmol/L — AB (ref 101–111)
CO2: 28 mmol/L (ref 22–32)
CREATININE: 1.11 mg/dL — AB (ref 0.44–1.00)
Calcium: 9.8 mg/dL (ref 8.9–10.3)
GFR calc Af Amer: 57 mL/min — ABNORMAL LOW (ref >60)
GFR calc non Af Amer: 49 mL/min — ABNORMAL LOW (ref >60)
GLUCOSE: 99 mg/dL (ref 65–99)
Potassium: 3.6 mmol/L (ref 3.5–5.1)
Sodium: 137 mmol/L (ref 135–145)

## 2018-02-07 LAB — LIPID PANEL
CHOL/HDL RATIO: 4.1 ratio
CHOLESTEROL: 152 mg/dL (ref 0–200)
HDL: 37 mg/dL — AB (ref >40)
LDL Cholesterol: 77 mg/dL (ref 0–99)
Triglycerides: 191 mg/dL — ABNORMAL HIGH (ref <150)
VLDL: 38 mg/dL (ref 0–40)

## 2018-02-07 LAB — CBC
HEMATOCRIT: 38.6 % (ref 35.0–47.0)
Hemoglobin: 12.9 g/dL (ref 12.0–16.0)
MCH: 28.7 pg (ref 26.0–34.0)
MCHC: 33.4 g/dL (ref 32.0–36.0)
MCV: 85.9 fL (ref 80.0–100.0)
PLATELETS: 414 10*3/uL (ref 150–440)
RBC: 4.5 MIL/uL (ref 3.80–5.20)
RDW: 14 % (ref 11.5–14.5)
WBC: 13.2 10*3/uL — AB (ref 3.6–11.0)

## 2018-02-07 LAB — SURGICAL PCR SCREEN
MRSA, PCR: NEGATIVE
STAPHYLOCOCCUS AUREUS: NEGATIVE

## 2018-02-07 LAB — TYPE AND SCREEN
ABO/RH(D): A POS
Antibody Screen: NEGATIVE

## 2018-02-07 LAB — APTT: aPTT: 28 seconds (ref 24–36)

## 2018-02-07 LAB — PROTIME-INR
INR: 1.05
Prothrombin Time: 13.6 seconds (ref 11.4–15.2)

## 2018-02-07 NOTE — Patient Instructions (Signed)
Your procedure is scheduled on: Monday 02/19/18 Report to [place]. To find out your arrival time please call [phone] between 1PM - 3PM on Friday 02/16/18.  Remember: Instructions that are not followed completely may result in serious medical risk, up to and including death, or upon the discretion of your surgeon and anesthesiologist your surgery may need to be rescheduled.     _X__ 1. Do not eat food after midnight the night before your procedure.                 No gum chewing or hard candies. You may drink clear liquids up to 2 hours                 before you are scheduled to arrive for your surgery- DO not drink clear                 liquids within 2 hours of the start of your surgery.                 Clear Liquids include:  water, apple juice without pulp, clear carbohydrate                 drink such as Clearfast or Gatorade, Black Coffee or Tea (Do not add                 anything to coffee or tea).  __X__2.  On the morning of surgery brush your teeth with toothpaste and water, you                 may rinse your mouth with mouthwash if you wish.  Do not swallow any              toothpaste of mouthwash.     _X__ 3.  No Alcohol for 24 hours before or after surgery.   _X__ 4.  Do Not Smoke or use e-cigarettes For 24 Hours Prior to Your Surgery.                 Do not use any chewable tobacco products for at least 6 hours prior to                 surgery.  ____  5.  Bring all medications with you on the day of surgery if instructed.   __X__  6.  Notify your doctor if there is any change in your medical condition      (cold, fever, infections).     Do not wear jewelry, make-up, hairpins, clips or nail polish. Do not wear lotions, powders, or perfumes.  Do not shave 48 hours prior to surgery. Men may shave face and neck. Do not bring valuables to the hospital.    [facility] is not responsible for any belongings or  valuables.  Contacts, dentures/partials or body piercings may not be worn into surgery. Bring a case for your contacts, glasses or hearing aids, a denture cup will be supplied. Leave your suitcase in the car. After surgery it may be brought to your room. For patients admitted to the hospital, discharge time is determined by your treatment team.   Patients discharged the day of surgery will not be allowed to drive home.   Please read over the following fact sheets that you were given:   MRSA Information  __X__ Take these medicines the morning of surgery with A SIP OF WATER:  1. Duloxetine  2. Metoprolol  3. Omeprazole  4.  5.  6.  ____ Fleet Enema (as directed)   __X__ Use CHG Soap/SAGE wipes as directed  ____ Use inhalers on the day of surgery  ____ Stop metformin/Janumet/Farxiga 2 days prior to surgery    ____ Take 1/2 of usual insulin dose the night before surgery. No insulin the morning          of surgery.   __X__ Stop Blood Thinners Coumadin/Plavix/Xarelto/Pleta/Pradaxa/Eliquis/Effient/Aspirin  on 02/12/18  Or contact your Surgeon, Cardiologist or Medical Doctor regarding  ability to stop your blood thinners  __X__ Stop Anti-inflammatories 7 days before surgery such as Advil, Ibuprofen, Motrin,  BC or Goodies Powder, Naprosyn, Naproxen, Aleve, Aspirin    __X__ Stop all herbal supplements, fish oil or vitamin E until after surgery.    ____ Bring C-Pap to the hospital.

## 2018-02-07 NOTE — Progress Notes (Addendum)
Name: Hayley Simmons   MRN: [id number]    DOB: 07-23-1946   Date:02/07/2018       Progress Note  Subjective  Chief Complaint  Chief Complaint  Patient presents with  . Knee Pain    Surgical clearance for left knee on 02/19/18    HPI  Patient presents for surgical clearance for left total knee arthroplasty with Dr. Harlow Mares, [place]. Has osteoarthritis in bilateral knees states is to get left knee first then right knee. Patient takes cymbalta 30mg , in the morning  And tramadol 50mg  in the evening. Takes tylenol PRN. Patient uses voltaren gel BID.    Patient notes productive cough started 3 days ago worse at night. Denies fevers, chills, sore throat, shob, states feels some wheezing occasionally.   Hypertension: blood pressure at goal today. Pt taking metoprolol tartrate 100mg , olmesartan 20mg  and HCTZ 12.5mg . No missed doses. Denies chest pain, blurry vision, headache, dizziness.  BP Readings from Last 3 Encounters:  02/07/18 122/78  01/18/18 140/86  10/13/17 114/68   GERD: pt takes omeprazole 20mg  takes 1-2 times a week symptoms are well controlled. States triggered by greasy foods.   DM2: Last A1C at goal - diet controlled, on olmesartan for nephroprotection and management of HTN. Lab Results  Component Value Date   HGBA1C 5.5 01/18/2018   CKD:   Lab Results  Component Value Date   CREATININE 1.30 (H) 01/18/2018   Lab Results  Component Value Date   BUN 27 (H) 01/18/2018   HLD: pt taking crestor 40mg  taking daily and improving diet.   Lab Results  Component Value Date   CHOL 168 10/13/2017   HDL 36 (L) 10/13/2017   LDLCALC 103 (H) 10/13/2017   TRIG 171 (H) 10/13/2017   CHOLHDL 4.7 10/13/2017    Obesity Patient tried diet pill in the past and kick started weight loss, states stopped the pill months ago but has cut down portions significantly and making healthier food choices- minimal sweets, bread and junkfood.  Wt Readings from Last 3 Encounters:  02/07/18 206  lb 4.8 oz (93.6 kg)  01/18/18 211 lb 12.8 oz (96.1 kg)  10/13/17 221 lb 12.8 oz (100.6 kg)   Elevated TSH Patient had elevated tsh in the past, 2 weeks ago was rechecked and normalized.  Denies constipation/diarrhea, fatigue, hot/cold intolerance, dry, brittle nails  Lab Results  Component Value Date   TSH 3.74 01/18/2018    Activity level has decreased over the past months due to increase knee pain states was using a cane but now require a walker, can not walk for more than 10 minutes due to pain.     RCRI score: 0 - HF, insulin, hx of CVA/TIA or ischemic heart disease, not vascular/intra thoracic/abdominal surgery, creatinine less than 2   Patient Active Problem List   Diagnosis Date Noted  . Chronic kidney disease, stage III (moderate) ([place]) 01/23/2018  . MI (mitral incompetence) 01/18/2018  . Diabetes mellitus type 2 in obese ([place]) 06/21/2016  . Elevated TSH 05/19/2016  . Osteoarthritis of both knees 06/24/2015  . Female stress incontinence 06/24/2015  . Hypertension 05/19/2015  . GERD (gastroesophageal reflux disease) 05/19/2015  . Hyperlipidemia 05/19/2015  . At risk for falling 05/19/2015  . Obesity, morbid ([place]) 05/19/2015    Past Medical History:  Diagnosis Date  . Allergy   . Anxiety   . GERD (gastroesophageal reflux disease)   . Hyperlipidemia   . Hypertension     Past Surgical History:  Procedure Laterality Date  .  ABDOMINAL HYSTERECTOMY  1994  . CARPAL TUNNEL RELEASE Right 07/29/2008    Social History   Tobacco Use  . Smoking status: Former Smoker    Packs/day: 0.25    Years: 31.00    Pack years: 7.75    Types: Cigarettes    Last attempt to quit: 11/18/1997    Years since quitting: 20.2  . Smokeless tobacco: Never Used  . Tobacco comment: quit in 11/1997  Substance Use Topics  . Alcohol use: No    Alcohol/week: 0.0 oz     Current Outpatient Medications:  .  acetaminophen (TYLENOL) 500 MG tablet, Take 1,000 mg by mouth every 6 (six) hours as  needed (for pain/headaches.)., Disp: , Rfl:  .  aspirin EC 81 MG tablet, Take 81 mg by mouth daily., Disp: , Rfl:  .  Calcium Carb-Cholecalciferol (CALCIUM 600+D3 PO), Take 1 tablet by mouth 2 (two) times daily., Disp: , Rfl:  .  diclofenac sodium (VOLTAREN) 1 % GEL, Apply 1-2 g topically 4 (four) times daily as needed (for pain.)., Disp: , Rfl:  .  DULoxetine (CYMBALTA) 30 MG capsule, Take 1-2 capsules (30-60 mg total) by mouth daily. 1 for the first week after that 2 daily, Disp: 60 capsule, Rfl: 0 .  fluticasone (FLONASE) 50 MCG/ACT nasal spray, Place 1-2 sprays into both nostrils daily as needed for allergies., Disp: , Rfl:  .  metoprolol tartrate (LOPRESSOR) 100 MG tablet, Take 1 tablet (100 mg total) by mouth 2 (two) times daily., Disp: 180 tablet, Rfl: 1 .  olmesartan-hydrochlorothiazide (BENICAR HCT) 20-12.5 MG tablet, Take 1 tablet by mouth daily., Disp: 90 tablet, Rfl: 1 .  omeprazole (PRILOSEC) 20 MG capsule, Take 1 capsule (20 mg total) by mouth daily. (Patient taking differently: Take 20 mg by mouth every other day. MORNING.), Disp: 90 capsule, Rfl: 0 .  rosuvastatin (CRESTOR) 40 MG tablet, Take 1 tablet (40 mg total) by mouth daily. In place of Atorvastatin (Patient taking differently: Take 40 mg by mouth every evening. In place of Atorvastatin), Disp: 90 tablet, Rfl: 1 .  traMADol (ULTRAM) 50 MG tablet, Take 50 mg by mouth daily., Disp: , Rfl:   Allergies  Allergen Reactions  . Caduet  [Amlodipine-Atorvastatin] Other (See Comments)    Makes Pt feel bad makes pt feel bad  . Cyclosporine Other (See Comments), Nausea And Vomiting and Nausea Only  . Escitalopram Other (See Comments)    ROS  Constitutional: Negative for fever  Positive for weight loss.  Respiratory: Positive for dry cough at night Positive for shortness of breath.   Cardiovascular: Negative for chest pain or palpitations.  Gastrointestinal: Negative for abdominal pain, no bowel changes.  Musculoskeletal: Positive  for gait problem- uses walker due to bilateral knee pain or joint swelling.  Skin: Negative for rash.  Neurological: Negative for dizziness or headache.  No other specific complaints in a complete review of systems (except as listed in HPI above).  Objective  Vitals:   02/07/18 0828  BP: 122/78  Pulse: 84  Resp: 16  Temp: 98.6 F (37 C)  TempSrc: Oral  SpO2: 94%  Weight: 206 lb 4.8 oz (93.6 kg)  Height: 5\' 1"  (1.549 m)    Body mass index is 38.98 kg/m.  Nursing Note and Vital Signs reviewed.  Physical Exam  Constitutional: Patient appears well-developed and well-nourished. Obese  No distress.  HEENT: head atraumatic, normocephalic, pupils equal and reactive to light, bilatearl ear canals impacted with cerumen,  no maxillary or frontal sinus tenderness,  neck supple without lymphadenopathy, oropharynx pink and moist without exudate, no nasal discharge Cardiovascular: Normal rate, regular rhythm, S1/S2 present.  Pulses intact, no carotid bruit noted.   Pulmonary/Chest: Effort normal and breath sounds clear. No respiratory distress or retractions. Abdominal: Soft and non-tender, bowel sounds present, no CVA tenderness.  MSK: upper extremity strength intact and equal. Patient bilateral knees swollen with limited movement without pain. Able to stand up using walker with slow but steady gait.  Psychiatric: Patient has a normal mood and affect. behavior is normal. Judgment and thought content normal.  No results found for this or any previous visit (from the past 72 hour(s)).  Assessment & Plan  1. Essential hypertension Stable continue medications  - EKG 12-Lead  Impression: normal sinus rhythm with no acute changes Patient completed EKG at pre-operative area due to in office EKG machine error showing abnormal reading and artifact after multiple attempts. Will rescind referral to cardiology- patient is asymptomatic of ischemic disease with normal EKG at this time. RCRI score of  0. - Basic Metabolic Panel (BMET)  2. Hyperlipidemia, unspecified hyperlipidemia type Stable cont diet and medication  - Lipid Profile  3. Diabetes mellitus type 2 in obese (HCC)  Stable continue medications - Basic Metabolic Panel (BMET)  4. Chronic kidney disease, stage III (moderate) (HCC) -avoid nsaids, drink plenty of fluids, monitor for improvement - Basic Metabolic Panel (BMET) - CBC  5. Cough -OTC treatment  - DG Chest 2 View; Future  6. Chest congestion - DG Chest 2 View; Future  Patient had to leave prior to cerumen impaction and further evaluation of cough symptoms- negative chest xray. Patient chronic diseases are stable at this time however due to cough recommend patient come in to be evaluated in one week to ensure resolution of symptoms prior to surgery to ensure adequate respiratory status prior to sedation.   -Red flags and when to present for emergency care or RTC including fever >101.21F, chest pain, shortness of breath, new/worsening/un-resolving symptoms, reviewed with patient at time of visit. Follow up and care instructions discussed and provided in AVS. -Reviewed Health Maintenance: will send in cologuard and schedule her eye exam  -------------------------------------------- I have reviewed this encounter including the documentation in this note and/or discussed this patient with the provider, Suezanne Cheshire DNP AGNP-C. I am certifying that I agree with the content of this note as supervising physician. Enid Derry, [facility] 02/09/2018, 1:31 PM

## 2018-02-09 ENCOUNTER — Telehealth: Payer: Self-pay | Admitting: Emergency Medicine

## 2018-02-09 NOTE — Telephone Encounter (Signed)
Called patient and asked her to make appointment to be rechecked again before surgery. Patient stated that her cough was much better and that she did not need another appointment. She stated she got ear wax removal for ears and all her test at hospital was ok.

## 2018-02-09 NOTE — Telephone Encounter (Signed)
-----   Message from Fredderick Severance, NP sent at 02/09/2018  9:51 AM EDT ----- Please call to see if cough is improving or if she needs medication. Would like to see her again due to cough prior to surgical clearance. Please schedule.

## 2018-02-14 ENCOUNTER — Encounter
Admission: RE | Admit: 2018-02-14 | Discharge: 2018-02-14 | Disposition: A | Discharge status: Home / Self Care | Payer: Medicare Other | Source: Ambulatory Visit | Attending: Orthopedic Surgery | Admitting: Orthopedic Surgery

## 2018-02-14 DIAGNOSIS — Z01812 Encounter for preprocedural laboratory examination: Secondary | ICD-10-CM | POA: Diagnosis not present

## 2018-02-14 LAB — URINALYSIS, ROUTINE W REFLEX MICROSCOPIC
Bilirubin Urine: NEGATIVE
Glucose, UA: NEGATIVE mg/dL
Hgb urine dipstick: NEGATIVE
Ketones, ur: 5 mg/dL — AB
Leukocytes, UA: NEGATIVE
Nitrite: NEGATIVE
PH: 5 (ref 5.0–8.0)
Protein, ur: 30 mg/dL — AB
SPECIFIC GRAVITY, URINE: 1.026 (ref 1.005–1.030)

## 2018-02-15 DIAGNOSIS — Z1211 Encounter for screening for malignant neoplasm of colon: Secondary | ICD-10-CM | POA: Diagnosis not present

## 2018-02-15 NOTE — Pre-Procedure Instructions (Signed)
CATH UA RESULTS FAXED TO DR Harlow Mares

## 2018-02-18 MED ORDER — CEFAZOLIN SODIUM-DEXTROSE 2-4 GM/100ML-% IV SOLN
2.0000 g | INTRAVENOUS | Status: AC
  Administered 2018-02-19: 2 g via INTRAVENOUS

## 2018-02-18 MED ORDER — TRANEXAMIC ACID 1000 MG/10ML IV SOLN
1000.0000 mg | INTRAVENOUS | Status: AC
  Administered 2018-02-19: 1000 mg via INTRAVENOUS
  Filled 2018-02-18: qty 1100

## 2018-02-19 ENCOUNTER — Inpatient Hospital Stay
Admission: RE | Admit: 2018-02-19 | Discharge: 2018-02-21 | DRG: 470 | Disposition: A | Discharge status: Home / Self Care | Payer: Medicare Other | Source: Ambulatory Visit | Attending: Orthopedic Surgery | Admitting: Orthopedic Surgery

## 2018-02-19 ENCOUNTER — Inpatient Hospital Stay: Payer: Medicare Other | Admitting: Anesthesiology

## 2018-02-19 ENCOUNTER — Other Ambulatory Visit: Payer: Self-pay

## 2018-02-19 ENCOUNTER — Encounter: Payer: Self-pay | Admitting: *Deleted

## 2018-02-19 ENCOUNTER — Encounter
Admission: RE | Disposition: A | Discharge status: Home / Self Care | Payer: Self-pay | Source: Ambulatory Visit | Attending: Orthopedic Surgery

## 2018-02-19 ENCOUNTER — Inpatient Hospital Stay: Payer: Medicare Other

## 2018-02-19 DIAGNOSIS — Z888 Allergy status to other drugs, medicaments and biological substances status: Secondary | ICD-10-CM

## 2018-02-19 DIAGNOSIS — M170 Bilateral primary osteoarthritis of knee: Secondary | ICD-10-CM | POA: Diagnosis present

## 2018-02-19 DIAGNOSIS — M810 Age-related osteoporosis without current pathological fracture: Secondary | ICD-10-CM | POA: Diagnosis present

## 2018-02-19 DIAGNOSIS — E7800 Pure hypercholesterolemia, unspecified: Secondary | ICD-10-CM | POA: Diagnosis present

## 2018-02-19 DIAGNOSIS — Z96652 Presence of left artificial knee joint: Secondary | ICD-10-CM | POA: Diagnosis not present

## 2018-02-19 DIAGNOSIS — Z7982 Long term (current) use of aspirin: Secondary | ICD-10-CM

## 2018-02-19 DIAGNOSIS — Z90710 Acquired absence of both cervix and uterus: Secondary | ICD-10-CM | POA: Diagnosis not present

## 2018-02-19 DIAGNOSIS — Z87891 Personal history of nicotine dependence: Secondary | ICD-10-CM | POA: Diagnosis not present

## 2018-02-19 DIAGNOSIS — Z09 Encounter for follow-up examination after completed treatment for conditions other than malignant neoplasm: Secondary | ICD-10-CM

## 2018-02-19 DIAGNOSIS — K219 Gastro-esophageal reflux disease without esophagitis: Secondary | ICD-10-CM | POA: Diagnosis present

## 2018-02-19 DIAGNOSIS — M1712 Unilateral primary osteoarthritis, left knee: Secondary | ICD-10-CM | POA: Diagnosis not present

## 2018-02-19 DIAGNOSIS — Z471 Aftercare following joint replacement surgery: Secondary | ICD-10-CM | POA: Diagnosis not present

## 2018-02-19 DIAGNOSIS — I10 Essential (primary) hypertension: Secondary | ICD-10-CM | POA: Diagnosis present

## 2018-02-19 HISTORY — PX: TOTAL KNEE ARTHROPLASTY: SHX125

## 2018-02-19 LAB — ABO/RH: ABO/RH(D): A POS

## 2018-02-19 SURGERY — ARTHROPLASTY, KNEE, TOTAL
Anesthesia: Spinal | Site: Knee | Laterality: Left | Wound class: Clean

## 2018-02-19 MED ORDER — PROPOFOL 10 MG/ML IV BOLUS
INTRAVENOUS | Status: DC | PRN
  Administered 2018-02-19: 30 mg via INTRAVENOUS

## 2018-02-19 MED ORDER — ONDANSETRON HCL 4 MG/2ML IJ SOLN: 4.0000 mg | Freq: Four times a day (QID) | INTRAMUSCULAR | Status: DC | PRN

## 2018-02-19 MED ORDER — LIDOCAINE HCL (PF) 2 % IJ SOLN
INTRAMUSCULAR | Status: AC
  Filled 2018-02-19: qty 10

## 2018-02-19 MED ORDER — LACTATED RINGERS IV SOLN
INTRAVENOUS | Status: DC
  Administered 2018-02-19 – 2018-02-20 (×2): via INTRAVENOUS

## 2018-02-19 MED ORDER — TRAMADOL HCL 50 MG PO TABS
50.0000 mg | ORAL_TABLET | Freq: Four times a day (QID) | ORAL | Status: DC
  Administered 2018-02-19 – 2018-02-21 (×8): 50 mg via ORAL
  Filled 2018-02-19 (×8): qty 1

## 2018-02-19 MED ORDER — MAGNESIUM CITRATE PO SOLN
1.0000 | Freq: Once | ORAL | Status: AC | PRN
  Administered 2018-02-20: 1 via ORAL
  Filled 2018-02-19 (×2): qty 296

## 2018-02-19 MED ORDER — METOCLOPRAMIDE HCL 5 MG/ML IJ SOLN: 5.0000 mg | Freq: Three times a day (TID) | INTRAMUSCULAR | Status: DC | PRN

## 2018-02-19 MED ORDER — SODIUM CHLORIDE 0.9 % IV SOLN
INTRAVENOUS | Status: DC | PRN
  Administered 2018-02-19: 40 mL

## 2018-02-19 MED ORDER — HYDROCODONE-ACETAMINOPHEN 7.5-325 MG PO TABS: 1.0000 | ORAL_TABLET | ORAL | Status: DC | PRN

## 2018-02-19 MED ORDER — ACETAMINOPHEN 500 MG PO TABS
ORAL_TABLET | ORAL | Status: AC
  Filled 2018-02-19: qty 2

## 2018-02-19 MED ORDER — FENTANYL CITRATE (PF) 100 MCG/2ML IJ SOLN: 25.0000 ug | INTRAMUSCULAR | Status: DC | PRN

## 2018-02-19 MED ORDER — GABAPENTIN 300 MG PO CAPS
300.0000 mg | ORAL_CAPSULE | Freq: Three times a day (TID) | ORAL | Status: DC
  Administered 2018-02-19 – 2018-02-21 (×6): 300 mg via ORAL
  Filled 2018-02-19 (×6): qty 1

## 2018-02-19 MED ORDER — ACETAMINOPHEN 500 MG PO TABS
1000.0000 mg | ORAL_TABLET | Freq: Once | ORAL | Status: AC
  Administered 2018-02-19: 1000 mg via ORAL

## 2018-02-19 MED ORDER — BUPIVACAINE-EPINEPHRINE (PF) 0.5% -1:200000 IJ SOLN
INTRAMUSCULAR | Status: DC | PRN
  Administered 2018-02-19: 30 mL

## 2018-02-19 MED ORDER — BUPIVACAINE LIPOSOME 1.3 % IJ SUSP
INTRAMUSCULAR | Status: AC
  Filled 2018-02-19: qty 20

## 2018-02-19 MED ORDER — ONDANSETRON HCL 4 MG PO TABS: 4.0000 mg | ORAL_TABLET | Freq: Four times a day (QID) | ORAL | Status: DC | PRN

## 2018-02-19 MED ORDER — ONDANSETRON HCL 4 MG/2ML IJ SOLN
INTRAMUSCULAR | Status: AC
  Filled 2018-02-19: qty 2

## 2018-02-19 MED ORDER — PROPOFOL 500 MG/50ML IV EMUL
INTRAVENOUS | Status: DC | PRN
  Administered 2018-02-19: 50 ug/kg/min via INTRAVENOUS

## 2018-02-19 MED ORDER — PROPOFOL 500 MG/50ML IV EMUL
INTRAVENOUS | Status: AC
  Filled 2018-02-19: qty 50

## 2018-02-19 MED ORDER — FENTANYL CITRATE (PF) 100 MCG/2ML IJ SOLN
INTRAMUSCULAR | Status: AC
  Administered 2018-02-19: 50 ug via INTRAVENOUS
  Filled 2018-02-19: qty 2

## 2018-02-19 MED ORDER — BACITRACIN 50000 UNITS IM SOLR
INTRAMUSCULAR | Status: AC
  Filled 2018-02-19: qty 2

## 2018-02-19 MED ORDER — PHENOL 1.4 % MT LIQD
1.0000 | OROMUCOSAL | Status: DC | PRN
  Filled 2018-02-19: qty 177

## 2018-02-19 MED ORDER — ACETAMINOPHEN 325 MG PO TABS: 325.0000 mg | ORAL_TABLET | Freq: Four times a day (QID) | ORAL | Status: DC | PRN

## 2018-02-19 MED ORDER — CEFAZOLIN SODIUM-DEXTROSE 2-4 GM/100ML-% IV SOLN
2.0000 g | Freq: Four times a day (QID) | INTRAVENOUS | Status: AC
  Administered 2018-02-19 (×2): 2 g via INTRAVENOUS
  Filled 2018-02-19 (×2): qty 100

## 2018-02-19 MED ORDER — FENTANYL CITRATE (PF) 100 MCG/2ML IJ SOLN
INTRAMUSCULAR | Status: AC
  Filled 2018-02-19: qty 2

## 2018-02-19 MED ORDER — CALCIUM CARBONATE-VITAMIN D 500-200 MG-UNIT PO TABS
1.0000 | ORAL_TABLET | Freq: Two times a day (BID) | ORAL | Status: DC
  Administered 2018-02-19 – 2018-02-21 (×4): 1 via ORAL
  Filled 2018-02-19 (×4): qty 1

## 2018-02-19 MED ORDER — PANTOPRAZOLE SODIUM 40 MG PO TBEC
40.0000 mg | DELAYED_RELEASE_TABLET | Freq: Every day | ORAL | Status: DC
  Administered 2018-02-20 – 2018-02-21 (×2): 40 mg via ORAL
  Filled 2018-02-19 (×2): qty 1

## 2018-02-19 MED ORDER — SODIUM CHLORIDE 0.9 % IV SOLN
INTRAVENOUS | Status: DC | PRN
  Administered 2018-02-19: 50 ug/min via INTRAVENOUS

## 2018-02-19 MED ORDER — LACTATED RINGERS IV SOLN: INTRAVENOUS | Status: DC

## 2018-02-19 MED ORDER — MENTHOL 3 MG MT LOZG
1.0000 | LOZENGE | OROMUCOSAL | Status: DC | PRN
  Filled 2018-02-19: qty 9

## 2018-02-19 MED ORDER — FENTANYL CITRATE (PF) 100 MCG/2ML IJ SOLN
50.0000 ug | Freq: Once | INTRAMUSCULAR | Status: AC
  Administered 2018-02-19: 50 ug via INTRAVENOUS

## 2018-02-19 MED ORDER — EPINEPHRINE PF 1 MG/ML IJ SOLN
INTRAMUSCULAR | Status: DC | PRN
  Administered 2018-02-19: .0001 mg via INTRATHECAL

## 2018-02-19 MED ORDER — EPHEDRINE SULFATE 50 MG/ML IJ SOLN
INTRAMUSCULAR | Status: DC | PRN
  Administered 2018-02-19: 15 mg via INTRAVENOUS

## 2018-02-19 MED ORDER — MIDAZOLAM HCL 2 MG/2ML IJ SOLN: 1.0000 mg | Freq: Once | INTRAMUSCULAR | Status: DC

## 2018-02-19 MED ORDER — SODIUM CHLORIDE FLUSH 0.9 % IV SOLN
INTRAVENOUS | Status: AC
  Filled 2018-02-19: qty 60

## 2018-02-19 MED ORDER — HYDROCHLOROTHIAZIDE 12.5 MG PO CAPS
12.5000 mg | ORAL_CAPSULE | Freq: Every day | ORAL | Status: DC
  Administered 2018-02-20 – 2018-02-21 (×2): 12.5 mg via ORAL
  Filled 2018-02-19 (×2): qty 1

## 2018-02-19 MED ORDER — ROPIVACAINE HCL 5 MG/ML IJ SOLN
INTRAMUSCULAR | Status: AC
  Filled 2018-02-19: qty 30

## 2018-02-19 MED ORDER — FENTANYL CITRATE (PF) 100 MCG/2ML IJ SOLN
INTRAMUSCULAR | Status: DC | PRN
  Administered 2018-02-19: 25 ug via INTRAVENOUS

## 2018-02-19 MED ORDER — LIDOCAINE HCL (CARDIAC) PF 100 MG/5ML IV SOSY
PREFILLED_SYRINGE | INTRAVENOUS | Status: DC | PRN
  Administered 2018-02-19: 50 mg via INTRAVENOUS

## 2018-02-19 MED ORDER — LIDOCAINE HCL (PF) 1 % IJ SOLN
INTRAMUSCULAR | Status: AC
  Filled 2018-02-19: qty 5

## 2018-02-19 MED ORDER — BUPIVACAINE HCL (PF) 0.5 % IJ SOLN
INTRAMUSCULAR | Status: DC | PRN
  Administered 2018-02-19: 2.8 mL

## 2018-02-19 MED ORDER — METOPROLOL TARTRATE 50 MG PO TABS
100.0000 mg | ORAL_TABLET | Freq: Two times a day (BID) | ORAL | Status: DC
  Administered 2018-02-20 – 2018-02-21 (×3): 100 mg via ORAL
  Filled 2018-02-19 (×4): qty 2

## 2018-02-19 MED ORDER — MIDAZOLAM HCL 2 MG/2ML IJ SOLN
INTRAMUSCULAR | Status: AC
  Filled 2018-02-19: qty 2

## 2018-02-19 MED ORDER — ONDANSETRON HCL 4 MG/2ML IJ SOLN: 4.0000 mg | Freq: Once | INTRAMUSCULAR | Status: DC | PRN

## 2018-02-19 MED ORDER — HYDROCODONE-ACETAMINOPHEN 5-325 MG PO TABS
1.0000 | ORAL_TABLET | ORAL | Status: DC | PRN
  Administered 2018-02-20 (×2): 1 via ORAL
  Administered 2018-02-20 – 2018-02-21 (×3): 2 via ORAL
  Filled 2018-02-19: qty 2
  Filled 2018-02-19 (×3): qty 1
  Filled 2018-02-19 (×2): qty 2

## 2018-02-19 MED ORDER — BISACODYL 10 MG RE SUPP: 10.0000 mg | Freq: Every day | RECTAL | Status: DC | PRN

## 2018-02-19 MED ORDER — FLUTICASONE PROPIONATE 50 MCG/ACT NA SUSP
1.0000 | Freq: Every day | NASAL | Status: DC | PRN
  Filled 2018-02-19: qty 16

## 2018-02-19 MED ORDER — GABAPENTIN 300 MG PO CAPS
ORAL_CAPSULE | ORAL | Status: AC
  Filled 2018-02-19: qty 1

## 2018-02-19 MED ORDER — KETOROLAC TROMETHAMINE 15 MG/ML IJ SOLN
7.5000 mg | Freq: Four times a day (QID) | INTRAMUSCULAR | Status: AC
  Administered 2018-02-19 – 2018-02-20 (×4): 7.5 mg via INTRAVENOUS
  Filled 2018-02-19 (×4): qty 1

## 2018-02-19 MED ORDER — DOCUSATE SODIUM 100 MG PO CAPS
100.0000 mg | ORAL_CAPSULE | Freq: Two times a day (BID) | ORAL | Status: DC
  Administered 2018-02-19 – 2018-02-20 (×2): 100 mg via ORAL
  Filled 2018-02-19 (×4): qty 1

## 2018-02-19 MED ORDER — GABAPENTIN 300 MG PO CAPS
300.0000 mg | ORAL_CAPSULE | Freq: Once | ORAL | Status: AC
  Administered 2018-02-19: 300 mg via ORAL

## 2018-02-19 MED ORDER — DULOXETINE HCL 30 MG PO CPEP
30.0000 mg | ORAL_CAPSULE | Freq: Every day | ORAL | Status: DC
  Administered 2018-02-20: 60 mg via ORAL
  Administered 2018-02-21: 30 mg via ORAL
  Filled 2018-02-19 (×3): qty 2

## 2018-02-19 MED ORDER — METOCLOPRAMIDE HCL 10 MG PO TABS: 5.0000 mg | ORAL_TABLET | Freq: Three times a day (TID) | ORAL | Status: DC | PRN

## 2018-02-19 MED ORDER — BUPIVACAINE-EPINEPHRINE (PF) 0.5% -1:200000 IJ SOLN
INTRAMUSCULAR | Status: AC
  Filled 2018-02-19: qty 30

## 2018-02-19 MED ORDER — ASPIRIN 81 MG PO CHEW
81.0000 mg | CHEWABLE_TABLET | Freq: Two times a day (BID) | ORAL | Status: DC
  Administered 2018-02-19 – 2018-02-21 (×4): 81 mg via ORAL
  Filled 2018-02-19 (×4): qty 1

## 2018-02-19 MED ORDER — CALCIUM CARB-CHOLECALCIFEROL 600-200 MG-UNIT PO TABS: ORAL_TABLET | Freq: Two times a day (BID) | ORAL | Status: DC

## 2018-02-19 MED ORDER — CHLORHEXIDINE GLUCONATE 4 % EX LIQD: 60.0000 mL | Freq: Once | CUTANEOUS | Status: DC

## 2018-02-19 MED ORDER — BUPIVACAINE LIPOSOME 1.3 % IJ SUSP
20.0000 mL | Freq: Once | INTRAMUSCULAR | Status: DC
  Filled 2018-02-19: qty 20

## 2018-02-19 MED ORDER — LACTATED RINGERS IV SOLN
INTRAVENOUS | Status: DC
  Administered 2018-02-19: 09:00:00 via INTRAVENOUS

## 2018-02-19 MED ORDER — MIDAZOLAM HCL 5 MG/5ML IJ SOLN
INTRAMUSCULAR | Status: DC | PRN
  Administered 2018-02-19: 1 mg via INTRAVENOUS

## 2018-02-19 MED ORDER — MORPHINE SULFATE (PF) 2 MG/ML IV SOLN: 0.5000 mg | INTRAVENOUS | Status: DC | PRN

## 2018-02-19 MED ORDER — OLMESARTAN MEDOXOMIL-HCTZ 20-12.5 MG PO TABS: 1.0000 | ORAL_TABLET | Freq: Every day | ORAL | Status: DC

## 2018-02-19 MED ORDER — IRBESARTAN 150 MG PO TABS
150.0000 mg | ORAL_TABLET | Freq: Every day | ORAL | Status: DC
  Administered 2018-02-20 – 2018-02-21 (×2): 150 mg via ORAL
  Filled 2018-02-19 (×2): qty 1

## 2018-02-19 MED ORDER — SODIUM CHLORIDE 0.9 % IR SOLN
Status: DC | PRN
  Administered 2018-02-19: 250 mL

## 2018-02-19 MED ORDER — ROSUVASTATIN CALCIUM 10 MG PO TABS
40.0000 mg | ORAL_TABLET | Freq: Every day | ORAL | Status: DC
  Administered 2018-02-20 – 2018-02-21 (×2): 40 mg via ORAL
  Filled 2018-02-19 (×2): qty 4

## 2018-02-19 MED ORDER — ACETAMINOPHEN 500 MG PO TABS
500.0000 mg | ORAL_TABLET | Freq: Four times a day (QID) | ORAL | Status: AC
  Administered 2018-02-19 – 2018-02-20 (×4): 500 mg via ORAL
  Filled 2018-02-19 (×4): qty 1

## 2018-02-19 SURGICAL SUPPLY — 61 items
BASEPLATE TIBIAL LT SZ3 (Knees) ×3 IMPLANT
BLADE SAW 1 (BLADE) ×3 IMPLANT
BLADE SAW 1/2 (BLADE) IMPLANT
BLADE SAW SAG 25X90X1.19 (BLADE) ×3 IMPLANT
BOWL CEMENT MIX W/ADAPTER (MISCELLANEOUS) ×3 IMPLANT
BRUSH SCRUB EZ  4% CHG (MISCELLANEOUS) ×4
BRUSH SCRUB EZ 4% CHG (MISCELLANEOUS) ×2 IMPLANT
CANISTER SUCT 1200ML W/VALVE (MISCELLANEOUS) ×3 IMPLANT
CANISTER SUCT 3000ML PPV (MISCELLANEOUS) ×6 IMPLANT
CEMENT BONE 1-PACK (Cement) ×6 IMPLANT
CHLORAPREP W/TINT 26ML (MISCELLANEOUS) ×6 IMPLANT
COMP FEMORAL OXINIUM SZ 3 LEFT (Orthopedic Implant) ×3 IMPLANT
COMP PATELLA GENESIS 29 OVAL (Orthopedic Implant) ×3 IMPLANT
COMPONENT FEMRL OXINM SZ3 LT (Orthopedic Implant) ×1 IMPLANT
COMPONENT PTLLA GENS 29 OVAL (Orthopedic Implant) ×1 IMPLANT
COOLER POLAR GLACIER W/PUMP (MISCELLANEOUS) ×3 IMPLANT
CUFF TOURN 30 STER DUAL PORT (MISCELLANEOUS) ×3 IMPLANT
DRAPE INCISE IOBAN 66X60 STRL (DRAPES) ×3 IMPLANT
DRAPE SHEET LG 3/4 BI-LAMINATE (DRAPES) ×3 IMPLANT
DRSG AQUACEL AG ADV 3.5X14 (GAUZE/BANDAGES/DRESSINGS) ×3 IMPLANT
DRSG TEGADERM 8X12 (GAUZE/BANDAGES/DRESSINGS) ×3 IMPLANT
ELECT REM PT RETURN 9FT ADLT (ELECTROSURGICAL) ×3
ELECTRODE REM PT RTRN 9FT ADLT (ELECTROSURGICAL) ×1 IMPLANT
GAUZE PETRO XEROFOAM 1X8 (MISCELLANEOUS) IMPLANT
GLOVE INDICATOR 8.0 STRL GRN (GLOVE) ×3 IMPLANT
GLOVE SURG ORTHO 8.0 STRL STRW (GLOVE) ×9 IMPLANT
GOWN STRL REUS W/ TWL LRG LVL3 (GOWN DISPOSABLE) ×1 IMPLANT
GOWN STRL REUS W/ TWL XL LVL3 (GOWN DISPOSABLE) ×1 IMPLANT
GOWN STRL REUS W/TWL LRG LVL3 (GOWN DISPOSABLE) ×2
GOWN STRL REUS W/TWL XL LVL3 (GOWN DISPOSABLE) ×2
Guide Kit ×2 IMPLANT
HOOD PEEL AWAY FLYTE STAYCOOL (MISCELLANEOUS) ×9 IMPLANT
INSERT ARTI HI FLEX 9 SZ 3-4 (Insert) ×3 IMPLANT
IV NS 1000ML (IV SOLUTION) ×2
IV NS 1000ML BAXH (IV SOLUTION) ×1 IMPLANT
KIT PATIENT ADPT GUIDE F3/T3 (DISPOSABLE) ×3 IMPLANT
KIT TURNOVER KIT A (KITS) ×3 IMPLANT
MAT ABSORB  FLUID 56X50 GRAY (MISCELLANEOUS) ×2
MAT ABSORB FLUID 56X50 GRAY (MISCELLANEOUS) ×1 IMPLANT
NDL SAFETY ECLIPSE 18X1.5 (NEEDLE) ×1 IMPLANT
NEEDLE HYPO 18GX1.5 SHARP (NEEDLE) ×2
NEEDLE SPNL 20GX3.5 QUINCKE YW (NEEDLE) ×3 IMPLANT
NS IRRIG 1000ML POUR BTL (IV SOLUTION) ×3 IMPLANT
PACK TOTAL KNEE (MISCELLANEOUS) ×3 IMPLANT
PAD DE MAYO PRESSURE PROTECT (MISCELLANEOUS) ×6 IMPLANT
PAD WRAPON POLAR KNEE (MISCELLANEOUS) ×1 IMPLANT
PIN TROCAR 3 INCH (PIN) ×3 IMPLANT
PULSAVAC PLUS IRRIG FAN TIP (DISPOSABLE) ×3
STAPLER SKIN PROX 35W (STAPLE) ×3 IMPLANT
SUCTION FRAZIER HANDLE 10FR (MISCELLANEOUS) ×2
SUCTION TUBE FRAZIER 10FR DISP (MISCELLANEOUS) ×1 IMPLANT
SUT DVC 2 QUILL PDO  T11 36X36 (SUTURE) ×2
SUT DVC 2 QUILL PDO T11 36X36 (SUTURE) ×1 IMPLANT
SUT VIC AB 2-0 CT1 18 (SUTURE) ×3 IMPLANT
SUT VIC AB 2-0 CT1 27 (SUTURE) ×2
SUT VIC AB 2-0 CT1 TAPERPNT 27 (SUTURE) ×1 IMPLANT
SUT VIC AB PLUS 45CM 1-MO-4 (SUTURE) ×3 IMPLANT
SYR 30ML LL (SYRINGE) ×9 IMPLANT
TIP FAN IRRIG PULSAVAC PLUS (DISPOSABLE) ×1 IMPLANT
Trocar Pin ×2 IMPLANT
WRAPON POLAR PAD KNEE (MISCELLANEOUS) ×3

## 2018-02-19 NOTE — Progress Notes (Signed)
Patient was admitted to room 148 from OR. A&O x4. Husband at bedside. Polar care and TED and foot pump on. Post-op dressing to right knee is CD&I. Bed alarm on for safety.

## 2018-02-19 NOTE — OR Nursing (Signed)
Call from PACU [phone] - ok to bring pt for block

## 2018-02-19 NOTE — Anesthesia Procedure Notes (Signed)
Date/Time: 02/19/2018 10:26 AM Performed by: Johnna Acosta, CRNA Pre-anesthesia Checklist: Patient identified, Emergency Drugs available, Suction available, Patient being monitored and Timeout performed Patient Re-evaluated:Patient Re-evaluated prior to induction Oxygen Delivery Method: Simple face mask Preoxygenation: Pre-oxygenation with 100% oxygen

## 2018-02-19 NOTE — Anesthesia Procedure Notes (Signed)
Anesthesia Regional Block: Adductor canal block   Pre-Anesthetic Checklist: ,, timeout performed, Correct Patient, Correct Site, Correct Laterality, Correct Procedure, Correct Position, site marked, Risks and benefits discussed,  Surgical consent,  Pre-op evaluation,  At surgeon's request and post-op pain management  Laterality: Left  Prep: Maximum Sterile Barrier Precautions used, chloraprep, alcohol swabs       Needles:   Needle Type: Stimiplex      Needle Gauge: 21     Additional Needles:   Procedures:, nerve stimulator,,, ultrasound used (permanent image in chart),,,,  Narrative:  Start time: 02/19/2018 9:55 AM End time: 02/19/2018 10:00 AM  Additional Notes: Time out called.  Patient placed in semireclining position.  A roll was placed under her left hip.  The area was prepped and draped in sterile fashion .  The US probe was placed about a hand width below the inguinal ligament and the artery was IDed.  A skin wheal was placed just lateral to the probe with 1% Lidocaine plain.  The stimuplex needle was advanced to the 10 oclock position, just lateral to the artery.  Incremental injection of 30 cc of 0.5% Ropivacaine plain was injected after negative aspirations.  The patient tolerated the procedure well.

## 2018-02-19 NOTE — Anesthesia Preprocedure Evaluation (Signed)
Anesthesia Evaluation  Patient identified by MRN, date of birth, ID band Patient awake    Reviewed: Allergy & Precautions, NPO status , Patient's Chart, lab work & pertinent test results, reviewed documented beta blocker date and time   Airway Mallampati: II  TM Distance: >3 FB     Dental   Pulmonary former smoker,    Pulmonary exam normal        Cardiovascular hypertension, Pt. on medications and Pt. on home beta blockers Normal cardiovascular exam     Neuro/Psych Anxiety negative neurological ROS     GI/Hepatic GERD  Medicated,  Endo/Other  diabetes  Renal/GU Renal InsufficiencyRenal disease  negative genitourinary   Musculoskeletal  (+) Arthritis , Osteoarthritis,    Abdominal Normal abdominal exam  (+)   Peds negative pediatric ROS (+)  Hematology negative hematology ROS (+)   Anesthesia Other Findings   Reproductive/Obstetrics                             Anesthesia Physical Anesthesia Plan  ASA: II  Anesthesia Plan: Spinal   Post-op Pain Management:  Regional for Post-op pain   Induction: Intravenous  PONV Risk Score and Plan:   Airway Management Planned: Nasal Cannula  Additional Equipment:   Intra-op Plan:   Post-operative Plan:   Informed Consent: I have reviewed the patients History and Physical, chart, labs and discussed the procedure including the risks, benefits and alternatives for the proposed anesthesia with the patient or authorized representative who has indicated his/her understanding and acceptance.   Dental advisory given  Plan Discussed with: CRNA and Surgeon  Anesthesia Plan Comments:         Anesthesia Quick Evaluation

## 2018-02-19 NOTE — Transfer of Care (Signed)
Immediate Anesthesia Transfer of Care Note  Patient: Hayley Simmons  Procedure(s) Performed: TOTAL KNEE ARTHROPLASTY (Left Knee)  Patient Location: PACU  Anesthesia Type:Spinal  Level of Consciousness: awake and alert   Airway & Oxygen Therapy: Patient Spontanous Breathing and Patient connected to face mask oxygen  Post-op Assessment: Report given to RN and Post -op Vital signs reviewed and stable  Post vital signs: Reviewed and stable  Last Vitals:  Vitals Value Taken Time  BP 88/54 02/19/2018 12:38 PM  Temp 36.7 C 02/19/2018 12:38 PM  Pulse 60 02/19/2018 12:38 PM  Resp 10 02/19/2018 12:38 PM  SpO2 100 % 02/19/2018 12:38 PM    Last Pain:  Vitals:   02/19/18 1238  TempSrc: Temporal  PainSc:          Complications: No apparent anesthesia complications

## 2018-02-19 NOTE — Anesthesia Post-op Follow-up Note (Signed)
Anesthesia QCDR form completed.        

## 2018-02-19 NOTE — OR Nursing (Signed)
Lab tech in for abo/rh draw @ [phone]

## 2018-02-19 NOTE — Op Note (Signed)
DATE OF SURGERY:  02/19/2018 TIME: 12:33 PM  PATIENT NAME:  Eleana L Bohman   AGE: 72 y.o.    PRE-OPERATIVE DIAGNOSIS:  M17.12 Unilateral primary osteoarthritis, left knee  POST-OPERATIVE DIAGNOSIS:  Same  PROCEDURE:  Procedure(s): TOTAL KNEE ARTHROPLASTY, LEFT  SURGEON:  Lovell Sheehan, MD   ASSISTANT:  Carlynn Spry, PA-C  OPERATIVE IMPLANTS: Tamala Julian and Nephew Femur size 3, Tibia size 3, 29 mm patella and a 9 mm tibial insert  PREOPERATIVE INDICATIONS:  NOLIE BIGNELL is an 73 y.o. female who has a diagnosis of left knee osteoarthritis and elected for a left total knee arthroplasty after failing nonoperative treatment, including activity modification, pain medication, physical therapy and injections who has significant impairment of their activities of daily living.  Radiographs have demonstrated tricompartmental osteoarthritis joint space narrowing, osteophytes, subchondral sclerosis and cyst formation.  The risks, benefits, and alternatives were discussed at length including but not limited to the risks of infection, bleeding, nerve or blood vessel injury, knee stiffness, fracture, dislocation, loosening or failure of the hardware and the need for further surgery. Medical risks include but not limited to DVT and pulmonary embolism, myocardial infarction, stroke, pneumonia, respiratory failure and death. I discussed these risks with the patient in my office prior to the date of surgery. They understood these risks and were willing to proceed.  OPERATIVE FINDINGS AND UNIQUE ASPECTS OF THE CASE:  All three compartments with advanced and severe degenerative changes, large osteophytes and an abundance of synovial fluid. Significant deformity was also noted. A decision was made to proceed with total knee arthroplasty.   OPERATIVE DESCRIPTION:  The patient was brought to the operative room and placed in a supine position after undergoing placement of a general anesthetic. IV antibiotics  were given. Patient received tranexamic acid. The lower extremity was prepped and draped in the usual sterile fashion.  A time out was performed to verify the patient's name, date of birth, medical record number, correct site of surgery and correct procedure to be performed. The timeout was also used to confirm the patient received antibiotics and that appropriate instruments, implants and radiographs studies were available in the room.  The leg was elevated and exsanguinated with an Esmarch and the tourniquet was inflated to 265 mmHg.  A midline incision was made over the left knee.. A medial parapatellar arthrotomy was then made and the patella subluxed laterally and the knee was brought into 90 of flexion. Hoffa's fat pad along with the anterior cruciate ligament was resected and the medial joint line was exposed.  Attention was then turned to preparation of the patella. The thickness of the patella was measured with a caliper, the diameter measured with the patella templates.  The patella resection was then made with an oscillating saw using the patella cutting guide.  The 29 mm button fit appropriately.  3 peg holes for the patella component were then drilled.  The extramedullary tibial cutting guide was then placed using the anterior tibial crest and second ray of the foot as a reference.  The tibial cutting guide was adjusted to allow for appropriate posterior slope.  The tibial cutting block was pinned into position. The slotted stylus was used to measure the proximal tibial resection of 2 mm off the low medial side. Care was taken during the tibial resection to protect the medial and collateral ligaments.  The resected tibial bone was removed.  The distal femur was resected using the TruMatch cutting guide.  Care was taken to  protect the collateral ligaments during distal femoral resection.  The distal femoral resection was performed with an oscillating saw. The femoral cutting guide was then  removed. Extension gap was measured with a 9 mm spacer block and alignment and extension was confirmed using a long alignment rod. The femur was sized to be a 3. Rotation of the referencing guide was checked with the epicondylar axis and Whitesides line. Then the 4-in-1 cutting jig was then applied to the distal femur. A stylus was used to confirm that the anterior femur would not be notched.   Then the anterior, posterior and chamfer femoral cuts were then made with an oscillating saw.  All posterior osteophytes were removed.  The flexion gap was then measured with a flexion spacer block and long alignment rod and was found to be symmetric with the extension gap and perpendicular to mechanical axis of the tibia.  The proximal tibia plateau was then sized with trial trays. The best coverage was achieved with a size 3. This tibial tray was then pinned into position. The proximal tibia was then prepared with the reamer and keel punch.  After tibial preparation was completed, all trial components were inserted with polyethylene trials.  The knee was found to have excellent balance and full motion with a size 9 mm tibial polyethylene insert..    The trials were then placed. Knee was taken through a full range of motion and deemed to be stable with the trial components. All trial components were then removed.  The joint was copiously irrigated with pulse lavage.  The final total knee arthroplasty components were then cemented into place. The knee was held in extension while cement was allowed to cure.The knee was taken through a range of motion and the patella tracked well and the knee was again irrigated copiously.  The knee capsule was then injected with Exparel.  The medial arthrotomy was closed with #1 Vicryl and #2 Quill. The subcutaneous tissue closed with  2-0 vicryl, and skin approximated with staples.  A dry sterile and compressive dressing was applied.  A Polar Care was applied to the operative  knee.  The patient was awakened and brought to the PACU in stable and satisfactory condition.  All sharp, lap and instrument counts were correct at the conclusion the case. I spoke with the patient's family in the postop consultation room to let them know the case had been performed without complication and the patient was stable in recovery room.   Total tourniquet time was 47 minutes.

## 2018-02-19 NOTE — Anesthesia Procedure Notes (Signed)
Spinal  Patient location during procedure: OR Start time: 02/19/2018 10:18 AM End time: 02/19/2018 10:24 AM Staffing Anesthesiologist: Alvin Critchley, MD Resident/CRNA: Johnna Acosta, CRNA Performed: resident/CRNA  Preanesthetic Checklist Completed: patient identified, site marked, surgical consent, pre-op evaluation, timeout performed, IV checked, risks and benefits discussed and monitors and equipment checked Spinal Block Patient position: sitting Prep: ChloraPrep Patient monitoring: heart rate, continuous pulse ox, blood pressure and cardiac monitor Approach: midline Location: L3-4 Injection technique: single-shot Needle Needle type: Whitacre and Introducer  Needle gauge: 24 G Needle length: 9 cm Assessment Sensory level: T10 Additional Notes Negative paresthesia. Negative blood return. Positive free-flowing CSF. Expiration date of kit checked and confirmed. Patient tolerated procedure well, without complications.

## 2018-02-19 NOTE — Progress Notes (Signed)
Pt spinal progressing down legs. Dr. Kayleen Memos notified. Pt OK to go to her room now. Elridge Stemm E 02/19/2018 1:45 PM

## 2018-02-19 NOTE — NC FL2 (Signed)
[place] LEVEL OF CARE SCREENING TOOL     IDENTIFICATION  Patient Name: Hayley Simmons Birthdate: [birth year] Sex: female Admission Date (Current Location): 02/19/2018  [place] and [place] Number:  Engineering geologist and Address:  [facility], [address], [place], [place] [address]      Provider Number: [id number]  Attending Physician Name and Address:  Lovell Sheehan, MD  Relative Name and Phone Number:       Current Level of Care: Hospital Recommended Level of Care: [place] Prior Approval Number:    Date Approved/Denied:   PASRR Number: ([id number])  Discharge Plan: SNF    Current Diagnoses: Patient Active Problem List   Diagnosis Date Noted  . Total knee replacement status, left 02/19/2018  . Chronic kidney disease, stage III (moderate) ([place]) 01/23/2018  . MI (mitral incompetence) 01/18/2018  . Diabetes mellitus type 2 in obese ([place]) 06/21/2016  . Elevated TSH 05/19/2016  . Osteoarthritis of both knees 06/24/2015  . Female stress incontinence 06/24/2015  . Hypertension 05/19/2015  . GERD (gastroesophageal reflux disease) 05/19/2015  . Hyperlipidemia 05/19/2015  . At risk for falling 05/19/2015  . Obesity, morbid ([place]) 05/19/2015    Orientation RESPIRATION BLADDER Height & Weight     Self, Time, Situation, Place  Normal Continent Weight: 206 lb 1.6 oz (93.5 kg) Height:  5\' 2"  (157.5 cm)  BEHAVIORAL SYMPTOMS/MOOD NEUROLOGICAL BOWEL NUTRITION STATUS      Continent Diet(Diet: Clear Liquid to be Advanced. )  AMBULATORY STATUS COMMUNICATION OF NEEDS Skin   Extensive Assist Verbally Surgical wounds(Incision: Left Knee. )                       Personal Care Assistance Level of Assistance  Feeding, Bathing, Dressing Bathing Assistance: Limited assistance Feeding assistance: Independent Dressing Assistance: Limited assistance     Functional Limitations Info  Sight, Hearing, Speech Sight  Info: Adequate Hearing Info: Adequate Speech Info: Adequate    SPECIAL CARE FACTORS FREQUENCY  PT (By licensed PT), OT (By licensed OT)     PT Frequency: (5) OT Frequency: (5)            Contractures      Additional Factors Info  Code Status, Allergies Code Status Info: (Full Code. ) Allergies Info: (Caduet  Amlodipine-atorvastatin, Cyclosporine, Escitalopram)           Current Medications (02/19/2018):  This is the current hospital active medication list Current Facility-Administered Medications  Medication Dose Route Frequency Provider Last Rate Last Dose  . acetaminophen (TYLENOL) 500 MG tablet           . [START ON 02/20/2018] acetaminophen (TYLENOL) tablet 325-650 mg  325-650 mg Oral Q6H PRN Lovell Sheehan, MD      . acetaminophen (TYLENOL) tablet 500 mg  500 mg Oral Q6H Lovell Sheehan, MD   500 mg at 02/19/18 1433  . aspirin chewable tablet 81 mg  81 mg Oral BID Lovell Sheehan, MD      . bisacodyl (DULCOLAX) suppository 10 mg  10 mg Rectal Daily PRN Lovell Sheehan, MD      . calcium-vitamin D (OSCAL WITH D) 500-200 MG-UNIT per tablet 1 tablet  1 tablet Oral BID Lovell Sheehan, MD      . ceFAZolin (ANCEF) IVPB 2g/100 mL premix  2 g Intravenous Q6H Lovell Sheehan, MD 200 mL/hr at 02/19/18 1436 2 g at 02/19/18 1436  . docusate sodium (  COLACE) capsule 100 mg  100 mg Oral BID Lovell Sheehan, MD      . DULoxetine (CYMBALTA) DR capsule 30-60 mg  30-60 mg Oral Daily Lovell Sheehan, MD      . fluticasone [facility]) 50 MCG/ACT nasal spray 1-2 spray  1-2 spray Each Nare Daily PRN Lovell Sheehan, MD      . gabapentin (NEURONTIN) 300 MG capsule           . gabapentin (NEURONTIN) capsule 300 mg  300 mg Oral TID Lovell Sheehan, MD      . irbesartan (AVAPRO) tablet 150 mg  150 mg Oral Daily Lovell Sheehan, MD       And  . hydrochlorothiazide (MICROZIDE) capsule 12.5 mg  12.5 mg Oral Daily Lovell Sheehan, MD      . HYDROcodone-acetaminophen (NORCO) 7.5-325 MG per tablet 1-2  tablet  1-2 tablet Oral Q4H PRN Lovell Sheehan, MD      . HYDROcodone-acetaminophen (NORCO/VICODIN) 5-325 MG per tablet 1-2 tablet  1-2 tablet Oral Q4H PRN Lovell Sheehan, MD      . ketorolac (TORADOL) 15 MG/ML injection 7.5 mg  7.5 mg Intravenous Q6H Lovell Sheehan, MD   7.5 mg at 02/19/18 1434  . lactated ringers infusion   Intravenous Continuous Lovell Sheehan, MD 75 mL/hr at 02/19/18 1438    . lidocaine (PF) (XYLOCAINE) 1 % injection           . magnesium citrate solution 1 Bottle  1 Bottle Oral Once PRN Lovell Sheehan, MD      . menthol-cetylpyridinium (CEPACOL) lozenge 3 mg  1 lozenge Oral PRN Lovell Sheehan, MD       Or  . phenol (CHLORASEPTIC) mouth spray 1 spray  1 spray Mouth/Throat PRN Lovell Sheehan, MD      . metoCLOPramide (REGLAN) tablet 5-10 mg  5-10 mg Oral Q8H PRN Lovell Sheehan, MD       Or  . metoCLOPramide (REGLAN) injection 5-10 mg  5-10 mg Intravenous Q8H PRN Lovell Sheehan, MD      . metoprolol tartrate (LOPRESSOR) tablet 100 mg  100 mg Oral BID Lovell Sheehan, MD      . midazolam (VERSED) 2 MG/2ML injection           . morphine 2 MG/ML injection 0.5-1 mg  0.5-1 mg Intravenous Q2H PRN Lovell Sheehan, MD      . ondansetron [facility]) tablet 4 mg  4 mg Oral Q6H PRN Lovell Sheehan, MD       Or  . ondansetron [facility]) injection 4 mg  4 mg Intravenous Q6H PRN Lovell Sheehan, MD      . pantoprazole (PROTONIX) EC tablet 40 mg  40 mg Oral Daily Lovell Sheehan, MD      . ropivacaine (PF) 5 mg/mL (0.5%) (NAROPIN) 5 MG/ML injection           . [START ON 02/20/2018] rosuvastatin (CRESTOR) tablet 40 mg  40 mg Oral Daily Lovell Sheehan, MD      . traMADol [facility]at[facility]bon) tablet 50 mg  50 mg Oral Q6H Lovell Sheehan, MD         Discharge Medications: Please see discharge summary for a list of discharge medications.  Relevant Imaging Results:  Relevant Lab Results:   Additional Information (SSN: [id number])  Aengus Sauceda, Veronia Beets, LCSW

## 2018-02-19 NOTE — OR Nursing (Signed)
Ancef 2gm and TXA sent to PACU with patient.  Report given to Amy.

## 2018-02-19 NOTE — H&P (Signed)
The patient has been re-examined, and the chart reviewed, and there have been no interval changes to the documented history and physical.  Plan a left total knee replacement today.  Anesthesia is consulted regarding a peripheral nerve block for post-operative pain.  The risks, benefits, and alternatives have been discussed at length, and the patient is willing to proceed.     

## 2018-02-19 NOTE — Progress Notes (Signed)
PT Cancellation Note  Patient Details Name: Hayley Simmons MRN: [id number] DOB: [birth year]   Cancelled Treatment:    Reason Eval/Treat Not Completed: Medical issues which prohibited therapy. Order received and chart reviewed. Attempted to see patient however she has not recovered sensation or full motor function in bilateral LEs. Will hold PT evaluation and perform on later date as appropriate.   Lyndel Safe Adalynd Donahoe PT, DPT   Malillany Kazlauskas 02/19/2018, 3:16 PM

## 2018-02-20 LAB — BASIC METABOLIC PANEL
Anion gap: 10 (ref 5–15)
BUN: 18 mg/dL (ref 6–20)
CALCIUM: 8.2 mg/dL — AB (ref 8.9–10.3)
CO2: 22 mmol/L (ref 22–32)
Chloride: 101 mmol/L (ref 101–111)
Creatinine, Ser: 1.24 mg/dL — ABNORMAL HIGH (ref 0.44–1.00)
GFR, EST AFRICAN AMERICAN: 50 mL/min — AB (ref >60)
GFR, EST NON AFRICAN AMERICAN: 43 mL/min — AB (ref >60)
Glucose, Bld: 123 mg/dL — ABNORMAL HIGH (ref 65–99)
Potassium: 3.5 mmol/L (ref 3.5–5.1)
Sodium: 133 mmol/L — ABNORMAL LOW (ref 135–145)

## 2018-02-20 LAB — CBC
HEMATOCRIT: 33.7 % — AB (ref 35.0–47.0)
Hemoglobin: 11.4 g/dL — ABNORMAL LOW (ref 12.0–16.0)
MCH: 29.1 pg (ref 26.0–34.0)
MCHC: 33.8 g/dL (ref 32.0–36.0)
MCV: 86.3 fL (ref 80.0–100.0)
Platelets: 243 10*3/uL (ref 150–440)
RBC: 3.9 MIL/uL (ref 3.80–5.20)
RDW: 13.9 % (ref 11.5–14.5)
WBC: 9.6 10*3/uL (ref 3.6–11.0)

## 2018-02-20 NOTE — Progress Notes (Signed)
Physical Therapy Treatment Patient Details Name: Hayley Simmons MRN: [id number] DOB: May 28, 1946 Today's Date: 02/20/2018    History of Present Illness Pt underwent L TKR without reported post-op complications. PMH includes anxiety, OA, GERD, and HTN. PT evaluation attempted on POD#0 but was unable to be performed so completed on POD#1.    PT Comments    Pt demonstrates improvement with therapy this afternoon. She was provided freedom to ambulate the distance of her choosing and was able to complete a full lap around RN station with a walker. Cues for upright posture and decreased UE support on walker. Vitals monitored intermittently throughout ambulation distance. SaO2 remains >95% on room air and pt denies DOE. She is fatigued by the end of ambulation distance but reports a decrease in her knee pain after walking. Pt able to complete all seated exercises as instructed. Will complete stairs tomorrow AM in preparation for discharge. Pt will benefit from PT services to address deficits in strength, balance, and mobility in order to return to full function at home.    Follow Up Recommendations  Outpatient PT     Equipment Recommendations  None recommended by PT    Recommendations for Other Services       Precautions / Restrictions Precautions Precautions: Fall;Knee Required Braces or Orthoses: Knee Immobilizer - Left Knee Immobilizer - Left: Discontinue once straight leg raise with < 10 degree lag Restrictions Weight Bearing Restrictions: Yes LLE Weight Bearing: Weight bearing as tolerated    Mobility  Bed Mobility Overal bed mobility: Needs Assistance Bed Mobility: Sit to Supine     Supine to sit: Min assist Sit to supine: Min assist   General bed mobility comments: Pt received upright in recliner. She requires minA+1 to assist LLE when returning to bed. Overhead trapeze and bed rails utilized to scoot up toward [facility]  Transfers Overall transfer level: Needs  assistance Equipment used: Rolling walker (2 wheeled) Transfers: Sit to/from Stand Sit to Stand: Min guard         General transfer comment: Pt continues to require intermittent cues for hand placement with transfers. Reports increase in L knee pain with sit to stand transfers but less when sitting after ambulating  Ambulation/Gait Ambulation/Gait assistance: Min guard Ambulation Distance (Feet): 220 Feet Assistive device: Rolling walker (2 wheeled) Gait Pattern/deviations: Step-to pattern Gait velocity: Below baseline   General Gait Details: Pt provided freedom to ambulate the distance of her choice this afternoon. She was able to complete a full lap around RN station with a walker. Cues for upright posture and decreased UE support on walker. Vitals monitored intermittently throughout ambulation distance. SaO2 remains >95% on room air and pt denies DOE. She is fatigued by the end of ambulation distance   Marine scientist [place] (Stroke Patients Only)       Balance Overall balance assessment: Needs assistance Sitting-balance support: No upper extremity supported Sitting balance-Leahy Scale: Good     Standing balance support: Bilateral upper extremity supported Standing balance-Leahy Scale: Fair Standing balance comment: Able to balance with light UE support on walker                            Cognition Arousal/Alertness: Awake/alert Behavior During Therapy: WFL for tasks assessed/performed Overall Cognitive Status: Within Functional Limits for tasks assessed  Exercises Total Joint Exercises Ankle Circles/Pumps: Both;15 reps;Seated;Other (comment)(Heel raises) Quad Sets: Both;10 reps;Supine Gluteal Sets: Both;10 reps;Supine Towel Squeeze: Both;10 reps;Supine Short Arc Quad: Left;10 reps;Supine Heel Slides: Left;10 reps;Supine Hip ABduction/ADduction: Both;15  reps;Seated Straight Leg Raises: Left;10 reps;Supine Long Arc Quad: Left;15 reps;Seated Knee Flexion: Left;15 reps;Seated Goniometric ROM: -1 to 90 degrees AAROM, pain limited in flexion and extension Marching in Standing: Both;15 reps;Seated    General Comments        Pertinent Vitals/Pain Pain Assessment: 0-10 Pain Score: 10-Worst pain ever Pain Location: L knee Pain Intervention(s): RN gave pain meds during session;Patient requesting pain meds-RN notified    Home Living Family/patient expects to be discharged to:: Private residence Living Arrangements: Spouse/significant other Available Help at Discharge: Family Type of Home: House Home Access: Stairs to enter Entrance Stairs-Rails: Right Home Layout: One level Home Equipment: Environmental consultant - 2 wheels;Cane - single point;Bedside commode;Shower seat      Prior Function Level of Independence: Needs assistance  Gait / Transfers Assistance Needed: Pt initially ambulated with spc but progressed to RW prior to surgery secondary to pain. Denies falls ADL's / Homemaking Assistance Needed: Independent with ADLs but requires assist with IADLs     PT Goals (current goals can now be found in the care plan section) Acute Rehab PT Goals Patient Stated Goal: Improved pain-free function at home PT Goal Formulation: With patient Time For Goal Achievement: 03/06/18 Potential to Achieve Goals: Good Progress towards PT goals: Progressing toward goals    Frequency    BID      PT Plan Current plan remains appropriate    Co-evaluation              AM-PAC PT "6 Clicks" Daily Activity  Outcome Measure  Difficulty turning over in bed (including adjusting bedclothes, sheets and blankets)?: Unable Difficulty moving from lying on back to sitting on the side of the bed? : Unable Difficulty sitting down on and standing up from a chair with arms (e.g., wheelchair, bedside commode, etc,.)?: A Little Help needed moving to and from a bed to  chair (including a wheelchair)?: A Little Help needed walking in hospital room?: A Little Help needed climbing 3-5 steps with a railing? : A Lot 6 Click Score: 13    End of Session Equipment Utilized During Treatment: Gait belt Activity Tolerance: Patient tolerated treatment well Patient left: with call bell/phone within reach;with SCD's reapplied;Other (comment);in bed;with bed alarm set(towel roll under heel, polar care in place) Nurse Communication: Mobility status PT Visit Diagnosis: Unsteadiness on feet (R26.81);Muscle weakness (generalized) (M62.81);Difficulty in walking, not elsewhere classified (R26.2)     Time: 1962-2297 PT Time Calculation (min) (ACUTE ONLY): 27 min  Charges:  $Gait Training: 8-22 mins $Therapeutic Exercise: 8-22 mins                    G Codes:       Lyndel Safe Huprich PT, DPT     Huprich,Jason 02/20/2018, 2:50 PM

## 2018-02-20 NOTE — Anesthesia Postprocedure Evaluation (Signed)
Anesthesia Post Note  Patient: Hayley Simmons  Procedure(s) Performed: TOTAL KNEE ARTHROPLASTY (Left Knee)  Anesthesia Type: Spinal Level of consciousness: awake and alert and oriented Pain management: pain level controlled Vital Signs Assessment: post-procedure vital signs reviewed and stable Respiratory status: spontaneous breathing Cardiovascular status: blood pressure returned to baseline Anesthetic complications: no     Last Vitals:  Vitals:   02/20/18 0748 02/20/18 1206  BP: 131/71 120/62  Pulse: 81 69  Resp:    Temp: 37.1 C 36.5 C  SpO2: 95% 96%    Last Pain:  Vitals:   02/20/18 1206  TempSrc: Oral  PainSc:                  Fin Hupp

## 2018-02-20 NOTE — Progress Notes (Signed)
  Subjective:  Patient reports pain as moderate.    Objective:   VITALS:   Vitals:   02/19/18 1853 02/19/18 1957 02/20/18 0030 02/20/18 0508  BP: (!) 98/52 (!) 102/51 (!) 145/73 117/74  Pulse: (!) 57 (!) 58 79 83  Resp:  16 16 17   Temp: 97.7 F (36.5 C) (!) 97.5 F (36.4 C) 98 F (36.7 C) 98.2 F (36.8 C)  TempSrc: Oral Oral Oral   SpO2: 96% 96% 95% 93%  Weight:      Height:        PHYSICAL EXAM:  Sensation intact distally Dorsiflexion/Plantar flexion intact Incision: dressing C/D/I Compartment soft  LABS  Results for orders placed or performed during the hospital encounter of 02/19/18 (from the past 24 hour(s))  ABO/Rh     Status: None   Collection Time: 02/19/18  8:20 AM  Result Value Ref Range   ABO/RH(D)      A POS Performed at [facility], [address] [place], [place] [address]   CBC     Status: Abnormal   Collection Time: 02/20/18  5:18 AM  Result Value Ref Range   WBC 9.6 3.6 - 11.0 K/uL   RBC 3.90 3.80 - 5.20 MIL/uL   Hemoglobin 11.4 (L) 12.0 - 16.0 g/dL   HCT 33.7 (L) 35.0 - 47.0 %   MCV 86.3 80.0 - 100.0 fL   MCH 29.1 26.0 - 34.0 pg   MCHC 33.8 32.0 - 36.0 g/dL   RDW 13.9 11.5 - 14.5 %   Platelets 243 150 - 440 K/uL  Basic metabolic panel     Status: Abnormal   Collection Time: 02/20/18  5:18 AM  Result Value Ref Range   Sodium 133 (L) 135 - 145 mmol/L   Potassium 3.5 3.5 - 5.1 mmol/L   Chloride 101 101 - 111 mmol/L   CO2 22 22 - 32 mmol/L   Glucose, Bld 123 (H) 65 - 99 mg/dL   BUN 18 6 - 20 mg/dL   Creatinine, Ser 1.24 (H) 0.44 - 1.00 mg/dL   Calcium 8.2 (L) 8.9 - 10.3 mg/dL   GFR calc non Af Amer 43 (L) >60 mL/min   GFR calc Af Amer 50 (L) >60 mL/min   Anion gap 10 5 - 15    Dg Knee Left Port  Result Date: 02/19/2018 CLINICAL DATA:  Left total knee replacement. EXAM: PORTABLE LEFT KNEE - 1-2 VIEW COMPARISON:  06/24/2015 and MR dated 01/20/2018. FINDINGS: Interval left total knee prosthesis in satisfactory position and  alignment. No fracture or dislocation. IMPRESSION: Satisfactory postoperative appearance of a left total knee prosthesis. Electronically Signed   By: Claudie Revering M.D.   On: 02/19/2018 13:14    Assessment/Plan: 1 Day Post-Op   Active Problems:   Total knee replacement status, left   Up with therapy Plan for discharge tomorrow if cleared by PT   Lovell Sheehan , MD 02/20/2018, 7:46 AM

## 2018-02-20 NOTE — Care Management Note (Signed)
Case Management Note  Patient Details  Name: LAURENCIA ROMA MRN: [id number] Date of Birth: 06-27-1946  Subjective/Objective:  Met with patient to discuss discharge planning. Patient will need a walker. Ordered from [place] with [facility] PT recommending OP PT. Patient has an appointment set up with Dr. Harlow Mares office for June 10 th. She will discharge on ASA. No other needs identified.                    Action/Plan:   Expected Discharge Date:  02/21/18               Expected Discharge Plan:  OP Rehab  In-House Referral:     Discharge planning Services  CM Consult  Post Acute Care Choice:  Durable Medical Equipment Choice offered to:  Patient  DME Arranged:  Gilford Rile rolling DME Agency:  [place]:    [facility] Agency:  [place]  Status of Service:  In process, will continue to follow  If discussed at Long Length of Stay Meetings, dates discussed:    Additional Comments:  Jolly Mango, RN 02/20/2018, 4:05 PM

## 2018-02-20 NOTE — Progress Notes (Signed)
Clinical Social Worker (CSW) received SNF consult. PT is recommending outpatient PT. RN case manager aware of above. Please reconsult if future social work needs arise. CSW signing off.   Trigg Delarocha, LCSW [phone]  

## 2018-02-20 NOTE — Progress Notes (Signed)
Physical Therapy Evaluation Patient Details Name: Hayley Simmons MRN: [id number] DOB: February 24, 1946 Today's Date: 02/20/2018   History of Present Illness  Pt underwent L TKR without reported post-op complications. PMH includes anxiety, OA, GERD, and HTN. PT evaluation attempted on POD#0 but was unable to be performed so completed on POD#1.  Clinical Impression  Pt admitted with above diagnosis. Pt currently with functional limitations due to the deficits listed below (see PT Problem List).  Pt requires minA+1 to assist LLE with bed mobility. CGA only for transfers and ambulation. She is able to ambulate to door and back to recliner with rolling walker. Cues for proper sequencing. UE support on walker and decreased weight shifting to LLE. Denies DOE with ambulation and VSS. She is able to complete all supine exercises as instructed. AAROM is -1 to 90 degrees, pain limited. Pt will benefit from PT services to address deficits in strength, balance, and mobility in order to return to full function at home.     Follow Up Recommendations Outpatient PT    Equipment Recommendations  None recommended by PT    Recommendations for Other Services       Precautions / Restrictions Precautions Precautions: Fall;Knee Required Braces or Orthoses: Knee Immobilizer - Left Knee Immobilizer - Left: Discontinue once straight leg raise with < 10 degree lag Restrictions Weight Bearing Restrictions: Yes LLE Weight Bearing: Weight bearing as tolerated      Mobility  Bed Mobility Overal bed mobility: Needs Assistance Bed Mobility: Supine to Sit     Supine to sit: Min assist     General bed mobility comments: Pt requires minA+1 to assist LLE when moving to EOB. HOB elevated and use of bed rails  Transfers Overall transfer level: Needs assistance Equipment used: Rolling walker (2 wheeled) Transfers: Sit to/from Stand Sit to Stand: Min guard         General transfer comment: Cues for safe hand  placement during both phases of transfer to/from standing. Increased time and decreased weight shift to LLE  Ambulation/Gait Ambulation/Gait assistance: Min guard Ambulation Distance (Feet): 25 Feet Assistive device: Rolling walker (2 wheeled) Gait Pattern/deviations: Step-to pattern Gait velocity: Below baseline   General Gait Details: Pt able to ambulate to door and back to recliner with rolling walker. Cues for proper sequencing. UE support on walker and decreased weight shifting to LLE. Denies DOE with ambulation and VSS  Stairs            Wheelchair Mobility    Modified Rankin (Stroke Patients Only)       Balance Overall balance assessment: Needs assistance Sitting-balance support: No upper extremity supported Sitting balance-Leahy Scale: Good     Standing balance support: Bilateral upper extremity supported Standing balance-Leahy Scale: Fair Standing balance comment: Able to balance with light UE support on walker                             Pertinent Vitals/Pain Pain Assessment: No/denies pain    Home Living Family/patient expects to be discharged to:: Private residence Living Arrangements: Spouse/significant other Available Help at Discharge: Family Type of Home: House Home Access: Stairs to enter Entrance Stairs-Rails: Right Entrance Stairs-Number of Steps: 3 Home Layout: One level Home Equipment: Walker - 2 wheels;Cane - single point;Bedside commode;Shower seat      Prior Function Level of Independence: Needs assistance   Gait / Transfers Assistance Needed: Pt initially ambulated with spc but progressed to RW prior to  surgery secondary to pain. Denies falls  ADL's / Homemaking Assistance Needed: Independent with ADLs but requires assist with IADLs        Hand Dominance   Dominant Hand: Right    Extremity/Trunk Assessment   Upper Extremity Assessment Upper Extremity Assessment: Overall WFL for tasks assessed    Lower Extremity  Assessment Lower Extremity Assessment: LLE deficits/detail LLE Deficits / Details: Able to perform LLE SLR with two finger assist. SAQ without assistance. Full DF/PF. Reports fully intact sensation       Communication   Communication: No difficulties  Cognition Arousal/Alertness: Awake/alert Behavior During Therapy: WFL for tasks assessed/performed Overall Cognitive Status: Within Functional Limits for tasks assessed                                        General Comments      Exercises Total Joint Exercises Ankle Circles/Pumps: Both;10 reps;Supine Quad Sets: Both;10 reps;Supine Gluteal Sets: Both;10 reps;Supine Towel Squeeze: Both;10 reps;Supine Short Arc Quad: Left;10 reps;Supine Heel Slides: Left;10 reps;Supine Hip ABduction/ADduction: Left;10 reps;Supine Straight Leg Raises: Left;10 reps;Supine Goniometric ROM: -1 to 90 degrees AAROM, pain limited in flexion and extension   Assessment/Plan    PT Assessment Patient needs continued PT services  PT Problem List Decreased strength;Decreased range of motion;Decreased activity tolerance;Decreased balance       PT Treatment Interventions DME instruction;Gait training;Stair training;Functional mobility training;Therapeutic activities;Therapeutic exercise;Balance training    PT Goals (Current goals can be found in the Care Plan section)  Acute Rehab PT Goals Patient Stated Goal: Improved pain-free function at home PT Goal Formulation: With patient Time For Goal Achievement: 03/06/18 Potential to Achieve Goals: Good    Frequency BID   Barriers to discharge        Co-evaluation               AM-PAC PT "6 Clicks" Daily Activity  Outcome Measure Difficulty turning over in bed (including adjusting bedclothes, sheets and blankets)?: Unable Difficulty moving from lying on back to sitting on the side of the bed? : Unable Difficulty sitting down on and standing up from a chair with arms (e.g.,  wheelchair, bedside commode, etc,.)?: A Little Help needed moving to and from a bed to chair (including a wheelchair)?: A Little Help needed walking in hospital room?: A Little Help needed climbing 3-5 steps with a railing? : Total 6 Click Score: 12    End of Session Equipment Utilized During Treatment: Gait belt Activity Tolerance: Patient tolerated treatment well Patient left: in chair;with call bell/phone within reach;with chair alarm set;with SCD's reapplied;Other (comment)(towel roll under heel, polar care in place)   PT Visit Diagnosis: Unsteadiness on feet (R26.81);Muscle weakness (generalized) (M62.81);Difficulty in walking, not elsewhere classified (R26.2)    Time: 9242-6834 PT Time Calculation (min) (ACUTE ONLY): 36 min   Charges:   PT Evaluation $PT Eval Low Complexity: 1 Low PT Treatments $Therapeutic Exercise: 8-22 mins   PT G Codes:        Lyndel Safe Huprich PT, DPT    Huprich,Jason 02/20/2018, 11:06 AM

## 2018-02-21 LAB — CBC
HCT: 32.4 % — ABNORMAL LOW (ref 35.0–47.0)
HEMOGLOBIN: 11 g/dL — AB (ref 12.0–16.0)
MCH: 29 pg (ref 26.0–34.0)
MCHC: 33.9 g/dL (ref 32.0–36.0)
MCV: 85.4 fL (ref 80.0–100.0)
Platelets: 250 10*3/uL (ref 150–440)
RBC: 3.79 MIL/uL — ABNORMAL LOW (ref 3.80–5.20)
RDW: 14.1 % (ref 11.5–14.5)
WBC: 8 10*3/uL (ref 3.6–11.0)

## 2018-02-21 MED ORDER — ASPIRIN 81 MG PO CHEW: 81.0000 mg | CHEWABLE_TABLET | Freq: Two times a day (BID) | ORAL | 0 refills | Status: DC

## 2018-02-21 MED ORDER — HYDROCODONE-ACETAMINOPHEN 5-325 MG PO TABS: 1.0000 | ORAL_TABLET | ORAL | 0 refills | Status: DC | PRN

## 2018-02-21 MED ORDER — DOCUSATE SODIUM 100 MG PO CAPS: 100.0000 mg | ORAL_CAPSULE | Freq: Two times a day (BID) | ORAL | 0 refills | Status: DC

## 2018-02-21 NOTE — Progress Notes (Signed)
Physical Therapy Treatment Patient Details Name: Hayley Simmons MRN: [id number] DOB: February 20, 1946 Today's Date: 02/21/2018    History of Present Illness Pt underwent L TKR without reported post-op complications. PMH includes anxiety, OA, GERD, and HTN. PT evaluation attempted on POD#0 but was unable to be performed so completed on POD#1.    PT Comments    Pt continues to make progress. Pt is again able to ambulate from her room to the rehab gym and back. Gait is slow but improved from AM session. Step length is improved. Advised pt to decrease UE support on walker. Decreased L knee flexion noted during gait. Speed improves throughout distance. Pt educated about proper sequencing with stairs using R railing and single point cane in LUE. She is able to complete 4 stairs with good safety and stability however takes extended time. She has completed all PT goals for discharge. She is safe to return home when medically appropriate. Pt will benefit from PT services to address deficits in strength, balance, and mobility in order to return to full function at home.     Follow Up Recommendations  Outpatient PT     Equipment Recommendations  None recommended by PT    Recommendations for Other Services       Precautions / Restrictions Precautions Precautions: Fall;Knee Precaution Booklet Issued: Yes (comment) Required Braces or Orthoses: Knee Immobilizer - Left Knee Immobilizer - Left: Discontinue once straight leg raise with < 10 degree lag Restrictions Weight Bearing Restrictions: Yes LLE Weight Bearing: Weight bearing as tolerated    Mobility  Bed Mobility               General bed mobility comments: Received and left upright in recliner  Transfers Overall transfer level: Needs assistance Equipment used: Rolling walker (2 wheeled) Transfers: Sit to/from Stand Sit to Stand: Min guard         General transfer comment: Pt is able to transfer with heavy UE support and increase in  time but no external support from therapist. She is steady once upright in walker with bilateral UE support  Ambulation/Gait Ambulation/Gait assistance: Min guard Ambulation Distance (Feet): 200 Feet Assistive device: Rolling walker (2 wheeled) Gait Pattern/deviations: Step-to pattern Gait velocity: Below baseline   General Gait Details: Pt is again able to ambulate from her room to the rehab gym and back. Gait is slow but improved from AM session. Step length is improved. Advised pt to decrease UE support on walker. Decreased L knee flexion noted during gait. Speed improves throughout distance.    Stairs Stairs: Yes Stairs assistance: Min guard Stair Management: Step to pattern;One rail Right;With cane Number of Stairs: 4 General stair comments: Pt educated about proper sequencing with stairs using R railing and single point cane in LUE. She is able to complete 4 stairs with good safety and stability however takes extended time.   Wheelchair Mobility    Modified Rankin (Stroke Patients Only)       Balance Overall balance assessment: Needs assistance Sitting-balance support: No upper extremity supported Sitting balance-Leahy Scale: Good     Standing balance support: Bilateral upper extremity supported Standing balance-Leahy Scale: Fair Standing balance comment: Able to balance with light UE support on walker                            Cognition Arousal/Alertness: Awake/alert Behavior During Therapy: WFL for tasks assessed/performed Overall Cognitive Status: Within Functional Limits for tasks assessed  Exercises      General Comments        Pertinent Vitals/Pain Pain Assessment: No/denies pain    Home Living                      Prior Function            PT Goals (current goals can now be found in the care plan section) Acute Rehab PT Goals Patient Stated Goal: Improved pain-free  function at home PT Goal Formulation: With patient Time For Goal Achievement: 03/06/18 Potential to Achieve Goals: Good Progress towards PT goals: Progressing toward goals    Frequency    BID      PT Plan Current plan remains appropriate    Co-evaluation              AM-PAC PT "6 Clicks" Daily Activity  Outcome Measure  Difficulty turning over in bed (including adjusting bedclothes, sheets and blankets)?: A Little Difficulty moving from lying on back to sitting on the side of the bed? : A Little Difficulty sitting down on and standing up from a chair with arms (e.g., wheelchair, bedside commode, etc,.)?: A Little Help needed moving to and from a bed to chair (including a wheelchair)?: A Little Help needed walking in hospital room?: A Little Help needed climbing 3-5 steps with a railing? : A Lot 6 Click Score: 17    End of Session Equipment Utilized During Treatment: Gait belt Activity Tolerance: Patient tolerated treatment well Patient left: with call bell/phone within reach;with SCD's reapplied;Other (comment);in bed;with bed alarm set(towel roll under heel, polar care in place) Nurse Communication: Mobility status PT Visit Diagnosis: Unsteadiness on feet (R26.81);Muscle weakness (generalized) (M62.81);Difficulty in walking, not elsewhere classified (R26.2)     Time: 6387-5643 PT Time Calculation (min) (ACUTE ONLY): 29 min  Charges:  $Gait Training: 23-37 mins                    G Codes:       Lyndel Safe Triton Heidrich PT, DPT     Shuree Brossart 02/21/2018, 3:08 PM

## 2018-02-21 NOTE — Discharge Summary (Signed)
Physician Discharge Summary  Patient ID: Hayley Simmons MRN: [id number] DOB/AGE: May 30, 1946 72 y.o.  Admit date: 02/19/2018 Discharge date: 02/21/2018  Admission Diagnoses:  M17.12 Unilateral primary osteoarthritis, left knee <principal problem not specified>  Discharge Diagnoses:  M17.12 Unilateral primary osteoarthritis, left knee Active Problems:   Total knee replacement status, left   Past Medical History:  Diagnosis Date  . Allergy   . Anxiety   . GERD (gastroesophageal reflux disease)   . Hyperlipidemia   . Hypertension   . Osteoarthritis   . Shingles     Surgeries: Procedure(s): TOTAL KNEE ARTHROPLASTY on 02/19/2018   Consultants (if any):   Discharged Condition: Improved  Hospital Course: Hayley Simmons is an 72 y.o. female who was admitted 02/19/2018 with a diagnosis of  M17.12 Unilateral primary osteoarthritis, left knee <principal problem not specified> and went to the operating room on 02/19/2018 and underwent the above named procedures.    She was given perioperative antibiotics:  Anti-infectives (From admission, onward)   Start     Dose/Rate Route Frequency Ordered Stop   02/19/18 1415  ceFAZolin (ANCEF) IVPB 2g/100 mL premix     2 g 200 mL/hr over 30 Minutes Intravenous Every 6 hours 02/19/18 1414 02/19/18 2135   02/19/18 1201  50,000 units bacitracin in 0.9% normal saline 250 mL irrigation  Status:  Discontinued       As needed 02/19/18 1205 02/19/18 1234   02/19/18 0600  ceFAZolin (ANCEF) IVPB 2g/100 mL premix     2 g 200 mL/hr over 30 Minutes Intravenous On call to O.R. 02/18/18 2149 02/19/18 1058    .  She was given sequential compression devices, early ambulation, and ECASA for DVT prophylaxis.  She benefited maximally from the hospital stay and there were no complications.    Recent vital signs:  Vitals:   02/20/18 2335 02/21/18 0854  BP: (!) 142/77 136/67  Pulse: 70 75  Resp: 16 18  Temp: 97.9 F (36.6 C) 98.1 F (36.7 C)  SpO2: 92% 96%     Recent laboratory studies:  Lab Results  Component Value Date   HGB 11.0 (L) 02/21/2018   HGB 11.4 (L) 02/20/2018   HGB 12.9 02/07/2018   Lab Results  Component Value Date   WBC 8.0 02/21/2018   PLT 250 02/21/2018   Lab Results  Component Value Date   INR 1.05 02/07/2018   Lab Results  Component Value Date   NA 133 (L) 02/20/2018   K 3.5 02/20/2018   CL 101 02/20/2018   CO2 22 02/20/2018   BUN 18 02/20/2018   CREATININE 1.24 (H) 02/20/2018   GLUCOSE 123 (H) 02/20/2018    Discharge Medications:   Allergies as of 02/21/2018      Reactions   Caduet  [amlodipine-atorvastatin] Other (See Comments)   Makes Pt feel bad makes pt feel bad   Cyclosporine Other (See Comments), Nausea And Vomiting, Nausea Only   Escitalopram Other (See Comments)      Medication List    STOP taking these medications   aspirin EC 81 MG tablet Replaced by:  aspirin 81 MG chewable tablet     TAKE these medications   acetaminophen 500 MG tablet Commonly known as:  TYLENOL Take 1,000 mg by mouth every 6 (six) hours as needed (for pain/headaches.).   aspirin 81 MG chewable tablet Chew 1 tablet (81 mg total) by mouth 2 (two) times daily. Replaces:  aspirin EC 81 MG tablet   CALCIUM 600+D3 PO Take 1  tablet by mouth 2 (two) times daily.   diclofenac sodium 1 % Gel Commonly known as:  VOLTAREN Apply 1-2 g topically 4 (four) times daily as needed (for pain.).   docusate sodium 100 MG capsule Commonly known as:  COLACE Take 1 capsule (100 mg total) by mouth 2 (two) times daily.   DULoxetine 30 MG capsule Commonly known as:  CYMBALTA Take 1-2 capsules (30-60 mg total) by mouth daily. 1 for the first week after that 2 daily   fluticasone 50 MCG/ACT nasal spray Commonly known as:  FLONASE Place 1-2 sprays into both nostrils daily as needed for allergies.   HYDROcodone-acetaminophen 5-325 MG tablet Commonly known as:  NORCO/VICODIN Take 1-2 tablets by mouth every 4 (four) hours as  needed for moderate pain (pain score 4-6).   metoprolol tartrate 100 MG tablet Commonly known as:  LOPRESSOR Take 1 tablet (100 mg total) by mouth 2 (two) times daily.   olmesartan-hydrochlorothiazide 20-12.5 MG tablet Commonly known as:  BENICAR HCT Take 1 tablet by mouth daily.   omeprazole 20 MG capsule Commonly known as:  PRILOSEC Take 1 capsule (20 mg total) by mouth daily. What changed:    when to take this  additional instructions   rosuvastatin 40 MG tablet Commonly known as:  CRESTOR Take 1 tablet (40 mg total) by mouth daily. In place of Atorvastatin What changed:    when to take this  additional instructions   traMADol 50 MG tablet Commonly known as:  ULTRAM Take 50 mg by mouth daily.            Durable Medical Equipment  (From admission, onward)        Start     Ordered   02/21/18 0912  For home use only DME Walker  Once    Question:  Patient needs a walker to treat with the following condition  Answer:  Weakness   02/21/18 0912      Diagnostic Studies: Dg Chest 2 View  Result Date: 02/07/2018 CLINICAL DATA:  Preoperative respiratory evaluation. EXAM: CHEST - 2 VIEW COMPARISON:  None. FINDINGS: The lungs are clear without focal pneumonia, edema, pneumothorax or pleural effusion. Linear subsegmental atelectasis or scar noted in the infrahilar right lung base. The cardiopericardial silhouette is within normal limits for size. The visualized bony structures of the thorax are intact. IMPRESSION: No active cardiopulmonary disease. Electronically Signed   By: Misty Stanley M.D.   On: 02/07/2018 16:26   Dg Knee Left Port  Result Date: 02/19/2018 CLINICAL DATA:  Left total knee replacement. EXAM: PORTABLE LEFT KNEE - 1-2 VIEW COMPARISON:  06/24/2015 and MR dated 01/20/2018. FINDINGS: Interval left total knee prosthesis in satisfactory position and alignment. No fracture or dislocation. IMPRESSION: Satisfactory postoperative appearance of a left total knee  prosthesis. Electronically Signed   By: Claudie Revering M.D.   On: 02/19/2018 13:14   Dg Bone Length  Result Date: 01/25/2018 CLINICAL DATA:  Left knee osteoarthritis EXAM: BONE LENGTH COMPARISON:  None. FINDINGS: No acute fracture or dislocation.  No aggressive osseous lesion. Left lower extremity length measures 85 cm from the articular surface along the superior aspect of the femoral head to the mid tibial plafond. Slight varus angulation at the knee. Moderate-severe medial femorotibial compartment joint space narrowing consistent with osteoarthritis with small marginal osteophytes. No soft tissue abnormality. IMPRESSION: 1. No osseous abnormality of left lower extremity. Electronically Signed   By: Kathreen Devoid   On: 01/25/2018 07:55    Disposition: Discharge disposition:  01-Home or Self Care         Follow-up Information    Lovell Sheehan, MD On 03/01/2018.   Specialty:  Orthopedic Surgery Why:  follow up @9 :40am Contact information: [address] [place] [address] [phone]            Signed: Lovell Sheehan ,MD 02/21/2018, 11:17 AM

## 2018-02-21 NOTE — Progress Notes (Signed)
Pt ready for d/c home today per MD after 2 PT sessions. Pt met PT goals, walker was delivered to pt's room. VSS, PIV removed. Discharge instructions and prescriptions reviewed with pt, all questions answered. Pt's husband will be picking pt up to transport her home.   [place], Jerry Caras

## 2018-02-21 NOTE — Discharge Instructions (Signed)
Continue weight bear as tolerated on the left lower extremity.    Elevate the left lower extremity whenever possible and continue the polar care while elevating the extremity. Patient may shower. No bath or submerging the wound.    Take ECASA as directed for blood clot prevention.  Continue to work on knee range of motion exercises at home as instructed by physical therapy. Continue to use a walker for assistance with ambulation until cleared by physical therapy.  Call [phone] with any questions, such as fever > 101.5 degrees, drainage from the wound or shortness of breath.   Peripheral Nerve Block (Lower Extremity) Discharge Instructions   1.  For your surgery you have received a femoral Nerve Block.  2. Your Nerve Block is expected to last for about 4 to 12 hours.  This is an estimated time frame; the results of your nerve block may wear off sooner or may last longer.  3. If needed, your surgeon will give you a prescription for pain medication.  It will take about 60 minutes for the oral pain medication to become fully effective.  So, it is recommended that you start taking this medication before the nerve block first begins to wear off, or when you first begin to feel discomfort.  4. Keep in mind that nerve blocks often wear off in the middle of the night.   If you are going to bed and the block has not started to wear off or you have not started to have any discomfort, consider setting an alarm for 2 to 3 hours, so you can assess your block.  If you notice the block is wearing off or you are starting to have discomfort, you can take your pain medication.  5. Take your pain medication only as prescribed.  Pain medication can cause sedation and decrease your breathing if you take more than you need for the level of pain that you have.  6. Nausea is a common side effect of many pain medications.  You may want to eat something before taking your pain medicine to prevent  nausea.  7. After a Peripheral Nerve block, you cannot feel pain, pressure or extremes in temperature in the effected leg.  Because your leg is numb it is at an increased risk for injury.  To decrease the possibility of injury, please practice the following:  a. While you are awake change the position of your leg frequently to prevent too much pressure on any one area for prolonged periods of time.  b.  If you have a cast or tight dressing, check the color or your toes every couple of hours.  Call your surgeon with the appearance of any discoloration (white or blue).  c. You may have difficulty bearing weight on the effected leg.  Have someone assist you with walking until the nerve block has completely worn off.   d. If you surgeon prescribed a brace to be worn after surgery, DO NOT GET UP AT NIGHT WITHOUT YOUR BRACE.  e. If your surgeon has restricted the amount of weight you should bear on the effected leg, i.e. No Weight, Partial Weight, or Touch Down Only, DO NOT BEAR MORE WEIGHT THAN INSTRUCTED.   If you experience any problems or concerns, please contact your surgeon.

## 2018-02-21 NOTE — Progress Notes (Signed)
Physical Therapy Treatment Patient Details Name: Hayley Simmons MRN: [id number] DOB: August 14, 1946 Today's Date: 02/21/2018    History of Present Illness Pt underwent L TKR without reported post-op complications. PMH includes anxiety, OA, GERD, and HTN. PT evaluation attempted on POD#0 but was unable to be performed so completed on POD#1.    PT Comments    Pt continues to make excellent progress with therapy today. Pt is able to transfer from bed and from commode without external support from therapist. Multiple attempts to come to standing from bed with heavy UE support. Decreased weight shifting to LLE. She is able to ambulate from her room to the rehab gym and back with rolling walker. Gait is slow and cues provided to increase R step length. Advised to decrease UE support on walker. Decreased L knee flexion noted during gait. Speed improves throughout distance.  Pt educated about proper sequencing with stairs. She is able to complete 4 stairs with good safety and stability. Education about how to perform sideways with one railing. Pt has met all PT goals for discharge and is safe to return home with support from family when medically appropriate.  Pt will benefit from PT services to address deficits in strength, balance, and mobility in order to return to full function at home.   Follow Up Recommendations  Outpatient PT     Equipment Recommendations  None recommended by PT    Recommendations for Other Services       Precautions / Restrictions Precautions Precautions: Fall;Knee Precaution Booklet Issued: Yes (comment) Required Braces or Orthoses: Knee Immobilizer - Left Knee Immobilizer - Left: Discontinue once straight leg raise with < 10 degree lag Restrictions Weight Bearing Restrictions: Yes LLE Weight Bearing: Weight bearing as tolerated    Mobility  Bed Mobility Overal bed mobility: Needs Assistance Bed Mobility: Sit to Supine     Supine to sit: Supervision     General  bed mobility comments: Pt requires cues for sequencing during bed mobility but no external assist  Transfers Overall transfer level: Needs assistance Equipment used: Rolling walker (2 wheeled) Transfers: Sit to/from Stand Sit to Stand: Min guard         General transfer comment: Pt is able to transfer from bed and from commode without external support from therapist. Multiple attempts to come to standing from bed with heavy UE support. Decreased weight shifting to LLE  Ambulation/Gait Ambulation/Gait assistance: Min guard Ambulation Distance (Feet): 200 Feet Assistive device: Rolling walker (2 wheeled) Gait Pattern/deviations: Step-to pattern Gait velocity: Below baseline   General Gait Details: Pt ambulates from her room to the rehab gym and back. Gait is slow and cues provided to increase R step length. Advised to decrease UE support on walker. Decreased L knee flexion noted during gait. Speed improves throughout distance.    Stairs Stairs: Yes Stairs assistance: Min guard Stair Management: Two rails;Step to pattern Number of Stairs: 4 General stair comments: Pt educated about proper sequencing with stairs. She is able to complete 4 stairs with good safety and stability. Pt educated about how to perform using unilateral R railing at home while stepping sideways   Wheelchair Mobility    Modified Rankin (Stroke Patients Only)       Balance Overall balance assessment: Needs assistance Sitting-balance support: No upper extremity supported Sitting balance-Leahy Scale: Good     Standing balance support: Bilateral upper extremity supported Standing balance-Leahy Scale: Fair Standing balance comment: Able to balance with light UE support on walker  Cognition Arousal/Alertness: Awake/alert Behavior During Therapy: WFL for tasks assessed/performed Overall Cognitive Status: Within Functional Limits for tasks assessed                                         Exercises Total Joint Exercises Ankle Circles/Pumps: Both;15 reps;Seated;Other (comment)(Heel raises) Quad Sets: Both;Supine;15 reps Gluteal Sets: Both;Supine;15 reps Towel Squeeze: Both;Supine;15 reps Heel Slides: Left;Supine;15 reps Hip ABduction/ADduction: Both;15 reps;Seated Straight Leg Raises: Left;Supine;15 reps Long Arc Quad: Left;15 reps;Seated Knee Flexion: Left;15 reps;Seated Goniometric ROM: -1 to 86 degrees AAROM Marching in Standing: Both;15 reps;Seated    General Comments        Pertinent Vitals/Pain Pain Assessment: 0-10 Pain Score: 1  Pain Location: L knee Pain Descriptors / Indicators: Sharp Pain Intervention(s): Premedicated before session    Home Living                      Prior Function            PT Goals (current goals can now be found in the care plan section) Acute Rehab PT Goals Patient Stated Goal: Improved pain-free function at home PT Goal Formulation: With patient Time For Goal Achievement: 03/06/18 Potential to Achieve Goals: Good Progress towards PT goals: Progressing toward goals    Frequency    BID      PT Plan Current plan remains appropriate    Co-evaluation              AM-PAC PT "6 Clicks" Daily Activity  Outcome Measure  Difficulty turning over in bed (including adjusting bedclothes, sheets and blankets)?: A Little Difficulty moving from lying on back to sitting on the side of the bed? : A Little Difficulty sitting down on and standing up from a chair with arms (e.g., wheelchair, bedside commode, etc,.)?: A Little Help needed moving to and from a bed to chair (including a wheelchair)?: A Little Help needed walking in hospital room?: A Little Help needed climbing 3-5 steps with a railing? : A Lot 6 Click Score: 17    End of Session Equipment Utilized During Treatment: Gait belt Activity Tolerance: Patient tolerated treatment well Patient left: with call  bell/phone within reach;with SCD's reapplied;Other (comment);in bed;with bed alarm set(towel roll under heel, polar care in place) Nurse Communication: Mobility status PT Visit Diagnosis: Unsteadiness on feet (R26.81);Muscle weakness (generalized) (M62.81);Difficulty in walking, not elsewhere classified (R26.2)     Time: 1308-6578 PT Time Calculation (min) (ACUTE ONLY): 50 min  Charges:  $Gait Training: 8-22 mins $Therapeutic Exercise: 8-22 mins                    G Codes:       Lyndel Safe Everli Rother PT, DPT     Ammiel Guiney 02/21/2018, 11:25 AM

## 2018-02-26 DIAGNOSIS — M25562 Pain in left knee: Secondary | ICD-10-CM | POA: Diagnosis not present

## 2018-02-26 DIAGNOSIS — R600 Localized edema: Secondary | ICD-10-CM | POA: Diagnosis not present

## 2018-02-26 DIAGNOSIS — M25662 Stiffness of left knee, not elsewhere classified: Secondary | ICD-10-CM | POA: Diagnosis not present

## 2018-03-01 DIAGNOSIS — R600 Localized edema: Secondary | ICD-10-CM | POA: Diagnosis not present

## 2018-03-01 DIAGNOSIS — M25662 Stiffness of left knee, not elsewhere classified: Secondary | ICD-10-CM | POA: Diagnosis not present

## 2018-03-01 DIAGNOSIS — M25562 Pain in left knee: Secondary | ICD-10-CM | POA: Diagnosis not present

## 2018-03-06 DIAGNOSIS — R600 Localized edema: Secondary | ICD-10-CM | POA: Diagnosis not present

## 2018-03-06 DIAGNOSIS — M25662 Stiffness of left knee, not elsewhere classified: Secondary | ICD-10-CM | POA: Diagnosis not present

## 2018-03-06 DIAGNOSIS — M25562 Pain in left knee: Secondary | ICD-10-CM | POA: Diagnosis not present

## 2018-03-06 LAB — COLOGUARD: COLOGUARD: POSITIVE

## 2018-03-07 ENCOUNTER — Telehealth: Payer: Self-pay

## 2018-03-07 ENCOUNTER — Encounter: Payer: Self-pay | Admitting: Family Medicine

## 2018-03-07 DIAGNOSIS — R195 Other fecal abnormalities: Secondary | ICD-10-CM

## 2018-03-07 NOTE — Progress Notes (Unsigned)
Called to inform them of their positive and we are making her referral to GI. Called patient but she kept hanging up on me.

## 2018-03-07 NOTE — Telephone Encounter (Signed)
Called to inform them of their positive and we are making her referral to GI. Called patient but she kept hanging up on me.

## 2018-03-08 DIAGNOSIS — M25662 Stiffness of left knee, not elsewhere classified: Secondary | ICD-10-CM | POA: Diagnosis not present

## 2018-03-08 DIAGNOSIS — M25562 Pain in left knee: Secondary | ICD-10-CM | POA: Diagnosis not present

## 2018-03-08 DIAGNOSIS — R600 Localized edema: Secondary | ICD-10-CM | POA: Diagnosis not present

## 2018-03-13 DIAGNOSIS — R600 Localized edema: Secondary | ICD-10-CM | POA: Diagnosis not present

## 2018-03-13 DIAGNOSIS — M25662 Stiffness of left knee, not elsewhere classified: Secondary | ICD-10-CM | POA: Diagnosis not present

## 2018-03-13 DIAGNOSIS — M25562 Pain in left knee: Secondary | ICD-10-CM | POA: Diagnosis not present

## 2018-03-15 DIAGNOSIS — M25662 Stiffness of left knee, not elsewhere classified: Secondary | ICD-10-CM | POA: Diagnosis not present

## 2018-03-15 DIAGNOSIS — M25562 Pain in left knee: Secondary | ICD-10-CM | POA: Diagnosis not present

## 2018-03-20 DIAGNOSIS — M25662 Stiffness of left knee, not elsewhere classified: Secondary | ICD-10-CM | POA: Diagnosis not present

## 2018-03-20 DIAGNOSIS — M25562 Pain in left knee: Secondary | ICD-10-CM | POA: Diagnosis not present

## 2018-03-26 DIAGNOSIS — Z96659 Presence of unspecified artificial knee joint: Secondary | ICD-10-CM | POA: Diagnosis not present

## 2018-03-28 DIAGNOSIS — M25662 Stiffness of left knee, not elsewhere classified: Secondary | ICD-10-CM | POA: Diagnosis not present

## 2018-03-28 DIAGNOSIS — M25562 Pain in left knee: Secondary | ICD-10-CM | POA: Diagnosis not present

## 2018-03-30 DIAGNOSIS — M25562 Pain in left knee: Secondary | ICD-10-CM | POA: Diagnosis not present

## 2018-03-30 DIAGNOSIS — M25662 Stiffness of left knee, not elsewhere classified: Secondary | ICD-10-CM | POA: Diagnosis not present

## 2018-04-04 ENCOUNTER — Encounter: Payer: Self-pay | Admitting: *Deleted

## 2018-05-22 ENCOUNTER — Ambulatory Visit: Payer: Medicare Other

## 2018-05-22 ENCOUNTER — Encounter: Payer: Self-pay | Admitting: Family Medicine

## 2018-05-22 ENCOUNTER — Ambulatory Visit (INDEPENDENT_AMBULATORY_CARE_PROVIDER_SITE_OTHER): Payer: Medicare Other | Admitting: Family Medicine

## 2018-05-22 VITALS — BP 108/64 | HR 68 | Temp 97.6°F | Resp 16 | Ht 61.0 in | Wt 187.2 lb

## 2018-05-22 DIAGNOSIS — M2550 Pain in unspecified joint: Secondary | ICD-10-CM

## 2018-05-22 DIAGNOSIS — E1169 Type 2 diabetes mellitus with other specified complication: Secondary | ICD-10-CM

## 2018-05-22 DIAGNOSIS — D649 Anemia, unspecified: Secondary | ICD-10-CM

## 2018-05-22 DIAGNOSIS — R195 Other fecal abnormalities: Secondary | ICD-10-CM

## 2018-05-22 DIAGNOSIS — R634 Abnormal weight loss: Secondary | ICD-10-CM

## 2018-05-22 DIAGNOSIS — E785 Hyperlipidemia, unspecified: Secondary | ICD-10-CM

## 2018-05-22 DIAGNOSIS — Z23 Encounter for immunization: Secondary | ICD-10-CM

## 2018-05-22 DIAGNOSIS — F320 Major depressive disorder, single episode, mild: Secondary | ICD-10-CM | POA: Diagnosis not present

## 2018-05-22 DIAGNOSIS — I10 Essential (primary) hypertension: Secondary | ICD-10-CM

## 2018-05-22 DIAGNOSIS — E039 Hypothyroidism, unspecified: Secondary | ICD-10-CM

## 2018-05-22 DIAGNOSIS — E038 Other specified hypothyroidism: Secondary | ICD-10-CM

## 2018-05-22 DIAGNOSIS — E669 Obesity, unspecified: Secondary | ICD-10-CM

## 2018-05-22 DIAGNOSIS — F411 Generalized anxiety disorder: Secondary | ICD-10-CM | POA: Diagnosis not present

## 2018-05-22 MED ORDER — ROSUVASTATIN CALCIUM 40 MG PO TABS: 40.0000 mg | ORAL_TABLET | Freq: Every evening | ORAL | 1 refills | Status: DC

## 2018-05-22 MED ORDER — DULOXETINE HCL 30 MG PO CPEP: 30.0000 mg | ORAL_CAPSULE | Freq: Every day | ORAL | 0 refills | Status: DC

## 2018-05-22 MED ORDER — METOPROLOL TARTRATE 100 MG PO TABS: 100.0000 mg | ORAL_TABLET | Freq: Two times a day (BID) | ORAL | 1 refills | Status: DC

## 2018-05-22 MED ORDER — OLMESARTAN MEDOXOMIL-HCTZ 20-12.5 MG PO TABS: 1.0000 | ORAL_TABLET | Freq: Every day | ORAL | 1 refills | Status: DC

## 2018-05-22 NOTE — Progress Notes (Signed)
Name: Hayley Simmons   MRN: [id number]    DOB: 01-26-1946   Date:05/22/2018       Progress Note  Subjective  Chief Complaint  Chief Complaint  Patient presents with  . Medication Refill    Would like to discuss referral to Rheumatology Dr due to Hands Edema, knots on her hands and painful.   . Diabetes  . Dyslipidemia  . Gastroesophageal Reflux  . Hypertension    Denies any symptoms  . Depression  . Elevated TSH  . Dry Mouth    HPI   DMII: diagnosed July 2018, she has never been on medication. Denies polyphagia, polydipsia or polyuria. She is due for eye exam, we will check urine micro today.   Positive cologuard with weight loss, must see GI  Morbid obesity: she has lost another 19 lbs, positive cologuard, already referred to have colonoscopy but sates continues to have weakness and is not ready to have it done. Explained that she must have colonoscopy to see if she had colon cancer.   Elevate TSH, subclinical hypothyroidism. We will recheck labs today, still losing weight.   Dyslipidemia: on Crestor now, denies side effects of medication. Last LDL was at goal   HTN: bp is low today, she is not dizzy, discussed going down on lopressor since weight loss, but she wants to hold off on changing dose for now. No chest pain or palpiation  GERD: doing better, taking medication every other, discussed long term risk of PPI, such as dementia, osteoporosis, colitis and heart disease. Unchanged   Major Depression/ Anxiety: going on for years, struggles with relationship with her daughter, and it makes her sad. She has been taking alprazolam prn, we will stop that and try duloxetine to help with mood and pain she took for a month but not sure why she stopped  Arthralgia, started after left knee replacement 3 months ago, aching and swelling on both wrists, ankles, hands, and top of her feet. No redness or increase in warmth   Patient Active Problem List   Diagnosis Date Noted  .  Total knee replacement status, left 02/19/2018  . Chronic kidney disease, stage III (moderate) ([place]) 01/23/2018  . MI (mitral incompetence) 01/18/2018  . Diabetes mellitus type 2 in obese ([place]) 06/21/2016  . Elevated TSH 05/19/2016  . Osteoarthritis of both knees 06/24/2015  . Female stress incontinence 06/24/2015  . Hypertension 05/19/2015  . GERD (gastroesophageal reflux disease) 05/19/2015  . Hyperlipidemia 05/19/2015  . At risk for falling 05/19/2015  . Obesity, morbid ([place]) 05/19/2015    Past Surgical History:  Procedure Laterality Date  . ABDOMINAL HYSTERECTOMY  1994  . CARPAL TUNNEL RELEASE Right 07/29/2008  . FOOT SURGERY Right    bunion  . TOTAL KNEE ARTHROPLASTY Left 02/19/2018   Procedure: TOTAL KNEE ARTHROPLASTY;  Surgeon: Lovell Sheehan, MD;  Location: [facility] ORS;  Service: Orthopedics;  Laterality: Left;    Family History  Problem Relation Age of Onset  . Gout Mother   . Dementia Mother   . Kidney disease Mother   . Cancer Mother        kidney  . Heart attack Mother   . Heart attack Father   . Cancer Brother     Social History   Socioeconomic History  . Marital status: Married    Spouse name: Neldon Newport   . Number of children: 1  . Years of education: Not on file  . Highest education level: 12th grade  Occupational History  .  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 Financial resource strain: Not on file  . Food insecurity:    Worry: Not on file    Inability: Not on file  . Transportation needs:    Medical: Not on file    Non-medical: Not on file  Tobacco Use  . Smoking status: Former Smoker    Packs/day: 0.25    Years: 31.00    Pack years: 7.75    Types: Cigarettes    Last attempt to quit: 11/18/1997    Years since quitting: 20.5  . Smokeless tobacco: Never Used  . Tobacco comment: quit in 11/1997  Substance and Sexual Activity  . Alcohol use: No    Alcohol/week: 0.0  standard drinks  . Drug use: No  . Sexual activity: Not Currently  Lifestyle  . Physical activity:    Days per week: Not on file    Minutes per session: Not on file  . Stress: Not on file  Relationships  . Social connections:    Talks on phone: Not on file    Gets together: Not on file    Attends religious service: Not on file    Active member of club or organization: Not on file    Attends meetings of clubs or organizations: Not on file    Relationship status: Not on file  . Intimate partner violence:    Fear of current or ex partner: Not on file    Emotionally abused: Not on file    Physically abused: Not on file    Forced sexual activity: Not on file  Other Topics Concern  . Not on file  Social History Narrative  . Not on file     Current Outpatient Medications:  .  acetaminophen (TYLENOL) 500 MG tablet, Take 1,000 mg by mouth every 6 (six) hours as needed (for pain/headaches.)., Disp: , Rfl:  .  aspirin 81 MG chewable tablet, Chew 1 tablet (81 mg total) by mouth 2 (two) times daily., Disp: 30 tablet, Rfl: 0 .  Calcium Carb-Cholecalciferol (CALCIUM 600+D3 PO), Take 1 tablet by mouth 2 (two) times daily., Disp: , Rfl:  .  diclofenac sodium (VOLTAREN) 1 % GEL, Apply 1-2 g topically 4 (four) times daily as needed (for pain.)., Disp: , Rfl:  .  docusate sodium (COLACE) 100 MG capsule, Take 1 capsule (100 mg total) by mouth 2 (two) times daily., Disp: 10 capsule, Rfl: 0 .  fluticasone (FLONASE) 50 MCG/ACT nasal spray, Place 1-2 sprays into both nostrils daily as needed for allergies., Disp: , Rfl:  .  loratadine (CLARITIN) 10 MG tablet, as needed., Disp: , Rfl:  .  metoprolol tartrate (LOPRESSOR) 100 MG tablet, Take 1 tablet (100 mg total) by mouth 2 (two) times daily., Disp: 180 tablet, Rfl: 1 .  olmesartan-hydrochlorothiazide (BENICAR HCT) 20-12.5 MG tablet, Take 1 tablet by mouth daily., Disp: 90 tablet, Rfl: 1 .  omeprazole (PRILOSEC) 20 MG capsule, Take 1 capsule (20 mg  total) by mouth daily. (Patient taking differently: Take 20 mg by mouth every other day. MORNING.), Disp: 90 capsule, Rfl: 0 .  rosuvastatin (CRESTOR) 40 MG tablet, Take 1 tablet (40 mg total) by mouth every evening. In place of Atorvastatin, Disp: 90 tablet, Rfl: 1 .  traMADol (ULTRAM) 50 MG tablet, Take 50 mg by mouth daily., Disp: , Rfl:  .  DULoxetine (CYMBALTA) 30 MG capsule, Take 1-2 capsules (30-60  mg total) by mouth daily. 1 for the first week after that 2 daily, Disp: 60 capsule, Rfl: 0  Allergies  Allergen Reactions  . Caduet  [Amlodipine-Atorvastatin] Other (See Comments)    Makes Pt feel bad makes pt feel bad  . Cyclosporine Other (See Comments), Nausea And Vomiting and Nausea Only  . Escitalopram Other (See Comments)     ROS  Constitutional: Negative for fever or weight change.  Respiratory: Negative for cough and shortness of breath.   Cardiovascular: Negative for chest pain or palpitations.  Gastrointestinal: Negative for abdominal pain, no bowel changes.  Musculoskeletal: Negative for gait problem or joint swelling.  Skin: Negative for rash.  Neurological: Negative for dizziness or headache.  No other specific complaints in a complete review of systems (except as listed in HPI above).  Objective  Vitals:   05/22/18 1100  BP: 108/64  Pulse: 68  Resp: 16  Temp: 97.6 F (36.4 C)  TempSrc: Oral  SpO2: 97%  Weight: 187 lb 3.2 oz (84.9 kg)  Height: 5\' 1"  (1.549 m)    Body mass index is 35.37 kg/m.  Physical Exam  Constitutional: Patient appears well-developed and well-nourished. Obese  No distress.  HEENT: head atraumatic, normocephalic, pupils equal and reactive to light, neck supple, throat within normal limits Cardiovascular: Normal rate, regular rhythm and normal heart sounds.  No murmur heard. No BLE edema. Pulmonary/Chest: Effort normal and breath sounds normal. No respiratory distress. Abdominal: Soft.  There is no tenderness. Psychiatric: Patient  has a normal mood and affect. behavior is normal. Judgment and thought content normal. Muscular skeletal: well healed scar on left knee from recent knee replacement, swelling of both wrists, no redness but has mild increase in warmth on left wrist, pain during rom of both wrists, also with extension of right knee   PHQ2/9: Depression screen [facility] 2/9 05/22/2018 01/18/2018 10/13/2017 07/13/2017 04/11/2017  Decreased Interest 2 1 0 0 0  Down, Depressed, Hopeless 0 0 0 0 0  PHQ - 2 Score 2 1 0 0 0  Altered sleeping 2 2 - - -  Tired, decreased energy 3 1 - - -  Change in appetite 2 1 - - -  Feeling bad or failure about yourself  2 0 - - -  Trouble concentrating 0 0 - - -  Moving slowly or fidgety/restless 1 0 - - -  Suicidal thoughts 0 0 - - -  PHQ-9 Score 12 5 - - -  Difficult doing work/chores Not difficult at all Somewhat difficult - - -     Fall Risk: Fall Risk  01/18/2018 10/13/2017 07/13/2017 04/11/2017 11/07/2016  Falls in the past year? No No No No No     Functional Status Survey: Is the patient deaf or have difficulty hearing?: No Does the patient have difficulty seeing, even when wearing glasses/contacts?: Yes Does the patient have difficulty concentrating, remembering, or making decisions?: No Does the patient have difficulty walking or climbing stairs?: Yes Does the patient have difficulty dressing or bathing?: No Does the patient have difficulty doing errands alone such as visiting a doctor's office or shopping?: Yes    Assessment & Plan  1. Weight loss, abnormal  - CBC with Differential/Platelet - COMPLETE METABOLIC PANEL WITH GFR  2. Subclinical hypothyroidism  - TSH  3. Arthralgia, unspecified joint  - Sedimentation rate - C-reactive protein - Rheumatoid Factor - ANA,IFA RA Diag Pnl w/rflx Tit/Patn  4. Anemia, unspecified type  - CBC with Differential/Platelet - Iron, TIBC and  Ferritin Panel; Future  5. Essential hypertension  - metoprolol tartrate  (LOPRESSOR) 100 MG tablet; Take 1 tablet (100 mg total) by mouth 2 (two) times daily.  Dispense: 180 tablet; Refill: 1 - olmesartan-hydrochlorothiazide (BENICAR HCT) 20-12.5 MG tablet; Take 1 tablet by mouth daily.  Dispense: 90 tablet; Refill: 1  6. Mild major depression (HCC)  - DULoxetine (CYMBALTA) 30 MG capsule; Take 1-2 capsules (30-60 mg total) by mouth daily. 1 for the first week after that 2 daily  Dispense: 60 capsule; Refill: 0  7. GAD (generalized anxiety disorder)  - DULoxetine (CYMBALTA) 30 MG capsule; Take 1-2 capsules (30-60 mg total) by mouth daily. 1 for the first week after that 2 daily  Dispense: 60 capsule; Refill: 0  8. Diabetes mellitus type 2 in obese (HCC)  - POCT HgB A1C  9. Dyslipidemia  - rosuvastatin (CRESTOR) 40 MG tablet; Take 1 tablet (40 mg total) by mouth every evening. In place of Atorvastatin  Dispense: 90 tablet; Refill: 1  10. Needs flu shot  - Flu vaccine HIGH DOSE PF  11. Positive colorectal cancer screening using Cologuard test  Explained again importance of seeing GI

## 2018-05-23 LAB — HEMOGLOBIN A1C
EAG (MMOL/L): 6.6 (calc)
HEMOGLOBIN A1C: 5.8 %{Hb} — AB (ref <5.7)
Mean Plasma Glucose: 120 (calc)

## 2018-05-24 LAB — CBC WITH DIFFERENTIAL/PLATELET
BASOS ABS: 51 {cells}/uL (ref 0–200)
BASOS PCT: 0.4 %
EOS ABS: 64 {cells}/uL (ref 15–500)
EOS PCT: 0.5 %
HCT: 34.5 % — ABNORMAL LOW (ref 35.0–45.0)
HEMOGLOBIN: 11.2 g/dL — AB (ref 11.7–15.5)
Lymphs Abs: 1562 cells/uL (ref 850–3900)
MCH: 27.3 pg (ref 27.0–33.0)
MCHC: 32.5 g/dL (ref 32.0–36.0)
MCV: 83.9 fL (ref 80.0–100.0)
MONOS PCT: 4.2 %
MPV: 9.9 fL (ref 7.5–12.5)
NEUTROS ABS: 10490 {cells}/uL — AB (ref 1500–7800)
Neutrophils Relative %: 82.6 %
PLATELETS: 429 10*3/uL — AB (ref 140–400)
RBC: 4.11 10*6/uL (ref 3.80–5.10)
RDW: 13.9 % (ref 11.0–15.0)
TOTAL LYMPHOCYTE: 12.3 %
WBC mixed population: 533 cells/uL (ref 200–950)
WBC: 12.7 10*3/uL — ABNORMAL HIGH (ref 3.8–10.8)

## 2018-05-24 LAB — COMPLETE METABOLIC PANEL WITH GFR
AG Ratio: 1.1 (calc) (ref 1.0–2.5)
ALBUMIN MSPROF: 3.7 g/dL (ref 3.6–5.1)
ALKALINE PHOSPHATASE (APISO): 91 U/L (ref 33–130)
ALT: 7 U/L (ref 6–29)
AST: 11 U/L (ref 10–35)
BILIRUBIN TOTAL: 0.5 mg/dL (ref 0.2–1.2)
BUN / CREAT RATIO: 20 (calc) (ref 6–22)
BUN: 25 mg/dL (ref 7–25)
CHLORIDE: 98 mmol/L (ref 98–110)
CO2: 29 mmol/L (ref 20–32)
Calcium: 10.1 mg/dL (ref 8.6–10.4)
Creat: 1.23 mg/dL — ABNORMAL HIGH (ref 0.50–0.99)
GFR, EST AFRICAN AMERICAN: 52 mL/min/{1.73_m2} — AB (ref >=60)
GFR, Est Non African American: 45 mL/min/{1.73_m2} — ABNORMAL LOW (ref >=60)
GLUCOSE: 91 mg/dL (ref 65–99)
Globulin: 3.4 g/dL (calc) (ref 1.9–3.7)
POTASSIUM: 4.5 mmol/L (ref 3.5–5.3)
SODIUM: 137 mmol/L (ref 135–146)
Total Protein: 7.1 g/dL (ref 6.1–8.1)

## 2018-05-24 LAB — IRON,TIBC AND FERRITIN PANEL
%SAT: 12 % (calc) — ABNORMAL LOW (ref 16–45)
Ferritin: 216 ng/mL (ref 16–288)
IRON: 32 ug/dL — AB (ref 45–160)
TIBC: 267 mcg/dL (calc) (ref 250–450)

## 2018-05-24 LAB — ANA,IFA RA DIAG PNL W/RFLX TIT/PATN
ANA: NEGATIVE
RHEUMATOID FACTOR: 421 [IU]/mL — AB (ref <14)

## 2018-05-24 LAB — SEDIMENTATION RATE: Sed Rate: 103 mm/h — ABNORMAL HIGH (ref 0–30)

## 2018-05-24 LAB — TEST AUTHORIZATION

## 2018-05-24 LAB — C-REACTIVE PROTEIN: CRP: 59.6 mg/L — ABNORMAL HIGH (ref <8.0)

## 2018-05-24 LAB — TSH: TSH: 3.02 mIU/L (ref 0.40–4.50)

## 2018-05-25 ENCOUNTER — Other Ambulatory Visit: Payer: Self-pay | Admitting: Family Medicine

## 2018-05-25 DIAGNOSIS — D649 Anemia, unspecified: Secondary | ICD-10-CM

## 2018-05-25 DIAGNOSIS — D72829 Elevated white blood cell count, unspecified: Secondary | ICD-10-CM

## 2018-06-13 DIAGNOSIS — M170 Bilateral primary osteoarthritis of knee: Secondary | ICD-10-CM | POA: Diagnosis not present

## 2018-06-13 DIAGNOSIS — E669 Obesity, unspecified: Secondary | ICD-10-CM | POA: Diagnosis not present

## 2018-06-13 DIAGNOSIS — M059 Rheumatoid arthritis with rheumatoid factor, unspecified: Secondary | ICD-10-CM | POA: Diagnosis not present

## 2018-06-13 DIAGNOSIS — Z79899 Other long term (current) drug therapy: Secondary | ICD-10-CM | POA: Diagnosis not present

## 2018-06-13 DIAGNOSIS — M659 Synovitis and tenosynovitis, unspecified: Secondary | ICD-10-CM | POA: Diagnosis not present

## 2018-06-19 ENCOUNTER — Other Ambulatory Visit: Payer: Self-pay | Admitting: Family Medicine

## 2018-06-19 DIAGNOSIS — F411 Generalized anxiety disorder: Secondary | ICD-10-CM

## 2018-06-19 DIAGNOSIS — F320 Major depressive disorder, single episode, mild: Secondary | ICD-10-CM

## 2018-07-02 DIAGNOSIS — Z1231 Encounter for screening mammogram for malignant neoplasm of breast: Secondary | ICD-10-CM | POA: Diagnosis not present

## 2018-07-02 DIAGNOSIS — Z9289 Personal history of other medical treatment: Secondary | ICD-10-CM | POA: Diagnosis not present

## 2018-07-02 LAB — HM MAMMOGRAPHY

## 2018-07-11 DIAGNOSIS — M170 Bilateral primary osteoarthritis of knee: Secondary | ICD-10-CM | POA: Diagnosis not present

## 2018-07-11 DIAGNOSIS — M059 Rheumatoid arthritis with rheumatoid factor, unspecified: Secondary | ICD-10-CM | POA: Diagnosis not present

## 2018-07-11 DIAGNOSIS — Z79899 Other long term (current) drug therapy: Secondary | ICD-10-CM | POA: Diagnosis not present

## 2018-07-11 DIAGNOSIS — E669 Obesity, unspecified: Secondary | ICD-10-CM | POA: Diagnosis not present

## 2018-07-18 ENCOUNTER — Other Ambulatory Visit: Payer: Self-pay | Admitting: Family Medicine

## 2018-07-18 DIAGNOSIS — F320 Major depressive disorder, single episode, mild: Secondary | ICD-10-CM

## 2018-07-18 DIAGNOSIS — F411 Generalized anxiety disorder: Secondary | ICD-10-CM

## 2018-07-18 NOTE — Telephone Encounter (Signed)
Refill request for general medication. Cymbalta 60 mg to [facility].   Last office visit: 05/22/2018   Follow up 08/22/2018

## 2018-07-18 NOTE — Telephone Encounter (Signed)
Is she taking 30 mg or 60 ?

## 2018-07-20 NOTE — Telephone Encounter (Signed)
Patient states she is taking the Cymbalta 60 mg daily.

## 2018-08-21 ENCOUNTER — Other Ambulatory Visit: Payer: Self-pay

## 2018-08-22 ENCOUNTER — Encounter: Payer: Self-pay | Admitting: Family Medicine

## 2018-08-22 ENCOUNTER — Other Ambulatory Visit: Payer: Self-pay

## 2018-08-22 ENCOUNTER — Ambulatory Visit (INDEPENDENT_AMBULATORY_CARE_PROVIDER_SITE_OTHER): Payer: Medicare Other | Admitting: Family Medicine

## 2018-08-22 VITALS — BP 124/76 | HR 70 | Temp 98.2°F | Resp 14 | Ht 61.0 in | Wt 191.1 lb

## 2018-08-22 DIAGNOSIS — K635 Polyp of colon: Secondary | ICD-10-CM | POA: Insufficient documentation

## 2018-08-22 DIAGNOSIS — E669 Obesity, unspecified: Secondary | ICD-10-CM

## 2018-08-22 DIAGNOSIS — F411 Generalized anxiety disorder: Secondary | ICD-10-CM | POA: Diagnosis not present

## 2018-08-22 DIAGNOSIS — E785 Hyperlipidemia, unspecified: Secondary | ICD-10-CM

## 2018-08-22 DIAGNOSIS — E1169 Type 2 diabetes mellitus with other specified complication: Secondary | ICD-10-CM | POA: Diagnosis not present

## 2018-08-22 DIAGNOSIS — D72829 Elevated white blood cell count, unspecified: Secondary | ICD-10-CM

## 2018-08-22 DIAGNOSIS — M059 Rheumatoid arthritis with rheumatoid factor, unspecified: Secondary | ICD-10-CM

## 2018-08-22 DIAGNOSIS — K219 Gastro-esophageal reflux disease without esophagitis: Secondary | ICD-10-CM | POA: Diagnosis not present

## 2018-08-22 DIAGNOSIS — I10 Essential (primary) hypertension: Secondary | ICD-10-CM | POA: Diagnosis not present

## 2018-08-22 DIAGNOSIS — R7989 Other specified abnormal findings of blood chemistry: Secondary | ICD-10-CM

## 2018-08-22 DIAGNOSIS — R195 Other fecal abnormalities: Secondary | ICD-10-CM | POA: Diagnosis not present

## 2018-08-22 DIAGNOSIS — D125 Benign neoplasm of sigmoid colon: Secondary | ICD-10-CM

## 2018-08-22 DIAGNOSIS — Z79899 Other long term (current) drug therapy: Secondary | ICD-10-CM

## 2018-08-22 DIAGNOSIS — D84821 Immunodeficiency due to drugs: Secondary | ICD-10-CM | POA: Insufficient documentation

## 2018-08-22 DIAGNOSIS — D649 Anemia, unspecified: Secondary | ICD-10-CM | POA: Diagnosis not present

## 2018-08-22 MED ORDER — OLMESARTAN MEDOXOMIL-HCTZ 20-12.5 MG PO TABS: 1.0000 | ORAL_TABLET | Freq: Every day | ORAL | 1 refills | Status: DC

## 2018-08-22 MED ORDER — METOPROLOL TARTRATE 100 MG PO TABS: 100.0000 mg | ORAL_TABLET | Freq: Two times a day (BID) | ORAL | 1 refills | Status: DC

## 2018-08-22 MED ORDER — ROSUVASTATIN CALCIUM 40 MG PO TABS: 40.0000 mg | ORAL_TABLET | Freq: Every evening | ORAL | 1 refills | Status: DC

## 2018-08-22 NOTE — Patient Instructions (Signed)
Please have a diabetic eye exam ASAP

## 2018-08-22 NOTE — Progress Notes (Signed)
Name: Hayley Simmons   MRN: [id number]    DOB: 08-07-1946   Date:08/22/2018       Progress Note  Subjective  Chief Complaint  Chief Complaint  Patient presents with  . Follow-up    HPI  Prediabetes and Obesity: diagnosed July 2018, she has never been on medication. Denies polyphagia, polydipsia or polyuria. She is due for eye exam. Urine Micro is UTD.  Positive cologuard with history weight loss - she has gained some weight back since last visit, however I explained that she must see GI for possible colorectal cancer - she has not gone yet, wants to wait until the new year. She declines new referral today.  Morbid obesity: she has gained 4lbs back since last visit when she was down an additional 19lbs.  Again explained significant concern for possible colorectal cancer.  She is not exercising regularly due to recent issues with pain/RA diagnosis.  She does not follow a particularly healthy diet.  Elevated TSH: hx subclinical hypothyroidism - last check was normal 3 mos ago, denies palpitations, is no longer losing weight at a rapid pace as she seemed to be at her last visit, denies hair/skin/nail changes or constiaption.  We will check at next visit.  Dyslipidemia: on Crestor now instead of Lipitor, denies side effects of medication - no myalgias, chest pain, or shortness of breath. Last LDL was at goal in May 2019.  HTN: bp is at goal today. Taking Lopressor 100mg  BID and Benicar 20-12.5. No chest pain or palpitation, no vision changes, no BLE edema, lightheadedness or dizziness.  GERD: doing better, taking omeprazole only when needed; discussed long term risk of PPI, such as dementia, osteoporosis, colitis and heart disease. Advised she see GI for colonoscopy.  Major Depression/ Anxiety: going on for years, struggles with relationship with her daughter, and it makes her sad.  She recently lost her brother on 08/03/2018 - he had cancer, but was doing well, then passed suddenly.   She is off of Xanax now.  She is now taking Cymbalta and PHQ-9 score is much improved.  She says having less physical pain has greatly improved her mood.  Rheumatoid Arthritis: Arhtralgias started after left knee replacement 3 months ago, aching and swelling on both wrists, ankles, hands, and top of her feet. No redness or increase in warmth.  She had elevated RF at last visit in September 2019 - saw rheumatology and was given prednisone and methotrexate (taking this on Mondays).  She has routine laboratory follow up now.  She feels a lot better now - still having some joint pain but not nearly as bad as before - mostly has hand stiffness in the mornings.  Patient Active Problem List   Diagnosis Date Noted  . Total knee replacement status, left 02/19/2018  . Chronic kidney disease, stage III (moderate) ([place]) 01/23/2018  . MI (mitral incompetence) 01/18/2018  . Diabetes mellitus type 2 in obese ([place]) 06/21/2016  . Elevated TSH 05/19/2016  . Osteoarthritis of both knees 06/24/2015  . Female stress incontinence 06/24/2015  . Hypertension 05/19/2015  . GERD (gastroesophageal reflux disease) 05/19/2015  . Hyperlipidemia 05/19/2015  . At risk for falling 05/19/2015  . Obesity, morbid ([place]) 05/19/2015    Past Surgical History:  Procedure Laterality Date  . ABDOMINAL HYSTERECTOMY  1994  . CARPAL TUNNEL RELEASE Right 07/29/2008  . FOOT SURGERY Right    bunion  . TOTAL KNEE ARTHROPLASTY Left 02/19/2018   Procedure: TOTAL KNEE ARTHROPLASTY;  Surgeon: Harlow Mares,  Elyn Aquas, MD;  Location: [facility] ORS;  Service: Orthopedics;  Laterality: Left;    Family History  Problem Relation Age of Onset  . Gout Mother   . Dementia Mother   . Kidney disease Mother   . Cancer Mother        kidney  . Heart attack Mother   . Heart attack Father   . Cancer Brother     Social History   Socioeconomic History  . Marital status: Married    Spouse name: Neldon Newport   . Number of children: 1  . Years of  education: Not on file  . Highest education level: 12th grade  Occupational History  . 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 Financial resource strain: Not on file  . Food insecurity:    Worry: Not on file    Inability: Not on file  . Transportation needs:    Medical: Not on file    Non-medical: Not on file  Tobacco Use  . Smoking status: Former Smoker    Packs/day: 0.25    Years: 31.00    Pack years: 7.75    Types: Cigarettes    Last attempt to quit: 11/18/1997    Years since quitting: 20.7  . Smokeless tobacco: Never Used  . Tobacco comment: quit in 11/1997  Substance and Sexual Activity  . Alcohol use: No    Alcohol/week: 0.0 standard drinks  . Drug use: No  . Sexual activity: Not Currently  Lifestyle  . Physical activity:    Days per week: Not on file    Minutes per session: Not on file  . Stress: Not on file  Relationships  . Social connections:    Talks on phone: Not on file    Gets together: Not on file    Attends religious service: Not on file    Active member of club or organization: Not on file    Attends meetings of clubs or organizations: Not on file    Relationship status: Not on file  . Intimate partner violence:    Fear of current or ex partner: Not on file    Emotionally abused: Not on file    Physically abused: Not on file    Forced sexual activity: Not on file  Other Topics Concern  . Not on file  Social History Narrative  . Not on file     Current Outpatient Medications:  .  acetaminophen (TYLENOL) 500 MG tablet, Take 1,000 mg by mouth every 6 (six) hours as needed (for pain/headaches.)., Disp: , Rfl:  .  aspirin 81 MG chewable tablet, Chew 1 tablet (81 mg total) by mouth 2 (two) times daily., Disp: 30 tablet, Rfl: 0 .  Calcium Carb-Cholecalciferol (CALCIUM 600+D3 PO), Take 1 tablet by mouth 2 (two) times daily., Disp: , Rfl:  .  diclofenac sodium (VOLTAREN) 1 % GEL,  Apply 1-2 g topically 4 (four) times daily as needed (for pain.)., Disp: , Rfl:  .  docusate sodium (COLACE) 100 MG capsule, Take 1 capsule (100 mg total) by mouth 2 (two) times daily., Disp: 10 capsule, Rfl: 0 .  DULoxetine (CYMBALTA) 60 MG capsule, TAKE 1 CAPSULE(60 MG) BY MOUTH DAILY, Disp: 90 capsule, Rfl: 0 .  fluticasone (FLONASE) 50 MCG/ACT nasal spray, Place 1-2 sprays into both nostrils daily as needed for allergies., Disp: , Rfl:  .  loratadine (CLARITIN) 10 MG tablet, as  needed., Disp: , Rfl:  .  metoprolol tartrate (LOPRESSOR) 100 MG tablet, Take 1 tablet (100 mg total) by mouth 2 (two) times daily., Disp: 180 tablet, Rfl: 1 .  olmesartan-hydrochlorothiazide (BENICAR HCT) 20-12.5 MG tablet, Take 1 tablet by mouth daily., Disp: 90 tablet, Rfl: 1 .  omeprazole (PRILOSEC) 20 MG capsule, Take 1 capsule (20 mg total) by mouth daily. (Patient taking differently: Take 20 mg by mouth every other day. MORNING.), Disp: 90 capsule, Rfl: 0 .  rosuvastatin (CRESTOR) 40 MG tablet, Take 1 tablet (40 mg total) by mouth every evening. In place of Atorvastatin, Disp: 90 tablet, Rfl: 1 .  traMADol (ULTRAM) 50 MG tablet, Take 50 mg by mouth daily., Disp: , Rfl:   Allergies  Allergen Reactions  . Caduet  [Amlodipine-Atorvastatin] Other (See Comments)    Makes Pt feel bad makes pt feel bad  . Cyclosporine Other (See Comments), Nausea And Vomiting and Nausea Only  . Escitalopram Other (See Comments)    I personally reviewed active problem list, medication list, allergies, notes from last encounter, lab results with the patient/caregiver today.   ROS  Constitutional: Negative for fever or weight change.  Respiratory: Negative for cough and shortness of breath.   Cardiovascular: Negative for chest pain or palpitations.  Gastrointestinal: Negative for abdominal pain, no bowel changes.  Musculoskeletal: Negative for gait problem or joint swelling.  Skin: Negative for rash.  Neurological: Negative for  dizziness or headache.  No other specific complaints in a complete review of systems (except as listed in HPI above).  Objective  Vitals:   08/22/18 0913  BP: 124/76  Pulse: 70  Resp: 14  Temp: 98.2 F (36.8 C)  TempSrc: Oral  SpO2: 99%  Weight: 191 lb 1.6 oz (86.7 kg)  Height: 5\' 1"  (1.549 m)   Body mass index is 36.11 kg/m.  Physical Exam  Constitutional: Patient appears well-developed and well-nourished. No distress.  HENT: Head: Normocephalic and atraumatic. Ears: bilateral TMs with no erythema or effusion; Nose: Nose normal. Mouth/Throat: Oropharynx is clear and moist. Eyes: Conjunctivae and EOM are normal. No scleral icterus.   Neck: Normal range of motion. Neck supple. No JVD present. No thyromegaly present.  Cardiovascular: Normal rate, regular rhythm and normal heart sounds.  No murmur heard. No BLE edema. Pulmonary/Chest: Effort normal and breath sounds normal. No respiratory distress. Musculoskeletal: Normal range of motion, no joint effusions. No gross deformities Neurological: Pt is alert and oriented to person, place, and time. No cranial nerve deficit. Coordination, balance, strength, speech and gait are normal.  Skin: Skin is warm and dry. No rash noted. No erythema.  Psychiatric: Patient has a normal mood and affect. behavior is normal. Judgment and thought content normal.  No results found for this or any previous visit (from the past 72 hour(s)).  PHQ2/9: Depression screen [facility] 2/9 08/22/2018 05/22/2018 01/18/2018 10/13/2017 07/13/2017  Decreased Interest 1 2 1  0 0  Down, Depressed, Hopeless 1 0 0 0 0  PHQ - 2 Score 2 2 1  0 0  Altered sleeping 0 2 2 - -  Tired, decreased energy 0 3 1 - -  Change in appetite 0 2 1 - -  Feeling bad or failure about yourself  1 2 0 - -  Trouble concentrating 0 0 0 - -  Moving slowly or fidgety/restless 0 1 0 - -  Suicidal thoughts 0 0 0 - -  PHQ-9 Score 3 12 5  - -  Difficult doing work/chores Not difficult at all  Not difficult  at all Somewhat difficult - -   Fall Risk: Fall Risk  08/22/2018 01/18/2018 10/13/2017 07/13/2017 04/11/2017  Falls in the past year? 0 No No No No  Number falls in past yr: 0 - - - -  Injury with Fall? 0 - - - -   Assessment & Plan  1. Diabetes mellitus type 2 in obese (HCC) - Stable, diet controlled  2. Positive colorectal cancer screening using Cologuard test - Needs to see GI ASAP - she declines until January 2020. Explained increased risk of cancer and need for quick action, she verbalizes understandign but is adamant to wait until January.  3. Obesity, morbid ([facility]) - Discussed importance of 150 minutes of physical activity weekly, eat two servings of fish weekly, eat one serving of tree nuts ( cashews, pistachios, pecans, almonds.Marland Kitchen) every other day, eat 6 servings of fruit/vegetables daily and drink plenty of water and avoid sweet beverages.   4. Elevated TSH - Stable at last check, will check at next visit.  6. Essential hypertension - olmesartan-hydrochlorothiazide (BENICAR HCT) 20-12.5 MG tablet; Take 1 tablet by mouth daily.  Dispense: 90 tablet; Refill: 1 - metoprolol tartrate (LOPRESSOR) 100 MG tablet; Take 1 tablet (100 mg total) by mouth 2 (two) times daily.  Dispense: 180 tablet; Refill: 1 - BMP per orders  7. Seropositive rheumatoid arthritis (HCC) - Seeing Dr. Meda Coffee - keep follow up - Advised she is considered immunocompromised while taking methotrexate and she is at higher risk for complicated infections; encouraged excellent hand hygiene, especially during flu and cold season.  8. Dyslipidemia - rosuvastatin (CRESTOR) 40 MG tablet; Take 1 tablet (40 mg total) by mouth every evening. In place of Atorvastatin  Dispense: 90 tablet; Refill: 1  9. GAD (generalized anxiety disorder) - Stable on Cymbalta  10. Gastroesophageal reflux disease, esophagitis presence not specified - Taking omeprazole PRN

## 2018-08-23 LAB — CBC WITH DIFFERENTIAL/PLATELET
BASOS ABS: 49 {cells}/uL (ref 0–200)
BASOS PCT: 0.5 %
EOS ABS: 137 {cells}/uL (ref 15–500)
Eosinophils Relative: 1.4 %
HCT: 37.6 % (ref 35.0–45.0)
Hemoglobin: 12.5 g/dL (ref 11.7–15.5)
Lymphs Abs: 1617 cells/uL (ref 850–3900)
MCH: 29.8 pg (ref 27.0–33.0)
MCHC: 33.2 g/dL (ref 32.0–36.0)
MCV: 89.5 fL (ref 80.0–100.0)
MONOS PCT: 3.4 %
MPV: 9.6 fL (ref 7.5–12.5)
NEUTROS PCT: 78.2 %
Neutro Abs: 7664 cells/uL (ref 1500–7800)
Platelets: 306 10*3/uL (ref 140–400)
RBC: 4.2 10*6/uL (ref 3.80–5.10)
RDW: 15.4 % — ABNORMAL HIGH (ref 11.0–15.0)
TOTAL LYMPHOCYTE: 16.5 %
WBC: 9.8 10*3/uL (ref 3.8–10.8)
WBCMIX: 333 {cells}/uL (ref 200–950)

## 2018-08-23 LAB — BASIC METABOLIC PANEL
BUN: 21 mg/dL (ref 7–25)
CALCIUM: 9.6 mg/dL (ref 8.6–10.4)
CO2: 30 mmol/L (ref 20–32)
Chloride: 101 mmol/L (ref 98–110)
Creat: 0.87 mg/dL (ref 0.60–0.93)
Glucose, Bld: 89 mg/dL (ref 65–99)
POTASSIUM: 4.3 mmol/L (ref 3.5–5.3)
SODIUM: 140 mmol/L (ref 135–146)

## 2018-08-23 LAB — IRON,TIBC AND FERRITIN PANEL
%SAT: 27 % (ref 16–45)
Ferritin: 103 ng/mL (ref 16–288)
IRON: 83 ug/dL (ref 45–160)
TIBC: 305 ug/dL (ref 250–450)

## 2018-09-16 ENCOUNTER — Other Ambulatory Visit: Payer: Self-pay | Admitting: Family Medicine

## 2018-09-16 DIAGNOSIS — K219 Gastro-esophageal reflux disease without esophagitis: Secondary | ICD-10-CM

## 2018-09-20 ENCOUNTER — Telehealth: Payer: Self-pay | Admitting: Family Medicine

## 2018-09-20 NOTE — Telephone Encounter (Signed)
Please call the patient and remind her that she needs to see GI - I know she wanted to wait until after the new year.  She had positive cologuard testing and absolutely has to see them for colonoscopy.  If she needs referral, please place or send to me to place.

## 2018-09-21 NOTE — Telephone Encounter (Signed)
Called and left a voicemail for the patient to call and schedule GI appointment. If she needs a referral to call us back and let us know.

## 2018-09-24 ENCOUNTER — Telehealth: Payer: Self-pay

## 2018-09-24 NOTE — Telephone Encounter (Signed)
Pt  Is calling to schedule a colonoscopy

## 2018-09-25 ENCOUNTER — Other Ambulatory Visit: Payer: Self-pay

## 2018-09-25 DIAGNOSIS — R195 Other fecal abnormalities: Secondary | ICD-10-CM

## 2018-09-25 NOTE — Telephone Encounter (Signed)
Returned patients call.  She has been scheduled colonoscopy with Dr. Marius Ditch at [facility] on 10/10/18.  Thanks [employer]

## 2018-10-03 ENCOUNTER — Encounter: Payer: Self-pay | Admitting: *Deleted

## 2018-10-03 ENCOUNTER — Other Ambulatory Visit: Payer: Self-pay

## 2018-10-03 NOTE — Anesthesia Preprocedure Evaluation (Addendum)
Anesthesia Evaluation  Patient identified by MRN, date of birth, ID band Patient awake    Reviewed: Allergy & Precautions, NPO status , Patient's Chart, lab work & pertinent test results  History of Anesthesia Complications Negative for: history of anesthetic complications  Airway Mallampati: II   Neck ROM: Full    Dental  (+)    Pulmonary former smoker (quit 1999),    Pulmonary exam normal breath sounds clear to auscultation       Cardiovascular Exercise Tolerance: Good hypertension, Normal cardiovascular exam Rhythm:Regular Rate:Normal     Neuro/Psych PSYCHIATRIC DISORDERS Anxiety Depression negative neurological ROS     GI/Hepatic GERD  ,  Endo/Other  negative endocrine ROS  Renal/GU negative Renal ROS     Musculoskeletal  (+) Arthritis , Osteoarthritis and Rheumatoid disorders,    Abdominal   Peds  Hematology negative hematology ROS (+)   Anesthesia Other Findings   Reproductive/Obstetrics                            Anesthesia Physical Anesthesia Plan  ASA: II  Anesthesia Plan: General   Post-op Pain Management:    Induction: Intravenous  PONV Risk Score and Plan: 3 and Propofol infusion and TIVA  Airway Management Planned: Natural Airway  Additional Equipment:   Intra-op Plan:   Post-operative Plan:   Informed Consent: I have reviewed the patients History and Physical, chart, labs and discussed the procedure including the risks, benefits and alternatives for the proposed anesthesia with the patient or authorized representative who has indicated his/her understanding and acceptance.       Plan Discussed with: CRNA  Anesthesia Plan Comments:        Anesthesia Quick Evaluation

## 2018-10-09 NOTE — Discharge Instructions (Signed)
General Anesthesia, Adult, Care After  This sheet gives you information about how to care for yourself after your procedure. Your health care provider may also give you more specific instructions. If you have problems or questions, contact your health care provider.  What can I expect after the procedure?  After the procedure, the following side effects are common:  Pain or discomfort at the IV site.  Nausea.  Vomiting.  Sore throat.  Trouble concentrating.  Feeling cold or chills.  Weak or tired.  Sleepiness and fatigue.  Soreness and body aches. These side effects can affect parts of the body that were not involved in surgery.  Follow these instructions at home:    For at least 24 hours after the procedure:  Have a responsible adult stay with you. It is important to have someone help care for you until you are awake and alert.  Rest as needed.  Do not:  Participate in activities in which you could fall or become injured.  Drive.  Use heavy machinery.  Drink alcohol.  Take sleeping pills or medicines that cause drowsiness.  Make important decisions or sign legal documents.  Take care of children on your own.  Eating and drinking  Follow any instructions from your health care provider about eating or drinking restrictions.  When you feel hungry, start by eating small amounts of foods that are soft and easy to digest (bland), such as toast. Gradually return to your regular diet.  Drink enough fluid to keep your urine pale yellow.  If you vomit, rehydrate by drinking water, juice, or clear broth.  General instructions  If you have sleep apnea, surgery and certain medicines can increase your risk for breathing problems. Follow instructions from your health care provider about wearing your sleep device:  Anytime you are sleeping, including during daytime naps.  While taking prescription pain medicines, sleeping medicines, or medicines that make you drowsy.  Return to your normal activities as told by your health care  provider. Ask your health care provider what activities are safe for you.  Take over-the-counter and prescription medicines only as told by your health care provider.  If you smoke, do not smoke without supervision.  Keep all follow-up visits as told by your health care provider. This is important.  Contact a health care provider if:  You have nausea or vomiting that does not get better with medicine.  You cannot eat or drink without vomiting.  You have pain that does not get better with medicine.  You are unable to pass urine.  You develop a skin rash.  You have a fever.  You have redness around your IV site that gets worse.  Get help right away if:  You have difficulty breathing.  You have chest pain.  You have blood in your urine or stool, or you vomit blood.  Summary  After the procedure, it is common to have a sore throat or nausea. It is also common to feel tired.  Have a responsible adult stay with you for the first 24 hours after general anesthesia. It is important to have someone help care for you until you are awake and alert.  When you feel hungry, start by eating small amounts of foods that are soft and easy to digest (bland), such as toast. Gradually return to your regular diet.  Drink enough fluid to keep your urine pale yellow.  Return to your normal activities as told by your health care provider. Ask your health care   provider what activities are safe for you.  This information is not intended to replace advice given to you by your health care provider. Make sure you discuss any questions you have with your health care provider.  Document Released: 12/12/2000 Document Revised: 04/21/2017 Document Reviewed: 04/21/2017  Elsevier Interactive Patient Education  2019 [employer]

## 2018-10-10 ENCOUNTER — Ambulatory Visit
Admission: RE | Admit: 2018-10-10 | Discharge: 2018-10-10 | Disposition: A | Discharge status: Home / Self Care | Payer: Medicare Other | Attending: Gastroenterology | Admitting: Gastroenterology

## 2018-10-10 ENCOUNTER — Ambulatory Visit: Payer: Medicare Other | Admitting: Anesthesiology

## 2018-10-10 ENCOUNTER — Encounter
Admission: RE | Disposition: A | Discharge status: Home / Self Care | Payer: Self-pay | Source: Home / Self Care | Attending: Gastroenterology

## 2018-10-10 DIAGNOSIS — I10 Essential (primary) hypertension: Secondary | ICD-10-CM | POA: Insufficient documentation

## 2018-10-10 DIAGNOSIS — K621 Rectal polyp: Secondary | ICD-10-CM | POA: Diagnosis not present

## 2018-10-10 DIAGNOSIS — Z79899 Other long term (current) drug therapy: Secondary | ICD-10-CM | POA: Insufficient documentation

## 2018-10-10 DIAGNOSIS — D125 Benign neoplasm of sigmoid colon: Secondary | ICD-10-CM | POA: Diagnosis not present

## 2018-10-10 DIAGNOSIS — F329 Major depressive disorder, single episode, unspecified: Secondary | ICD-10-CM | POA: Insufficient documentation

## 2018-10-10 DIAGNOSIS — M069 Rheumatoid arthritis, unspecified: Secondary | ICD-10-CM | POA: Diagnosis not present

## 2018-10-10 DIAGNOSIS — D124 Benign neoplasm of descending colon: Secondary | ICD-10-CM | POA: Diagnosis not present

## 2018-10-10 DIAGNOSIS — K644 Residual hemorrhoidal skin tags: Secondary | ICD-10-CM | POA: Diagnosis not present

## 2018-10-10 DIAGNOSIS — Z7982 Long term (current) use of aspirin: Secondary | ICD-10-CM | POA: Diagnosis not present

## 2018-10-10 DIAGNOSIS — K219 Gastro-esophageal reflux disease without esophagitis: Secondary | ICD-10-CM | POA: Diagnosis not present

## 2018-10-10 DIAGNOSIS — F419 Anxiety disorder, unspecified: Secondary | ICD-10-CM | POA: Diagnosis not present

## 2018-10-10 DIAGNOSIS — E785 Hyperlipidemia, unspecified: Secondary | ICD-10-CM | POA: Insufficient documentation

## 2018-10-10 DIAGNOSIS — K5730 Diverticulosis of large intestine without perforation or abscess without bleeding: Secondary | ICD-10-CM | POA: Diagnosis not present

## 2018-10-10 DIAGNOSIS — D122 Benign neoplasm of ascending colon: Secondary | ICD-10-CM | POA: Diagnosis not present

## 2018-10-10 DIAGNOSIS — Z87891 Personal history of nicotine dependence: Secondary | ICD-10-CM | POA: Diagnosis not present

## 2018-10-10 DIAGNOSIS — R195 Other fecal abnormalities: Secondary | ICD-10-CM | POA: Diagnosis not present

## 2018-10-10 DIAGNOSIS — K635 Polyp of colon: Secondary | ICD-10-CM | POA: Diagnosis not present

## 2018-10-10 HISTORY — PX: POLYPECTOMY: SHX5525

## 2018-10-10 HISTORY — PX: COLONOSCOPY WITH PROPOFOL: SHX5780

## 2018-10-10 HISTORY — DX: Rheumatoid arthritis, unspecified: M06.9

## 2018-10-10 SURGERY — COLONOSCOPY WITH PROPOFOL
Anesthesia: General | Site: Rectum

## 2018-10-10 MED ORDER — ONDANSETRON HCL 4 MG/2ML IJ SOLN
INTRAMUSCULAR | Status: DC | PRN
  Administered 2018-10-10: 4 mg via INTRAVENOUS

## 2018-10-10 MED ORDER — GLYCOPYRROLATE 0.2 MG/ML IJ SOLN
INTRAMUSCULAR | Status: DC | PRN
  Administered 2018-10-10: 0.2 mg via INTRAVENOUS

## 2018-10-10 MED ORDER — STERILE WATER FOR IRRIGATION IR SOLN
Status: DC | PRN
  Administered 2018-10-10: .05 mL

## 2018-10-10 MED ORDER — LACTATED RINGERS IV SOLN
INTRAVENOUS | Status: DC
  Administered 2018-10-10: 08:00:00 via INTRAVENOUS

## 2018-10-10 MED ORDER — SODIUM CHLORIDE 0.9 % IV SOLN: INTRAVENOUS | Status: DC

## 2018-10-10 MED ORDER — PROPOFOL 10 MG/ML IV BOLUS
INTRAVENOUS | Status: DC | PRN
  Administered 2018-10-10 (×5): 20 mg via INTRAVENOUS
  Administered 2018-10-10: 100 mg via INTRAVENOUS
  Administered 2018-10-10: 20 mg via INTRAVENOUS

## 2018-10-10 MED ORDER — LIDOCAINE HCL (CARDIAC) PF 100 MG/5ML IV SOSY
PREFILLED_SYRINGE | INTRAVENOUS | Status: DC | PRN
  Administered 2018-10-10: 30 mg via INTRAVENOUS

## 2018-10-10 SURGICAL SUPPLY — 9 items
CANISTER SUCT 1200ML W/VALVE (MISCELLANEOUS) ×4 IMPLANT
ELECT REM PT RETURN 9FT ADLT (ELECTROSURGICAL) ×4
ELECTRODE REM PT RTRN 9FT ADLT (ELECTROSURGICAL) ×2 IMPLANT
GOWN CVR UNV OPN BCK APRN NK (MISCELLANEOUS) ×4 IMPLANT
GOWN ISOL THUMB LOOP REG UNIV (MISCELLANEOUS) ×4
KIT ENDO PROCEDURE OLY (KITS) ×4 IMPLANT
SNARE COLD EXACTO (MISCELLANEOUS) ×4 IMPLANT
TRAP ETRAP POLY (MISCELLANEOUS) ×4 IMPLANT
WATER STERILE IRR 250ML POUR (IV SOLUTION) ×4 IMPLANT

## 2018-10-10 NOTE — Anesthesia Postprocedure Evaluation (Signed)
Anesthesia Post Note  Patient: [place]  Procedure(s) Performed: COLONOSCOPY WITH PROPOFOL (N/A ) POLYPECTOMY (Rectum)  Patient location during evaluation: PACU Anesthesia Type: General Level of consciousness: awake and alert, oriented and patient cooperative Pain management: pain level controlled Vital Signs Assessment: post-procedure vital signs reviewed and stable Respiratory status: spontaneous breathing, nonlabored ventilation and respiratory function stable Cardiovascular status: blood pressure returned to baseline and stable Postop Assessment: adequate PO intake Anesthetic complications: no    Darrin Nipper

## 2018-10-10 NOTE — Op Note (Signed)
[facility] Gastroenterology Patient Name: Hayley Simmons Procedure Date: 10/10/2018 9:27 AM MRN: [id number] Account #: [id number] Date of Birth: [birth year] Admit Type: Outpatient Age: 73 Room: [facility] OR ROOM 01 Gender: Female Note Status: Finalized Procedure:            Colonoscopy Indications:          This is the patient's first colonoscopy, Positive                        Cologuard test Providers:            Lin Landsman MD, MD Complications:        No immediate complications. Estimated blood loss: None. Procedure:            Pre-Anesthesia Assessment:                       - Prior to the procedure, a History and Physical was                        performed, and patient medications and allergies were                        reviewed. The patient is competent. The risks and                        benefits of the procedure and the sedation options and                        risks were discussed with the patient. All questions                        were answered and informed consent was obtained.                        Patient identification and proposed procedure were                        verified by the physician, the nurse, the                        anesthesiologist, the anesthetist and the technician in                        the pre-procedure area in the procedure room in the                        endoscopy suite. Mental Status Examination: alert and                        oriented. Airway Examination: normal oropharyngeal                        airway and neck mobility. Respiratory Examination:                        clear to auscultation. CV Examination: normal.                        Prophylactic Antibiotics: The patient does not require  prophylactic antibiotics. Prior Anticoagulants: The                        patient has taken no previous anticoagulant or                        antiplatelet agents. ASA Grade Assessment: III  - A                        patient with severe systemic disease. After reviewing                        the risks and benefits, the patient was deemed in                        satisfactory condition to undergo the procedure. The                        anesthesia plan was to use monitored anesthesia care                        (MAC). Immediately prior to administration of                        medications, the patient was re-assessed for adequacy                        to receive sedatives. The heart rate, respiratory rate,                        oxygen saturations, blood pressure, adequacy of                        pulmonary ventilation, and response to care were                        monitored throughout the procedure. The physical status                        of the patient was re-assessed after the procedure.                       After obtaining informed consent, the colonoscope was                        passed under direct vision. Throughout the procedure,                        the patient's blood pressure, pulse, and oxygen                        saturations were monitored continuously. The was                        introduced through the anus and advanced to the the                        terminal ileum, with identification of the appendiceal  orifice and IC valve. The colonoscopy was performed                        without difficulty. The patient tolerated the procedure                        well. The quality of the bowel preparation was                        evaluated using the BBPS [facility] Bowel Preparation                        Scale) with scores of: Right Colon = 3, Transverse                        Colon = 3 and Left Colon = 3 (entire mucosa seen well                        with no residual staining, small fragments of stool or                        opaque liquid). The total BBPS score equals 9. Findings:      The perianal and digital rectal  examinations were normal. Pertinent       negatives include normal sphincter tone and no palpable rectal lesions.      The terminal ileum appeared normal.      Three sessile polyps were found in the ascending colon. The polyps were       4 to 5 mm in size. These polyps were removed with a cold snare.       Resection and retrieval were complete.      A 8 mm polyp was found in the descending colon. The polyp was sessile.       The polyp was removed with a hot snare. Resection and retrieval were       complete.      A 5 mm polyp was found in the descending colon. The polyp was sessile.       The polyp was removed with a cold snare. Resection and retrieval were       complete.      Two semi-pedunculated polyps were found in the sigmoid colon. The polyps       were 7 to 9 mm in size. These polyps were removed with a hot snare.       Resection and retrieval were complete.      Two sessile polyps were found in the rectum. The polyps were diminutive       in size. These polyps were removed with a cold snare. Resection and       retrieval were complete.      Multiple diverticula were found in the sigmoid colon. There was no       evidence of diverticular bleeding.      External hemorrhoids were found during retroflexion. The hemorrhoids       were medium-sized. Impression:           - The examined portion of the ileum was normal.                       - Three 4 to 5 mm polyps in the ascending colon,  removed with a cold snare. Resected and retrieved.                       - One 8 mm polyp in the descending colon, removed with                        a hot snare. Resected and retrieved.                       - One 5 mm polyp in the descending colon, removed with                        a cold snare. Resected and retrieved.                       - Two 7 to 9 mm polyps in the sigmoid colon, removed                        with a hot snare. Resected and retrieved.                        - Two diminutive polyps in the rectum, removed with a                        cold snare. Resected and retrieved.                       - Severe diverticulosis in the sigmoid colon. There was                        no evidence of diverticular bleeding.                       - External hemorrhoids. Recommendation:       - Discharge patient to home (with escort).                       - Resume previous diet today.                       - Continue present medications.                       - Await pathology results.                       - Repeat colonoscopy in 3 years for surveillance of                        multiple polyps. Procedure Code(s):    --- Professional ---                       [phone], Colonoscopy, flexible; with removal of tumor(s),                        polyp(s), or other lesion(s) by snare technique Diagnosis Code(s):    --- Professional ---                       K64.4, Residual hemorrhoidal skin tags  D12.2, Benign neoplasm of ascending colon                       D12.5, Benign neoplasm of sigmoid colon                       K62.1, Rectal polyp                       D12.4, Benign neoplasm of descending colon                       R19.5, Other fecal abnormalities                       K57.30, Diverticulosis of large intestine without                        perforation or abscess without bleeding CPT copyright 2018 American Medical Association. All rights reserved. The codes documented in this report are preliminary and upon coder review may  be revised to meet current compliance requirements. Dr. Ulyess Mort Lin Landsman MD, MD 10/10/2018 10:13:26 AM This report has been signed electronically. Number of Addenda: 0 Note Initiated On: 10/10/2018 9:27 AM Scope Withdrawal Time: 0 hours 25 minutes 6 seconds  Total Procedure Duration: 0 hours 29 minutes 0 seconds       [facility]

## 2018-10-10 NOTE — H&P (Signed)
Hayley Darby, Hayley Simmons [address]  [place]  [place], [place] [address]  Main: [phone]  Fax: [phone] Pager: [phone]  Primary Care Physician:  Hubbard Hartshorn, FNP Primary Gastroenterologist:  Dr. Cephas Simmons  Pre-Procedure History & Physical: HPI:  Hayley Simmons is a 73 y.o. female is here for an colonoscopy.   Past Medical History:  Diagnosis Date  . Allergy   . Anxiety   . GERD (gastroesophageal reflux disease)   . Hyperlipidemia   . Hypertension   . Osteoarthritis   . Rheumatoid arthritis ([place])   . Shingles     Past Surgical History:  Procedure Laterality Date  . ABDOMINAL HYSTERECTOMY  1994  . CARPAL TUNNEL RELEASE Right 07/29/2008  . FOOT SURGERY Right    bunion  . TOTAL KNEE ARTHROPLASTY Left 02/19/2018   Procedure: TOTAL KNEE ARTHROPLASTY;  Surgeon: Lovell Sheehan, Hayley Simmons;  Location: [facility] ORS;  Service: Orthopedics;  Laterality: Left;    Prior to Admission medications   Medication Sig Start Date End Date Taking? Authorizing Provider  aspirin 81 MG chewable tablet Chew 1 tablet (81 mg total) by mouth 2 (two) times daily. 02/21/18  Yes Lovell Sheehan, Hayley Simmons  Aspirin-Acetaminophen-Caffeine (EXCEDRIN PO) Take by mouth as needed.   Yes Provider, Historical, Hayley Simmons  Calcium Carb-Cholecalciferol (CALCIUM 600+D3 PO) Take 1 tablet by mouth 2 (two) times daily.   Yes Provider, Historical, Hayley Simmons  diclofenac sodium (VOLTAREN) 1 % GEL Apply 1-2 g topically 4 (four) times daily as needed (for pain.).   Yes Provider, Historical, Hayley Simmons  DULoxetine (CYMBALTA) 60 MG capsule TAKE 1 CAPSULE(60 MG) BY MOUTH DAILY 07/20/18  Yes Sowles, Drue Stager, Hayley Simmons  fluticasone (FLONASE) 50 MCG/ACT nasal spray Place 1-2 sprays into both nostrils daily as needed for allergies.   Yes Provider, Historical, Hayley Simmons  loratadine (CLARITIN) 10 MG tablet as needed.   Yes Provider, Historical, Hayley Simmons  methotrexate (RHEUMATREX) 2.5 MG tablet Take 17.5 mg by mouth once a week. Takes on Mondays    Yes Provider,  Historical, Hayley Simmons  metoprolol tartrate (LOPRESSOR) 100 MG tablet Take 1 tablet (100 mg total) by mouth 2 (two) times daily. 08/22/18  Yes Hubbard Hartshorn, FNP  olmesartan-hydrochlorothiazide (BENICAR HCT) 20-12.5 MG tablet Take 1 tablet by mouth daily. 08/22/18  Yes Hubbard Hartshorn, FNP  omeprazole (PRILOSEC) 20 MG capsule Take 1 capsule (20 mg total) by mouth every other day. MORNING. 09/16/18  Yes Sowles, Drue Stager, Hayley Simmons  rosuvastatin (CRESTOR) 40 MG tablet Take 1 tablet (40 mg total) by mouth every evening. In place of Atorvastatin 08/22/18  Yes Hubbard Hartshorn, FNP  docusate sodium (COLACE) 100 MG capsule Take 1 capsule (100 mg total) by mouth 2 (two) times daily. Patient not taking: Reported on 10/03/2018 02/21/18   Lovell Sheehan, Hayley Simmons    Allergies as of 09/25/2018 - Review Complete 08/22/2018  Allergen Reaction Noted  . Caduet  [amlodipine-atorvastatin] Other (See Comments) 05/19/2015  . Cyclosporine Other (See Comments), Nausea And Vomiting, and Nausea Only 05/19/2015  . Escitalopram Other (See Comments) 05/19/2015    Family History  Problem Relation Age of Onset  . Gout Mother   . Dementia Mother   . Kidney disease Mother   . Cancer Mother        kidney  . Heart attack Mother   . Heart attack Father   . Cancer Brother     Social History   Socioeconomic History  . Marital status: Married    Spouse name: Doren Custard  Alvester Chou   . Number of children: 1  . Years of education: Not on file  . Highest education level: 12th grade  Occupational History  . 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 Financial resource strain: Not on file  . Food insecurity:    Worry: Not on file    Inability: Not on file  . Transportation needs:    Medical: Not on file    Non-medical: Not on file  Tobacco Use  . Smoking status: Former Smoker    Packs/day: 0.25    Years: 31.00    Pack years: 7.75    Types: Cigarettes    Last attempt to  quit: 11/18/1997    Years since quitting: 20.9  . Smokeless tobacco: Never Used  . Tobacco comment: quit in 11/1997  Substance and Sexual Activity  . Alcohol use: No    Alcohol/week: 0.0 standard drinks  . Drug use: No  . Sexual activity: Not Currently  Lifestyle  . Physical activity:    Days per week: Not on file    Minutes per session: Not on file  . Stress: Not on file  Relationships  . Social connections:    Talks on phone: Not on file    Gets together: Not on file    Attends religious service: Not on file    Active member of club or organization: Not on file    Attends meetings of clubs or organizations: Not on file    Relationship status: Not on file  . Intimate partner violence:    Fear of current or ex partner: Not on file    Emotionally abused: Not on file    Physically abused: Not on file    Forced sexual activity: Not on file  Other Topics Concern  . Not on file  Social History Narrative  . Not on file    Review of Systems: See HPI, otherwise negative ROS  Physical Exam: BP (!) 155/82   Pulse 64   Temp (!) 97.5 F (36.4 C) (Temporal)   Resp 16   Ht 5' 1.5" (1.562 m)   Wt 86.4 kg   SpO2 97%   BMI 35.41 kg/m  General:   Alert,  pleasant and cooperative in NAD Head:  Normocephalic and atraumatic. Neck:  Supple; no masses or thyromegaly. Lungs:  Clear throughout to auscultation.    Heart:  Regular rate and rhythm. Abdomen:  Soft, nontender and nondistended. Normal bowel sounds, without guarding, and without rebound.   Neurologic:  Alert and  oriented x4;  grossly normal neurologically.  Impression/Plan: Hayley Simmons is here for an colonoscopy to be performed for positive cologaurd  Risks, benefits, limitations, and alternatives regarding  colonoscopy have been reviewed with the patient.  Questions have been answered.  All parties agreeable.   Sherri Sear, Hayley Simmons  10/10/2018, 9:25 AM

## 2018-10-10 NOTE — Anesthesia Procedure Notes (Signed)
Procedure Name: MAC Date/Time: 10/10/2018 9:30 AM Performed by: Jeannene Patella, CRNA Pre-anesthesia Checklist: Patient identified, Emergency Drugs available, Suction available, Patient being monitored and Timeout performed Patient Re-evaluated:Patient Re-evaluated prior to induction Oxygen Delivery Method: Nasal cannula Preoxygenation: Pre-oxygenation with 100% oxygen Induction Type: IV induction

## 2018-10-10 NOTE — Transfer of Care (Signed)
Immediate Anesthesia Transfer of Care Note  Patient: Hayley Simmons  Procedure(s) Performed: COLONOSCOPY WITH PROPOFOL (N/A ) POLYPECTOMY (Rectum)  Patient Location: PACU  Anesthesia Type: General  Level of Consciousness: awake, alert  and patient cooperative  Airway and Oxygen Therapy: Patient Spontanous Breathing and Patient connected to supplemental oxygen  Post-op Assessment: Post-op Vital signs reviewed, Patient's Cardiovascular Status Stable, Respiratory Function Stable, Patent Airway and No signs of Nausea or vomiting  Post-op Vital Signs: Reviewed and stable  Complications: No apparent anesthesia complications

## 2018-10-11 ENCOUNTER — Encounter: Payer: Self-pay | Admitting: Gastroenterology

## 2018-10-11 DIAGNOSIS — M059 Rheumatoid arthritis with rheumatoid factor, unspecified: Secondary | ICD-10-CM | POA: Diagnosis not present

## 2018-10-11 DIAGNOSIS — M170 Bilateral primary osteoarthritis of knee: Secondary | ICD-10-CM | POA: Diagnosis not present

## 2018-10-11 DIAGNOSIS — E669 Obesity, unspecified: Secondary | ICD-10-CM | POA: Diagnosis not present

## 2018-10-11 DIAGNOSIS — Z79899 Other long term (current) drug therapy: Secondary | ICD-10-CM | POA: Diagnosis not present

## 2018-10-12 ENCOUNTER — Encounter: Payer: Self-pay | Admitting: Gastroenterology

## 2018-10-17 ENCOUNTER — Other Ambulatory Visit: Payer: Self-pay | Admitting: Family Medicine

## 2018-10-17 DIAGNOSIS — F411 Generalized anxiety disorder: Secondary | ICD-10-CM

## 2018-10-17 DIAGNOSIS — F320 Major depressive disorder, single episode, mild: Secondary | ICD-10-CM

## 2018-10-17 NOTE — Telephone Encounter (Signed)
Refill request for general medication: Cymbalta 60 mg  Last office visit: 08/22/2018  Last physical exam: 11/07/2016  Follow-ups on file. 11/21/2018

## 2018-11-05 ENCOUNTER — Other Ambulatory Visit: Payer: Self-pay | Admitting: Family Medicine

## 2018-11-05 DIAGNOSIS — K219 Gastro-esophageal reflux disease without esophagitis: Secondary | ICD-10-CM

## 2018-11-06 NOTE — Telephone Encounter (Signed)
Refill request for general medication. Omeprazole to [facility]   Last office visit 09/16/2018   Follow up on 11/21/2018

## 2018-11-15 ENCOUNTER — Telehealth: Payer: Self-pay | Admitting: Family Medicine

## 2018-11-15 NOTE — Telephone Encounter (Signed)
Pt declined AWV with [facility]

## 2018-11-21 ENCOUNTER — Encounter: Payer: Self-pay | Admitting: Family Medicine

## 2018-11-21 ENCOUNTER — Ambulatory Visit (INDEPENDENT_AMBULATORY_CARE_PROVIDER_SITE_OTHER): Payer: Medicare Other | Admitting: Family Medicine

## 2018-11-21 VITALS — BP 118/84 | HR 63 | Temp 97.9°F | Resp 14 | Ht 61.0 in | Wt 203.6 lb

## 2018-11-21 DIAGNOSIS — K219 Gastro-esophageal reflux disease without esophagitis: Secondary | ICD-10-CM

## 2018-11-21 DIAGNOSIS — R7303 Prediabetes: Secondary | ICD-10-CM

## 2018-11-21 DIAGNOSIS — N183 Chronic kidney disease, stage 3 unspecified: Secondary | ICD-10-CM

## 2018-11-21 DIAGNOSIS — M170 Bilateral primary osteoarthritis of knee: Secondary | ICD-10-CM | POA: Diagnosis not present

## 2018-11-21 DIAGNOSIS — F320 Major depressive disorder, single episode, mild: Secondary | ICD-10-CM

## 2018-11-21 DIAGNOSIS — E785 Hyperlipidemia, unspecified: Secondary | ICD-10-CM

## 2018-11-21 DIAGNOSIS — Z79899 Other long term (current) drug therapy: Secondary | ICD-10-CM

## 2018-11-21 DIAGNOSIS — Z96652 Presence of left artificial knee joint: Secondary | ICD-10-CM

## 2018-11-21 DIAGNOSIS — E038 Other specified hypothyroidism: Secondary | ICD-10-CM

## 2018-11-21 DIAGNOSIS — D84821 Immunodeficiency due to drugs: Secondary | ICD-10-CM

## 2018-11-21 DIAGNOSIS — E039 Hypothyroidism, unspecified: Secondary | ICD-10-CM

## 2018-11-21 DIAGNOSIS — K635 Polyp of colon: Secondary | ICD-10-CM

## 2018-11-21 DIAGNOSIS — D125 Benign neoplasm of sigmoid colon: Secondary | ICD-10-CM

## 2018-11-21 DIAGNOSIS — I10 Essential (primary) hypertension: Secondary | ICD-10-CM

## 2018-11-21 DIAGNOSIS — M059 Rheumatoid arthritis with rheumatoid factor, unspecified: Secondary | ICD-10-CM | POA: Diagnosis not present

## 2018-11-21 DIAGNOSIS — F411 Generalized anxiety disorder: Secondary | ICD-10-CM

## 2018-11-21 DIAGNOSIS — I340 Nonrheumatic mitral (valve) insufficiency: Secondary | ICD-10-CM

## 2018-11-21 MED ORDER — INSULIN PEN NEEDLE 32G X 6 MM MISC: 1.0000 | 1 refills | Status: DC

## 2018-11-21 MED ORDER — SEMAGLUTIDE(0.25 OR 0.5MG/DOS) 2 MG/1.5ML ~~LOC~~ SOPN: 0.5000 mg | PEN_INJECTOR | SUBCUTANEOUS | 1 refills | Status: DC

## 2018-11-21 NOTE — Assessment & Plan Note (Signed)
Discussed importance of 150 minutes of physical activity weekly, eat two servings of fish weekly, eat one serving of tree nuts ( cashews, pistachios, pecans, almonds..) every other day, eat 6 servings of fruit/vegetables daily and drink plenty of water and avoid sweet beverages. 

## 2018-11-21 NOTE — Progress Notes (Signed)
Established Patient Office Visit  Subjective:  Patient ID: Hayley Simmons, female    DOB: [birth year]  Age: 73 y.o. MRN: [id number]  CC:  Chief Complaint  Patient presents with  . Follow-up    HPI Hayley Simmons presents for follow up:  Prediabetes: diagnosed July 2018, she has never been on medication. Denies polyphagia, polydipsia or polyuria. We will check A1C today. Did discuss GLP-1 therapy vs Metformin, she has not family or personal history of thyroid cancer, no personal history of pancreatitis.   Multiple Colon Polyps: she did finally have colonoscopy performed 10/10/2018 by Dr. Marius Ditch, and had multiple polyps. Repeat colonoscopy in 3 years.  Morbid obesity: she has gained 11lbs back since last.  Again explained significant concern for possible colorectal cancer.  She is not exercising regularly - joint pain is controlled - discussed need for routine exercise and will set goal of walking 68min twice weekly to start.  She does not follow a particularly healthy diet. Discussed morbid obesity   Elevated TSH: hx subclinical hypothyroidism - last check was normal 6 mos ago, denies palpitations, is gaining weight, denies hair/skin/nail changes or constiaption/diarrhea.  We will check today.  Dyslipidemia:On Crestor now instead of Lipitor, denies side effects of medication - no myalgias, chest pain, or shortness of breath. Taking 1 baby aspirin daily. Last LDL was at [facility] May 2019, we will recheck today.  HTN/Mitral Incompetence: bp is at goal today. Taking Lopressor 100mg  BID and Benicar 20-12.5. No chest pain or palpitation, no vision changes, no BLE edema, lightheadedness or dizziness.  Has murmur from Mitral Incompetence (dx'd at age 39).  Stable and unchanged.  GERD: doing better, taking omeprazole only when needed; discussed long term risk of PPI, such as dementia, osteoporosis, colitis and heart disease.She is doing well on omeprazole PRN  Major Depression/ Anxiety:  going on for years, struggles with relationship with her daughter, and it makes her sad.  She recently lost her brother on 08/03/2018 - he had cancer, but was doing well, then passed suddenly.  She is off of Xanax now.  She is now taking Cymbalt, but had only been taking PRN - advised to take daily to help with symptoms.   Office Visit from 11/21/2018 in [facility]  PHQ-9 Total Score  0     Rheumatoid Arthritis: Arhtralgias started after left knee replacement 3 months ago, aching and swelling on both wrists, ankles, hands, and top of her feet. No redness or increase in warmth.  She had elevated RF at last visit in September 2019 - saw rheumatology and was given prednisone and methotrexate (taking this on Mondays).  She has routine laboratory follow up now.  She feels a lot better now - still having some joint pain but not nearly as bad as before - mostly has hand stiffness in the mornings. Unchanged.  Past Medical History:  Diagnosis Date  . Allergy   . Anxiety   . GERD (gastroesophageal reflux disease)   . Hyperlipidemia   . Hypertension   . Osteoarthritis   . Rheumatoid arthritis ([place])   . Shingles     Past Surgical History:  Procedure Laterality Date  . ABDOMINAL HYSTERECTOMY  1994  . CARPAL TUNNEL RELEASE Right 07/29/2008  . COLONOSCOPY WITH PROPOFOL N/A 10/10/2018   Procedure: COLONOSCOPY WITH PROPOFOL;  Surgeon: Lin Landsman, MD;  Location: [place];  Service: Endoscopy;  Laterality: N/A;  . FOOT SURGERY Right    bunion  . POLYPECTOMY  10/10/2018   Procedure: POLYPECTOMY;  Surgeon: Lin Landsman, MD;  Location: [place];  Service: Endoscopy;;  . TOTAL KNEE ARTHROPLASTY Left 02/19/2018   Procedure: TOTAL KNEE ARTHROPLASTY;  Surgeon: Lovell Sheehan, MD;  Location: [facility];  Service: Orthopedics;  Laterality: Left;    Family History  Problem Relation Age of Onset  . Gout Mother   . Dementia Mother   . Kidney disease Mother     . Cancer Mother        kidney  . Heart attack Mother   . Heart attack Father   . Cancer Brother     Social History   Socioeconomic History  . Marital status: Married    Spouse name: Neldon Newport   . Number of children: 1  . Years of education: Not on file  . Highest education level: 12th grade  Occupational History  . 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 Financial resource strain: Not on file  . Food insecurity:    Worry: Not on file    Inability: Not on file  . Transportation needs:    Medical: Not on file    Non-medical: Not on file  Tobacco Use  . Smoking status: Former Smoker    Packs/day: 0.25    Years: 31.00    Pack years: 7.75    Types: Cigarettes    Last attempt to quit: 11/18/1997    Years since quitting: 21.0  . Smokeless tobacco: Never Used  . Tobacco comment: quit in 11/1997  Substance and Sexual Activity  . Alcohol use: No    Alcohol/week: 0.0 standard drinks  . Drug use: No  . Sexual activity: Not Currently  Lifestyle  . Physical activity:    Days per week: Not on file    Minutes per session: Not on file  . Stress: Not on file  Relationships  . Social connections:    Talks on phone: Not on file    Gets together: Not on file    Attends religious service: Not on file    Active member of club or organization: Not on file    Attends meetings of clubs or organizations: Not on file    Relationship status: Not on file  . Intimate partner violence:    Fear of current or ex partner: Not on file    Emotionally abused: Not on file    Physically abused: Not on file    Forced sexual activity: Not on file  Other Topics Concern  . Not on file  Social History Narrative  . Not on file    Outpatient Medications Prior to Visit  Medication Sig Dispense Refill  . aspirin 81 MG chewable tablet Chew 1 tablet (81 mg total) by mouth 2 (two) times daily. 30 tablet 0  . Calcium  Carb-Cholecalciferol (CALCIUM 600+D3 PO) Take 1 tablet by mouth 2 (two) times daily.    . diclofenac sodium (VOLTAREN) 1 % GEL Apply 1-2 g topically 4 (four) times daily as needed (for pain.).    Marland Kitchen docusate sodium (COLACE) 100 MG capsule Take 1 capsule (100 mg total) by mouth 2 (two) times daily. 10 capsule 0  . DULoxetine (CYMBALTA) 60 MG capsule TAKE 1 CAPSULE(60 MG) BY MOUTH DAILY 90 capsule 0  . fluticasone (FLONASE) 50 MCG/ACT nasal spray Place 1-2 sprays into both nostrils daily as needed for allergies.    Marland Kitchen loratadine (  CLARITIN) 10 MG tablet as needed.    . methotrexate (RHEUMATREX) 2.5 MG tablet Take 17.5 mg by mouth once a week. Takes on Mondays     . metoprolol tartrate (LOPRESSOR) 100 MG tablet Take 1 tablet (100 mg total) by mouth 2 (two) times daily. 180 tablet 1  . olmesartan-hydrochlorothiazide (BENICAR HCT) 20-12.5 MG tablet Take 1 tablet by mouth daily. 90 tablet 1  . omeprazole (PRILOSEC) 20 MG capsule TAKE 1 CAPSULE(20 MG) BY MOUTH DAILY 90 capsule 0  . rosuvastatin (CRESTOR) 40 MG tablet Take 1 tablet (40 mg total) by mouth every evening. In place of Atorvastatin 90 tablet 1  . Aspirin-Acetaminophen-Caffeine (EXCEDRIN PO) Take by mouth as needed.     No facility-administered medications prior to visit.     Allergies  Allergen Reactions  . Caduet [Amlodipine-Atorvastatin] Other (See Comments)    Makes Pt feel bad  . Cyclosporine Other (See Comments), Nausea And Vomiting and Nausea Only  . Escitalopram Other (See Comments)    ROS Review of Systems  Constitutional: Negative.  Negative for chills, fatigue, fever and unexpected weight change.  HENT: Negative.  Negative for congestion and sore throat.   Respiratory: Negative for cough and shortness of breath.   Cardiovascular: Negative for chest pain, palpitations and leg swelling.  Gastrointestinal: Negative for abdominal pain, constipation, diarrhea, nausea and vomiting.  Endocrine: Negative for polydipsia, polyphagia  and polyuria.  Genitourinary: Negative.   Musculoskeletal: Negative.   Skin: Negative.   Neurological: Negative for light-headedness and headaches.  Hematological: Negative.   Psychiatric/Behavioral: Negative.   All other systems reviewed and are negative.    Objective:    Physical Exam  Constitutional: She is oriented to person, place, and time. She appears well-developed and well-nourished. No distress.  HENT:  Head: Normocephalic and atraumatic.  Right Ear: External ear normal.  Left Ear: External ear normal.  Nose: Nose normal.  Mouth/Throat: Oropharynx is clear and moist. No oropharyngeal exudate.  Eyes: Pupils are equal, round, and reactive to light. Conjunctivae and EOM are normal.  Neck: Normal range of motion. Neck supple. No JVD present. No thyromegaly present.  Cardiovascular: Normal rate and regular rhythm. Exam reveals no gallop and no friction rub.  Murmur (1/6) heard. Pulmonary/Chest: Breath sounds normal. She has no wheezes. She has no rales. She exhibits no tenderness.  Musculoskeletal: Normal range of motion.        General: No tenderness or edema.  Lymphadenopathy:    She has no cervical adenopathy.  Neurological: She is alert and oriented to person, place, and time. No cranial nerve deficit.  Skin: Skin is warm and dry. No rash noted.  Psychiatric: She has a normal mood and affect. Her behavior is normal. Judgment and thought content normal.  Nursing note and vitals reviewed.   BP 118/84   Pulse 63   Temp 97.9 F (36.6 C) (Oral)   Resp 14   Ht 5\' 1"  (1.549 m)   Wt 203 lb 9.6 oz (92.4 kg)   SpO2 93%   BMI 38.47 kg/m  Wt Readings from Last 3 Encounters:  11/21/18 203 lb 9.6 oz (92.4 kg)  10/10/18 190 lb 8 oz (86.4 kg)  08/22/18 191 lb 1.6 oz (86.7 kg)    There are no preventive care reminders to display for this patient.  There are no preventive care reminders to display for this patient.  Lab Results  Component Value Date   TSH 3.02  05/22/2018   Lab Results  Component Value Date  WBC 9.8 08/22/2018   HGB 12.5 08/22/2018   HCT 37.6 08/22/2018   MCV 89.5 08/22/2018   PLT 306 08/22/2018   Lab Results  Component Value Date   NA 140 08/22/2018   K 4.3 08/22/2018   CO2 30 08/22/2018   GLUCOSE 89 08/22/2018   BUN 21 08/22/2018   CREATININE 0.87 08/22/2018   BILITOT 0.5 05/22/2018   ALKPHOS 144 (H) 04/11/2017   AST 11 05/22/2018   ALT 7 05/22/2018   PROT 7.1 05/22/2018   ALBUMIN 3.9 04/11/2017   CALCIUM 9.6 08/22/2018   ANIONGAP 10 02/20/2018   Lab Results  Component Value Date   CHOL 152 02/07/2018   Lab Results  Component Value Date   HDL 37 (L) 02/07/2018   Lab Results  Component Value Date   LDLCALC 77 02/07/2018   Lab Results  Component Value Date   TRIG 191 (H) 02/07/2018   Lab Results  Component Value Date   CHOLHDL 4.1 02/07/2018   Lab Results  Component Value Date   HGBA1C 5.8 (H) 05/22/2018      Assessment & Plan:   Problem List Items Addressed This Visit      Cardiovascular and Mediastinum   Essential hypertension - Primary    At goal, DASH diet discussed.      Relevant Orders   COMPLETE METABOLIC PANEL WITH GFR   MI (mitral incompetence)    Stable, no symptoms at this time.        Digestive   GERD (gastroesophageal reflux disease)    Continue Omeprazole      Multiple polyps of sigmoid colon    Repeat colonoscopy in 3 years        Musculoskeletal and Integument   Osteoarthritis of both knees    Voltaren gel PRN      Seropositive rheumatoid arthritis ([place])    Continue follow up with Rheumatology - Dr. Meda Coffee        Genitourinary   Chronic kidney disease, stage III (moderate) ([place])    Will check labs today        Other   Obesity, morbid ([place])    Discussed importance of 150 minutes of physical activity weekly, eat two servings of fish weekly, eat one serving of tree nuts ( cashews, pistachios, pecans, almonds.Marland Kitchen) every other day, eat 6 servings of  fruit/vegetables daily and drink plenty of water and avoid sweet beverages.        Relevant Medications   Semaglutide,0.25 or 0.5MG /DOS, (OZEMPIC, 0.25 OR 0.5 MG/DOSE,) 2 MG/1.5ML SOPN   Insulin Pen Needle (NOVOFINE) 32G X 6 MM MISC   Other Relevant Orders   TSH   Elevated TSH    Labs today      Prediabetes    Labs, diet discussed, start Ozempic - sample given in office for 0.25mg  dosing x4 weeks.      Relevant Medications   Semaglutide,0.25 or 0.5MG /DOS, (OZEMPIC, 0.25 OR 0.5 MG/DOSE,) 2 MG/1.5ML SOPN   Insulin Pen Needle (NOVOFINE) 32G X 6 MM MISC   Other Relevant Orders   Hemoglobin A1c   COMPLETE METABOLIC PANEL WITH GFR   Total knee replacement status, left   Dyslipidemia    Continue crestor, labs today      Relevant Orders   Lipid panel   GAD (generalized anxiety disorder)    Continue Cymbalta      Immunocompromised state due to drug therapy    Continue Methotrexate.      Mild major depression ([place])  Stable, advised daily Cymbalta        Meds ordered this encounter  Medications  . Semaglutide,0.25 or 0.5MG /DOS, (OZEMPIC, 0.25 OR 0.5 MG/DOSE,) 2 MG/1.5ML SOPN    Sig: Inject 0.5 mg into the skin every 7 (seven) days.    Dispense:  3 pen    Refill:  1    Order Specific Question:   Supervising Provider    Answer:   Steele Sizer [[id number]]  . Insulin Pen Needle (NOVOFINE) 32G X 6 MM MISC    Sig: 1 each by Does not apply route every 7 (seven) days.    Dispense:  100 each    Refill:  1    Order Specific Question:   Supervising Provider    Answer:   Steele Sizer [[id number]]   Follow-up: Return in about 3 months (around 02/21/2019) for Follow up w/ Dr. Ancil Boozer.   Hubbard Hartshorn, FNP

## 2018-11-21 NOTE — Assessment & Plan Note (Signed)
Stable, no symptoms at this time.

## 2018-11-21 NOTE — Assessment & Plan Note (Signed)
Continue crestor, labs today

## 2018-11-21 NOTE — Assessment & Plan Note (Signed)
Repeat colonoscopy in 3 years

## 2018-11-21 NOTE — Assessment & Plan Note (Signed)
Continue Methotrexate 

## 2018-11-21 NOTE — Assessment & Plan Note (Signed)
Labs today

## 2018-11-21 NOTE — Patient Instructions (Signed)
Diabetes Mellitus and Nutrition, Adult When you have diabetes (diabetes mellitus), it is very important to have healthy eating habits because your blood sugar (glucose) levels are greatly affected by what you eat and drink. Eating healthy foods in the appropriate amounts, at about the same times every day, can help you:  Control your blood glucose.  Lower your risk of heart disease.  Improve your blood pressure.  Reach or maintain a healthy weight. Every person with diabetes is different, and each person has different needs for a meal plan. Your health care provider may recommend that you work with a diet and nutrition specialist (dietitian) to make a meal plan that is best for you. Your meal plan may vary depending on factors such as:  The calories you need.  The medicines you take.  Your weight.  Your blood glucose, blood pressure, and cholesterol levels.  Your activity level.  Other health conditions you have, such as heart or kidney disease. How do carbohydrates affect me? Carbohydrates, also called carbs, affect your blood glucose level more than any other type of food. Eating carbs naturally raises the amount of glucose in your blood. Carb counting is a method for keeping track of how many carbs you eat. Counting carbs is important to keep your blood glucose at a healthy level, especially if you use insulin or take certain oral diabetes medicines. It is important to know how many carbs you can safely have in each meal. This is different for every person. Your dietitian can help you calculate how many carbs you should have at each meal and for each snack. Foods that contain carbs include:  Bread, cereal, rice, pasta, and crackers.  Potatoes and corn.  Peas, beans, and lentils.  Milk and yogurt.  Fruit and juice.  Desserts, such as cakes, cookies, ice cream, and candy. How does alcohol affect me? Alcohol can cause a sudden decrease in blood glucose (hypoglycemia),  especially if you use insulin or take certain oral diabetes medicines. Hypoglycemia can be a life-threatening condition. Symptoms of hypoglycemia (sleepiness, dizziness, and confusion) are similar to symptoms of having too much alcohol. If your health care provider says that alcohol is safe for you, follow these guidelines:  Limit alcohol intake to no more than 1 drink per day for nonpregnant women and 2 drinks per day for men. One drink equals 12 oz of beer, 5 oz of wine, or 1 oz of hard liquor.  Do not drink on an empty stomach.  Keep yourself hydrated with water, diet soda, or unsweetened iced tea.  Keep in mind that regular soda, juice, and other mixers may contain a lot of sugar and must be counted as carbs. What are tips for following this plan?  Reading food labels  Start by checking the serving size on the "Nutrition Facts" label of packaged foods and drinks. The amount of calories, carbs, fats, and other nutrients listed on the label is based on one serving of the item. Many items contain more than one serving per package.  Check the total grams (g) of carbs in one serving. You can calculate the number of servings of carbs in one serving by dividing the total carbs by 15. For example, if a food has 30 g of total carbs, it would be equal to 2 servings of carbs.  Check the number of grams (g) of saturated and trans fats in one serving. Choose foods that have low or no amount of these fats.  Check the number of   milligrams (mg) of salt (sodium) in one serving. Most people should limit total sodium intake to less than 2,300 mg per day.  Always check the nutrition information of foods labeled as "low-fat" or "nonfat". These foods may be higher in added sugar or refined carbs and should be avoided.  Talk to your dietitian to identify your daily goals for nutrients listed on the label. Shopping  Avoid buying canned, premade, or processed foods. These foods tend to be high in fat, sodium,  and added sugar.  Shop around the outside edge of the grocery store. This includes fresh fruits and vegetables, bulk grains, fresh meats, and fresh dairy. Cooking  Use low-heat cooking methods, such as baking, instead of high-heat cooking methods like deep frying.  Cook using healthy oils, such as olive, canola, or sunflower oil.  Avoid cooking with butter, cream, or high-fat meats. Meal planning  Eat meals and snacks regularly, preferably at the same times every day. Avoid going long periods of time without eating.  Eat foods high in fiber, such as fresh fruits, vegetables, beans, and whole grains. Talk to your dietitian about how many servings of carbs you can eat at each meal.  Eat 4-6 ounces (oz) of lean protein each day, such as lean meat, chicken, fish, eggs, or tofu. One oz of lean protein is equal to: ? 1 oz of meat, chicken, or fish. ? 1 egg. ?  cup of tofu.  Eat some foods each day that contain healthy fats, such as avocado, nuts, seeds, and fish. Lifestyle  Check your blood glucose regularly.  Exercise regularly as told by your health care provider. This may include: ? 150 minutes of moderate-intensity or vigorous-intensity exercise each week. This could be brisk walking, biking, or water aerobics. ? Stretching and doing strength exercises, such as yoga or weightlifting, at least 2 times a week.  Take medicines as told by your health care provider.  Do not use any products that contain nicotine or tobacco, such as cigarettes and e-cigarettes. If you need help quitting, ask your health care provider.  Work with a Social worker or diabetes educator to identify strategies to manage stress and any emotional and social challenges. Questions to ask a health care provider  Do I need to meet with a diabetes educator?  Do I need to meet with a dietitian?  What number can I call if I have questions?  When are the best times to check my blood glucose? Where to find more  information:  American Diabetes Association: diabetes.org  Academy of Nutrition and Dietetics: www.eatright.[employer] of Diabetes and Digestive and Kidney Diseases (NIH): [employer] Summary  A healthy meal plan will help you control your blood glucose and maintain a healthy lifestyle.  Working with a diet and nutrition specialist (dietitian) can help you make a meal plan that is best for you.  Keep in mind that carbohydrates (carbs) and alcohol have immediate effects on your blood glucose levels. It is important to count carbs and to use alcohol carefully. This information is not intended to replace advice given to you by your health care provider. Make sure you discuss any questions you have with your health care provider. Document Released: 06/02/2005 Document Revised: 04/05/2017 Document Reviewed: 10/10/2016 Elsevier Interactive Patient Education  2019 [place] DASH stands for "Dietary Approaches to Stop Hypertension." The DASH eating plan is a healthy eating plan that has been shown to reduce high blood pressure (hypertension). It may also  reduce your risk for type 2 diabetes, heart disease, and stroke. The DASH eating plan may also help with weight loss. What are tips for following this plan?  General guidelines  Avoid eating more than 2,300 mg (milligrams) of salt (sodium) a day. If you have hypertension, you may need to reduce your sodium intake to 1,500 mg a day.  Limit alcohol intake to no more than 1 drink a day for nonpregnant women and 2 drinks a day for men. One drink equals 12 oz of beer, 5 oz of wine, or 1 oz of hard liquor.  Work with your health care provider to maintain a healthy body weight or to lose weight. Ask what an ideal weight is for you.  Get at least 30 minutes of exercise that causes your heart to beat faster (aerobic exercise) most days of the week. Activities may include walking, swimming, or biking.  Work  with your health care provider or diet and nutrition specialist (dietitian) to adjust your eating plan to your individual calorie needs. Reading food labels   Check food labels for the amount of sodium per serving. Choose foods with less than 5 percent of the Daily Value of sodium. Generally, foods with less than 300 mg of sodium per serving fit into this eating plan.  To find whole grains, look for the word "whole" as the first word in the ingredient list. Shopping  Buy products labeled as "low-sodium" or "no salt added."  Buy fresh foods. Avoid canned foods and premade or frozen meals. Cooking  Avoid adding salt when cooking. Use salt-free seasonings or herbs instead of table salt or sea salt. Check with your health care provider or pharmacist before using salt substitutes.  Do not fry foods. Cook foods using healthy methods such as baking, boiling, grilling, and broiling instead.  Cook with heart-healthy oils, such as olive, canola, soybean, or sunflower oil. Meal planning  Eat a balanced diet that includes: ? 5 or more servings of fruits and vegetables each day. At each meal, try to fill half of your plate with fruits and vegetables. ? Up to 6-8 servings of whole grains each day. ? Less than 6 oz of lean meat, poultry, or fish each day. A 3-oz serving of meat is about the same size as a deck of cards. One egg equals 1 oz. ? 2 servings of low-fat dairy each day. ? A serving of nuts, seeds, or beans 5 times each week. ? Heart-healthy fats. Healthy fats called Omega-3 fatty acids are found in foods such as flaxseeds and coldwater fish, like sardines, salmon, and mackerel.  Limit how much you eat of the following: ? Canned or prepackaged foods. ? Food that is high in trans fat, such as fried foods. ? Food that is high in saturated fat, such as fatty meat. ? Sweets, desserts, sugary drinks, and other foods with added sugar. ? Full-fat dairy products.  Do not salt foods before  eating.  Try to eat at least 2 vegetarian meals each week.  Eat more home-cooked food and less restaurant, buffet, and fast food.  When eating at a restaurant, ask that your food be prepared with less salt or no salt, if possible. What foods are recommended? The items listed may not be a complete list. Talk with your dietitian about what dietary choices are best for you. Grains Whole-grain or whole-wheat bread. Whole-grain or whole-wheat pasta. Brown rice. [place]. Bulgur. Whole-grain and low-sodium cereals. Pita bread. Low-fat, low-sodium crackers. Whole-wheat flour  tortillas. Vegetables Fresh or frozen vegetables (raw, steamed, roasted, or grilled). Low-sodium or reduced-sodium tomato and vegetable juice. Low-sodium or reduced-sodium tomato sauce and tomato paste. Low-sodium or reduced-sodium canned vegetables. Fruits All fresh, dried, or frozen fruit. Canned fruit in natural juice (without added sugar). Meat and other protein foods Skinless chicken or [place]. Ground chicken or [place]. Pork with fat trimmed off. Fish and seafood. Egg whites. Dried beans, peas, or lentils. Unsalted nuts, nut butters, and seeds. Unsalted canned beans. Lean cuts of beef with fat trimmed off. Low-sodium, lean deli meat. Dairy Low-fat (1%) or fat-free (skim) milk. Fat-free, low-fat, or reduced-fat cheeses. Nonfat, low-sodium ricotta or cottage cheese. Low-fat or nonfat yogurt. Low-fat, low-sodium cheese. Fats and oils Soft margarine without trans fats. Vegetable oil. Low-fat, reduced-fat, or light mayonnaise and salad dressings (reduced-sodium). Canola, safflower, olive, soybean, and sunflower oils. Avocado. Seasoning and other foods Herbs. Spices. Seasoning mixes without salt. Unsalted popcorn and pretzels. Fat-free sweets. What foods are not recommended? The items listed may not be a complete list. Talk with your dietitian about what dietary choices are best for you. Grains Baked goods made with fat,  such as croissants, muffins, or some breads. Dry pasta or rice meal packs. Vegetables Creamed or fried vegetables. Vegetables in a cheese sauce. Regular canned vegetables (not low-sodium or reduced-sodium). Regular canned tomato sauce and paste (not low-sodium or reduced-sodium). Regular tomato and vegetable juice (not low-sodium or reduced-sodium). Angie Fava. Olives. Fruits Canned fruit in a light or heavy syrup. Fried fruit. Fruit in cream or butter sauce. Meat and other protein foods Fatty cuts of meat. Ribs. Fried meat. Berniece Salines. Sausage. Bologna and other processed lunch meats. Salami. Fatback. Hotdogs. Bratwurst. Salted nuts and seeds. Canned beans with added salt. Canned or smoked fish. Whole eggs or egg yolks. Chicken or [place] with skin. Dairy Whole or 2% milk, cream, and half-and-half. Whole or full-fat cream cheese. Whole-fat or sweetened yogurt. Full-fat cheese. Nondairy creamers. Whipped toppings. Processed cheese and cheese spreads. Fats and oils Butter. Stick margarine. Lard. Shortening. Ghee. Bacon fat. Tropical oils, such as coconut, palm kernel, or palm oil. Seasoning and other foods Salted popcorn and pretzels. Onion salt, garlic salt, seasoned salt, table salt, and sea salt. Worcestershire sauce. Tartar sauce. Barbecue sauce. Teriyaki sauce. Soy sauce, including reduced-sodium. Steak sauce. Canned and packaged gravies. Fish sauce. Oyster sauce. Cocktail sauce. Horseradish that you find on the shelf. Ketchup. Mustard. Meat flavorings and tenderizers. Bouillon cubes. Hot sauce and Tabasco sauce. Premade or packaged marinades. Premade or packaged taco seasonings. Relishes. Regular salad dressings. Where to find more information:  National Heart, Lung, and [place]: https://wilson-eaton.com/  American Heart Association: www.heart.org Summary  The DASH eating plan is a healthy eating plan that has been shown to reduce high blood pressure (hypertension). It may also reduce your risk for  type 2 diabetes, heart disease, and stroke.  With the DASH eating plan, you should limit salt (sodium) intake to 2,300 mg a day. If you have hypertension, you may need to reduce your sodium intake to 1,500 mg a day.  When on the DASH eating plan, aim to eat more fresh fruits and vegetables, whole grains, lean proteins, low-fat dairy, and heart-healthy fats.  Work with your health care provider or diet and nutrition specialist (dietitian) to adjust your eating plan to your individual calorie needs. This information is not intended to replace advice given to you by your health care provider. Make sure you discuss any questions you have with your health care provider.  Document Released: 08/25/2011 Document Revised: 08/29/2016 Document Reviewed: 08/29/2016 Elsevier Interactive Patient Education  2019 [employer].

## 2018-11-21 NOTE — Assessment & Plan Note (Signed)
Continue Cymbalta.

## 2018-11-21 NOTE — Assessment & Plan Note (Signed)
Will check labs today

## 2018-11-21 NOTE — Assessment & Plan Note (Signed)
Stable, advised daily Cymbalta

## 2018-11-21 NOTE — Assessment & Plan Note (Signed)
Continue follow up with Rheumatology - Dr. Meda Coffee

## 2018-11-21 NOTE — Assessment & Plan Note (Signed)
Voltaren gel PRN

## 2018-11-21 NOTE — Assessment & Plan Note (Signed)
Labs, diet discussed, start Ozempic - sample given in office for 0.25mg  dosing x4 weeks.

## 2018-11-21 NOTE — Assessment & Plan Note (Signed)
Continue Omeprazole ?

## 2018-11-21 NOTE — Assessment & Plan Note (Signed)
At goal, DASH diet discussed.

## 2018-11-22 LAB — LIPID PANEL
Cholesterol: 166 mg/dL (ref <200)
HDL: 48 mg/dL — ABNORMAL LOW (ref >=50)
LDL CHOLESTEROL (CALC): 92 mg/dL
Non-HDL Cholesterol (Calc): 118 mg/dL (calc) (ref <130)
Total CHOL/HDL Ratio: 3.5 (calc) (ref <5.0)
Triglycerides: 166 mg/dL — ABNORMAL HIGH (ref <150)

## 2018-11-22 LAB — COMPLETE METABOLIC PANEL WITH GFR
AG Ratio: 1.6 (calc) (ref 1.0–2.5)
ALT: 15 U/L (ref 6–29)
AST: 19 U/L (ref 10–35)
Albumin: 4.1 g/dL (ref 3.6–5.1)
Alkaline phosphatase (APISO): 127 U/L (ref 37–153)
BILIRUBIN TOTAL: 0.8 mg/dL (ref 0.2–1.2)
BUN: 20 mg/dL (ref 7–25)
CHLORIDE: 103 mmol/L (ref 98–110)
CO2: 32 mmol/L (ref 20–32)
Calcium: 9.5 mg/dL (ref 8.6–10.4)
Creat: 0.81 mg/dL (ref 0.60–0.93)
GFR, Est African American: 85 mL/min/{1.73_m2} (ref >=60)
GFR, Est Non African American: 74 mL/min/{1.73_m2} (ref >=60)
Globulin: 2.6 g/dL (calc) (ref 1.9–3.7)
Glucose, Bld: 90 mg/dL (ref 65–99)
Potassium: 4.3 mmol/L (ref 3.5–5.3)
Sodium: 140 mmol/L (ref 135–146)
Total Protein: 6.7 g/dL (ref 6.1–8.1)

## 2018-11-22 LAB — HEMOGLOBIN A1C
Hgb A1c MFr Bld: 5.4 % of total Hgb (ref <5.7)
Mean Plasma Glucose: 108 (calc)
eAG (mmol/L): 6 (calc)

## 2018-11-22 LAB — TSH: TSH: 4.44 mIU/L (ref 0.40–4.50)

## 2018-12-12 DIAGNOSIS — M059 Rheumatoid arthritis with rheumatoid factor, unspecified: Secondary | ICD-10-CM | POA: Diagnosis not present

## 2018-12-12 DIAGNOSIS — Z79899 Other long term (current) drug therapy: Secondary | ICD-10-CM | POA: Diagnosis not present

## 2019-01-14 DIAGNOSIS — Z79899 Other long term (current) drug therapy: Secondary | ICD-10-CM | POA: Diagnosis not present

## 2019-01-14 DIAGNOSIS — E669 Obesity, unspecified: Secondary | ICD-10-CM | POA: Diagnosis not present

## 2019-01-14 DIAGNOSIS — M170 Bilateral primary osteoarthritis of knee: Secondary | ICD-10-CM | POA: Diagnosis not present

## 2019-01-14 DIAGNOSIS — M059 Rheumatoid arthritis with rheumatoid factor, unspecified: Secondary | ICD-10-CM | POA: Diagnosis not present

## 2019-02-09 ENCOUNTER — Other Ambulatory Visit: Payer: Self-pay | Admitting: Family Medicine

## 2019-02-09 DIAGNOSIS — K219 Gastro-esophageal reflux disease without esophagitis: Secondary | ICD-10-CM

## 2019-02-14 ENCOUNTER — Ambulatory Visit: Payer: Self-pay | Admitting: Family Medicine

## 2019-02-14 NOTE — Chronic Care Management (AMB) (Signed)
Chronic Care Management   Note  02/14/2019 Name: Hayley Simmons MRN: [id number] DOB: [birth year]  Hayley Simmons is a 73 y.o. year old female who is a primary care patient of Hubbard Hartshorn, FNP. I reached out to [employer] by phone today in response to a referral sent by Hayley Simmons's health plan.    Ms. Carl was given information about Chronic Care Management services today including:  1. CCM service includes personalized support from designated clinical staff supervised by her physician, including individualized plan of care and coordination with other care providers 2. 24/7 contact phone numbers for assistance for urgent and routine care needs. 3. Service will only be billed when office clinical staff spend 20 minutes or more in a month to coordinate care. 4. Only one practitioner may furnish and bill the service in a calendar month. 5. The patient may stop CCM services at any time (effective at the end of the month) by phone call to the office staff. 6. The patient will be responsible for cost sharing (co-pay) of up to 20% of the service fee (after annual deductible is met).  Patient did not agree to services and does not wish to consider at this time.  Follow up plan: The patient has been provided with contact information for the chronic care management team and has been advised to call with any health related questions or concerns.   [place]  [email or web address]   ??[phone]

## 2019-02-21 ENCOUNTER — Encounter: Payer: Self-pay | Admitting: Family Medicine

## 2019-02-21 ENCOUNTER — Other Ambulatory Visit: Payer: Self-pay

## 2019-02-21 ENCOUNTER — Ambulatory Visit (INDEPENDENT_AMBULATORY_CARE_PROVIDER_SITE_OTHER): Payer: Medicare Other | Admitting: Family Medicine

## 2019-02-21 VITALS — BP 128/84 | HR 74 | Temp 98.3°F | Resp 16 | Ht 61.0 in | Wt 213.4 lb

## 2019-02-21 DIAGNOSIS — R7303 Prediabetes: Secondary | ICD-10-CM | POA: Diagnosis not present

## 2019-02-21 DIAGNOSIS — I340 Nonrheumatic mitral (valve) insufficiency: Secondary | ICD-10-CM | POA: Diagnosis not present

## 2019-02-21 DIAGNOSIS — E039 Hypothyroidism, unspecified: Secondary | ICD-10-CM

## 2019-02-21 DIAGNOSIS — N183 Chronic kidney disease, stage 3 unspecified: Secondary | ICD-10-CM

## 2019-02-21 DIAGNOSIS — E785 Hyperlipidemia, unspecified: Secondary | ICD-10-CM

## 2019-02-21 DIAGNOSIS — M170 Bilateral primary osteoarthritis of knee: Secondary | ICD-10-CM | POA: Diagnosis not present

## 2019-02-21 DIAGNOSIS — I10 Essential (primary) hypertension: Secondary | ICD-10-CM

## 2019-02-21 DIAGNOSIS — M059 Rheumatoid arthritis with rheumatoid factor, unspecified: Secondary | ICD-10-CM

## 2019-02-21 DIAGNOSIS — F320 Major depressive disorder, single episode, mild: Secondary | ICD-10-CM | POA: Diagnosis not present

## 2019-02-21 DIAGNOSIS — E038 Other specified hypothyroidism: Secondary | ICD-10-CM

## 2019-02-21 DIAGNOSIS — K219 Gastro-esophageal reflux disease without esophagitis: Secondary | ICD-10-CM

## 2019-02-21 DIAGNOSIS — F411 Generalized anxiety disorder: Secondary | ICD-10-CM

## 2019-02-21 MED ORDER — BUSPIRONE HCL 7.5 MG PO TABS: ORAL_TABLET | ORAL | 1 refills | Status: DC

## 2019-02-21 NOTE — Progress Notes (Signed)
Name: Hayley Simmons   MRN: [id number]    DOB: 03-13-1946   Date:02/21/2019       Progress Note  Subjective  Chief Complaint  Chief Complaint  Patient presents with  . Follow-up  . Medication Refill    HPI  Prediabetes: diagnosed July 2018, she has never been on medication. Denies polyphagia, polydipsia or polyuria - except at night she is getting up to urinate about twice. Last A1C was normal 3 mos ago.   Morbid obesity: She has gained 10lbs since last.  She is not exercising regularly - joint pain is controlled - discussed need for routine exercise and will set goal of walking 74min twice weekly to start, but she never started doing this after last visit. She does not follow a particularly healthy diet. Discussed morbid obesity.  She stopped ozempic after a few weeks because she didn't loose weight - explained need for longer use of medication; also discussed other options - she is not a candidate for stimulant therapy due to HTN and anxiety.  Not a candidate for Wellbutrin due to anxiety.  Does not want to try Metformin.  ElevatedTSH: hxsubclinical hypothyroidism - last check was normal 6 mos ago,denies palpitations, is gaining weight, denies hair/skin/nail changes or constiaption/diarrhea.Last check 3 mos ago was normal.  Dyslipidemia:On Crestor nowinstead of Lipitor, denies side effects of medication- no myalgias, chest pain, or shortness of breath. Taking 1 baby aspirin daily. Last LDL was at [facility] March 2020 and LDL was 92.  HTN/Mitral Incompetence: bpis at goal today.Taking Lopressor 100mg  BID and Benicar 20-12.5. No chest pain or palpitation, no vision changes, no BLE edema, lightheadedness or dizziness.  Has murmur from Mitral Incompetence (dx'd at age 70), but does not see cardiology.  Stable and unchanged.  GERD: doing better, takingomeprazole every other day;discussed long term risk of PPI, such as dementia, osteoporosis, colitis and heart disease.She is  doing well on omeprazole PRN.  Major Depression/Anxiety: Going on for years, struggles with relationship with her daughter, and it makes her sad.She recently lost her brother on 08/03/2018 - he had cancer, but was doing well, then passed suddenly. She is off of Xanax now. She is now taking Cymbalta, but has not been taking because it did not seem to be effective and she does not want to take anything daily.  Discussed Buspar, she is willing to try this.   Office Visit from 02/21/2019 in [facility]  PHQ-9 Total Score  0     Rheumatoid Arthritis: Arhtralgias started after left knee replacement 3 months ago, aching and swelling on both wrists, ankles, hands, and top of her feet. No redness or increase in warmth. She had elevated RF at last visit in September 2019 - saw rheumatology and was given prednisone and methotrexate (taking this on Mondays). She has routine laboratory follow up now. She feels a lot better now - still having some joint pain but not nearly as bad as before - mostly has hand stiffness in the mornings. Saw Dr. Meda Coffee 01/14/2019 and no changes..   Patient Active Problem List   Diagnosis Date Noted  . Mild major depression ([place]) 11/21/2018  . Dyslipidemia 08/22/2018  . GAD (generalized anxiety disorder) 08/22/2018  . Immunocompromised state due to drug therapy 08/22/2018  . Multiple polyps of sigmoid colon 08/22/2018  . Seropositive rheumatoid arthritis ([place]) 06/13/2018  . Total knee replacement status, left 02/19/2018  . Chronic kidney disease, stage III (moderate) ([place]) 01/23/2018  . MI (mitral incompetence)  01/18/2018  . Prediabetes 06/21/2016  . Elevated TSH 05/19/2016  . Osteoarthritis of both knees 06/24/2015  . Female stress incontinence 06/24/2015  . Essential hypertension 05/19/2015  . GERD (gastroesophageal reflux disease) 05/19/2015  . Hyperlipidemia 05/19/2015  . At risk for falling 05/19/2015  . Obesity, morbid ([place]) 05/19/2015     Past Surgical History:  Procedure Laterality Date  . ABDOMINAL HYSTERECTOMY  1994  . CARPAL TUNNEL RELEASE Right 07/29/2008  . COLONOSCOPY WITH PROPOFOL N/A 10/10/2018   Procedure: COLONOSCOPY WITH PROPOFOL;  Surgeon: Lin Landsman, MD;  Location: [place];  Service: Endoscopy;  Laterality: N/A;  . FOOT SURGERY Right    bunion  . POLYPECTOMY  10/10/2018   Procedure: POLYPECTOMY;  Surgeon: Lin Landsman, MD;  Location: [place];  Service: Endoscopy;;  . TOTAL KNEE ARTHROPLASTY Left 02/19/2018   Procedure: TOTAL KNEE ARTHROPLASTY;  Surgeon: Lovell Sheehan, MD;  Location: [facility];  Service: Orthopedics;  Laterality: Left;    Family History  Problem Relation Age of Onset  . Gout Mother   . Dementia Mother   . Kidney disease Mother   . Cancer Mother        kidney  . Heart attack Mother   . Heart attack Father   . Cancer Brother     Social History   Socioeconomic History  . Marital status: Married    Spouse name: Neldon Newport   . Number of children: 1  . Years of education: Not on file  . Highest education level: 12th grade  Occupational History  . 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 Financial resource strain: Not on file  . Food insecurity:    Worry: Not on file    Inability: Not on file  . Transportation needs:    Medical: Not on file    Non-medical: Not on file  Tobacco Use  . Smoking status: Former Smoker    Packs/day: 0.25    Years: 31.00    Pack years: 7.75    Types: Cigarettes    Last attempt to quit: 11/18/1997    Years since quitting: 21.2  . Smokeless tobacco: Never Used  . Tobacco comment: quit in 11/1997  Substance and Sexual Activity  . Alcohol use: No    Alcohol/week: 0.0 standard drinks  . Drug use: No  . Sexual activity: Not Currently  Lifestyle  . Physical activity:    Days per week: Not on file    Minutes per session: Not on file  .  Stress: Not on file  Relationships  . Social connections:    Talks on phone: Not on file    Gets together: Not on file    Attends religious service: Not on file    Active member of club or organization: Not on file    Attends meetings of clubs or organizations: Not on file    Relationship status: Not on file  . Intimate partner violence:    Fear of current or ex partner: Not on file    Emotionally abused: Not on file    Physically abused: Not on file    Forced sexual activity: Not on file  Other Topics Concern  . Not on file  Social History Narrative  . Not on file     Current Outpatient Medications:  .  aspirin 81 MG chewable tablet, Chew 1 tablet (81 mg total) by  mouth 2 (two) times daily., Disp: 30 tablet, Rfl: 0 .  Aspirin-Acetaminophen-Caffeine (EXCEDRIN PO), Take by mouth as needed., Disp: , Rfl:  .  Calcium Carb-Cholecalciferol (CALCIUM 600+D3 PO), Take 1 tablet by mouth 2 (two) times daily., Disp: , Rfl:  .  diclofenac sodium (VOLTAREN) 1 % GEL, Apply 1-2 g topically 4 (four) times daily as needed (for pain.)., Disp: , Rfl:  .  docusate sodium (COLACE) 100 MG capsule, Take 1 capsule (100 mg total) by mouth 2 (two) times daily., Disp: 10 capsule, Rfl: 0 .  DULoxetine (CYMBALTA) 60 MG capsule, TAKE 1 CAPSULE(60 MG) BY MOUTH DAILY, Disp: 90 capsule, Rfl: 0 .  fluticasone (FLONASE) 50 MCG/ACT nasal spray, Place 1-2 sprays into both nostrils daily as needed for allergies., Disp: , Rfl:  .  Insulin Pen Needle (NOVOFINE) 32G X 6 MM MISC, 1 each by Does not apply route every 7 (seven) days., Disp: 100 each, Rfl: 1 .  loratadine (CLARITIN) 10 MG tablet, as needed., Disp: , Rfl:  .  methotrexate (RHEUMATREX) 2.5 MG tablet, Take 17.5 mg by mouth once a week. Takes on Mondays , Disp: , Rfl:  .  metoprolol tartrate (LOPRESSOR) 100 MG tablet, Take 1 tablet (100 mg total) by mouth 2 (two) times daily., Disp: 180 tablet, Rfl: 1 .  olmesartan-hydrochlorothiazide (BENICAR HCT) 20-12.5 MG  tablet, Take 1 tablet by mouth daily., Disp: 90 tablet, Rfl: 1 .  omeprazole (PRILOSEC) 20 MG capsule, TAKE 1 CAPSULE(20 MG) BY MOUTH DAILY, Disp: 90 capsule, Rfl: 0 .  rosuvastatin (CRESTOR) 40 MG tablet, Take 1 tablet (40 mg total) by mouth every evening. In place of Atorvastatin, Disp: 90 tablet, Rfl: 1 .  Semaglutide,0.25 or 0.5MG /DOS, (OZEMPIC, 0.25 OR 0.5 MG/DOSE,) 2 MG/1.5ML SOPN, Inject 0.5 mg into the skin every 7 (seven) days. (Patient not taking: Reported on 02/21/2019), Disp: 3 pen, Rfl: 1  Allergies  Allergen Reactions  . Caduet [Amlodipine-Atorvastatin] Other (See Comments)    Makes Pt feel bad  . Cyclosporine Other (See Comments), Nausea And Vomiting and Nausea Only  . Escitalopram Other (See Comments)    I personally reviewed active problem list, medication list, allergies, notes from last encounter, lab results with the patient/caregiver today.   ROS  Constitutional: Negative for fever or weight change.  Respiratory: Negative for cough and shortness of breath.   Cardiovascular: Negative for chest pain or palpitations.  Gastrointestinal: Negative for abdominal pain, no bowel changes.  Musculoskeletal: Negative for gait problem or joint swelling.  Skin: Negative for rash.  Neurological: Negative for dizziness or headache.  No other specific complaints in a complete review of systems (except as listed in HPI above).  Objective  Vitals:   02/21/19 0713  BP: 128/84  Pulse: 74  Resp: 16  Temp: 98.3 F (36.8 C)  TempSrc: Oral  SpO2: 98%  Weight: 213 lb 6.4 oz (96.8 kg)  Height: 5\' 1"  (1.549 m)   Body mass index is 40.32 kg/m.  Physical Exam  Constitutional: Patient appears well-developed and well-nourished. No distress.  HENT: Head: Normocephalic and atraumatic.  Eyes: Conjunctivae and EOM are normal. No scleral icterus. Neck: Normal range of motion. Neck supple. No JVD present. No thyromegaly present.  Cardiovascular: Normal rate, regular rhythm and normal  heart sounds.  No murmur heard. No BLE edema. Pulmonary/Chest: Effort normal and breath sounds normal. No respiratory distress. Musculoskeletal: Normal range of motion, no joint effusions. No gross deformities Neurological: Pt is alert and oriented to person, place, and time. No  cranial nerve deficit. Coordination, balance, strength, speech and gait are normal.  Skin: Skin is warm and dry. No rash noted. No erythema.  Psychiatric: Patient has a normal mood and affect. behavior is normal. Judgment and thought content normal.  No results found for this or any previous visit (from the past 72 hour(s)).  PHQ2/9: Depression screen [facility] 2/9 02/21/2019 11/21/2018 08/22/2018 05/22/2018 01/18/2018  Decreased Interest 0 0 1 2 1   Down, Depressed, Hopeless 0 0 1 0 0  PHQ - 2 Score 0 0 2 2 1   Altered sleeping 0 0 0 2 2  Tired, decreased energy 0 0 0 3 1  Change in appetite 0 0 0 2 1  Feeling bad or failure about yourself  0 0 1 2 0  Trouble concentrating 0 0 0 0 0  Moving slowly or fidgety/restless 0 0 0 1 0  Suicidal thoughts 0 0 0 0 0  PHQ-9 Score 0 0 3 12 5   Difficult doing work/chores Not difficult at all Not difficult at all Not difficult at all Not difficult at all Somewhat difficult   PHQ-2/9 Result is negative.    Fall Risk: Fall Risk  02/21/2019 11/21/2018 08/22/2018 01/18/2018 10/13/2017  Falls in the past year? 0 0 0 No No  Number falls in past yr: 0 0 0 - -  Injury with Fall? 0 0 0 - -  Follow up Falls evaluation completed - - - -    Assessment & Plan  1. Prediabetes - Declines medication at this time.  2. Morbid obesity due to excess calories ([place]) - Discussed importance of 150 minutes of physical activity weekly, eat two servings of fish weekly, eat one serving of tree nuts ( cashews, pistachios, pecans, almonds.Marland Kitchen) every other day, eat 6 servings of fruit/vegetables daily and drink plenty of water and avoid sweet beverages.   3. Subclinical hypothyroidism - Not due for labs, symptoms are  stable  4. Dyslipidemia - Taking statin  5. Essential hypertension - Stable on current medications  6. Mitral valve insufficiency, unspecified etiology - Stable  7. Gastroesophageal reflux disease, esophagitis presence not specified - Taking Omeprazole PRN  8. Mild major depression (HCC) - busPIRone (BUSPAR) 7.5 MG tablet; Take 1 tablet each night, after 7 days, may increase to twice daily as needed.  Dispense: 30 tablet; Refill: 1  9. GAD (generalized anxiety disorder) - busPIRone (BUSPAR) 7.5 MG tablet; Take 1 tablet each night, after 7 days, may increase to twice daily as needed.  Dispense: 30 tablet; Refill: 1  10. Seropositive rheumatoid arthritis (HCC) - Seeing Dr. Meda Coffee, symptoms are controlled.  11. Primary osteoarthritis of both knees - Stable, doing well  12. Chronic kidney disease, stage III (moderate) (HCC) - Stable at last check  15. Mild major depression (HCC) - busPIRone (BUSPAR) 7.5 MG tablet; Take 1 tablet each night, after 7 days, may increase to twice daily as needed.  Dispense: 30 tablet; Refill: 1

## 2019-02-21 NOTE — Patient Instructions (Signed)
Kegel Exercises  Kegel exercises help strengthen the muscles that support the rectum, vagina, small intestine, bladder, and uterus. Doing Kegel exercises can help:   Improve bladder and bowel control.   Improve sexual response.   Reduce problems and discomfort during pregnancy.  Kegel exercises involve squeezing your pelvic floor muscles, which are the same muscles you squeeze when you try to stop the flow of urine. The exercises can be done while sitting, standing, or lying down, but it is best to vary your position.  Exercises  1. Squeeze your pelvic floor muscles tight. You should feel a tight lift in your rectal area. If you are a female, you should also feel a tightness in your vaginal area. Keep your stomach, buttocks, and legs relaxed.  2. Hold the muscles tight for up to 10 seconds.  3. Relax your muscles.  Repeat this exercise 50 times a day or as many times as told by your health care provider. Continue to do this exercise for at least 4-6 weeks or for as long as told by your health care provider.  This information is not intended to replace advice given to you by your health care provider. Make sure you discuss any questions you have with your health care provider.  Document Released: 08/22/2012 Document Revised: 01/16/2017 Document Reviewed: 07/26/2015  Elsevier Interactive Patient Education  2019 [employer]

## 2019-03-10 ENCOUNTER — Other Ambulatory Visit: Payer: Self-pay | Admitting: Family Medicine

## 2019-03-10 DIAGNOSIS — I10 Essential (primary) hypertension: Secondary | ICD-10-CM

## 2019-04-12 DIAGNOSIS — M059 Rheumatoid arthritis with rheumatoid factor, unspecified: Secondary | ICD-10-CM | POA: Diagnosis not present

## 2019-04-12 DIAGNOSIS — Z79899 Other long term (current) drug therapy: Secondary | ICD-10-CM | POA: Diagnosis not present

## 2019-04-28 IMAGING — CR DG BONE LENGTH
1 series · 5 of 5 positions shown · non-contrast
Comparison: None.

CLINICAL DATA: Left knee osteoarthritis

EXAM:
BONE LENGTH

[Series 1: dg bone length · 0.14mm/px · 5 of 5 slices shown]
[im 1/5]
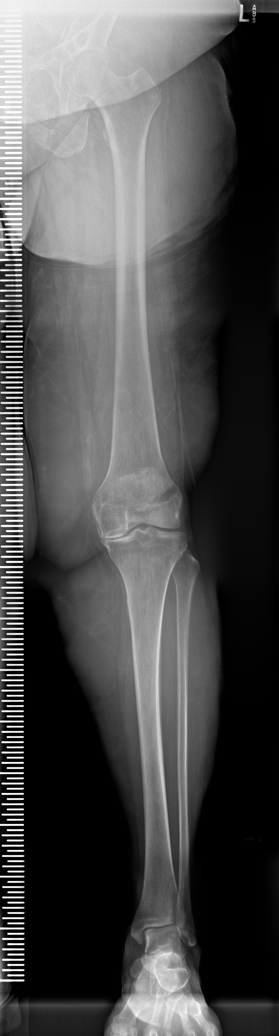
[im 2/5]
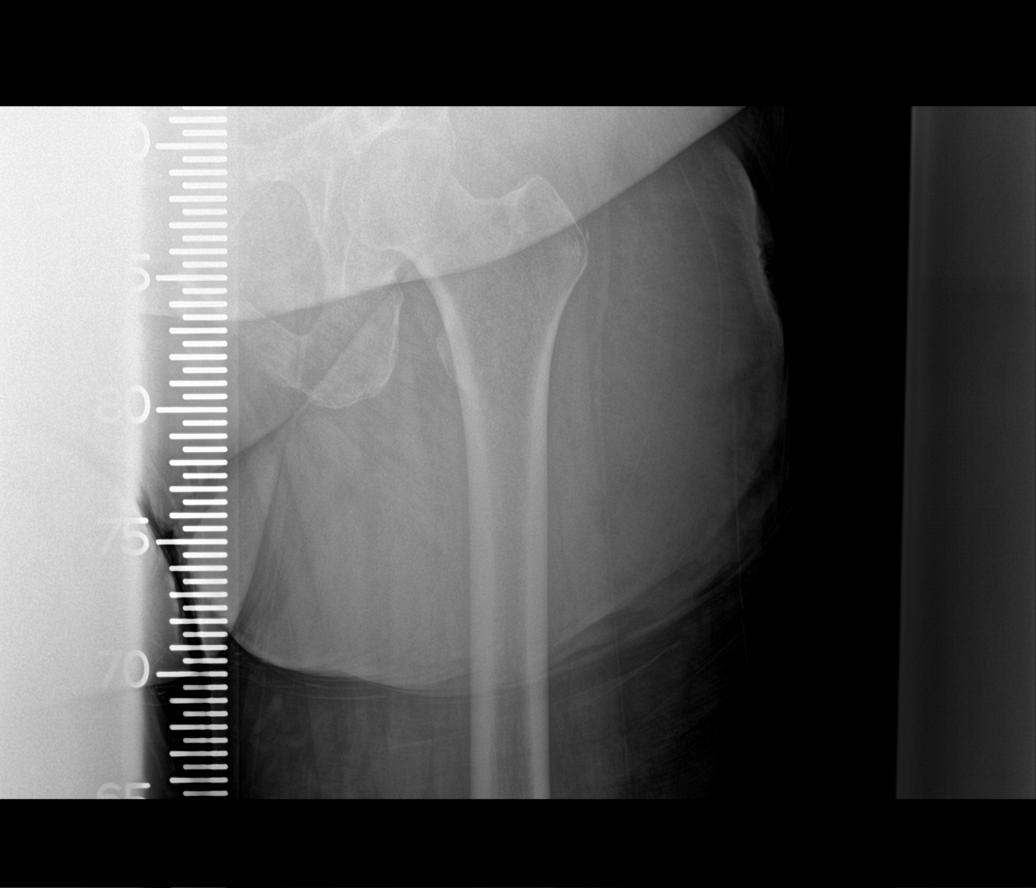
[im 3/5]
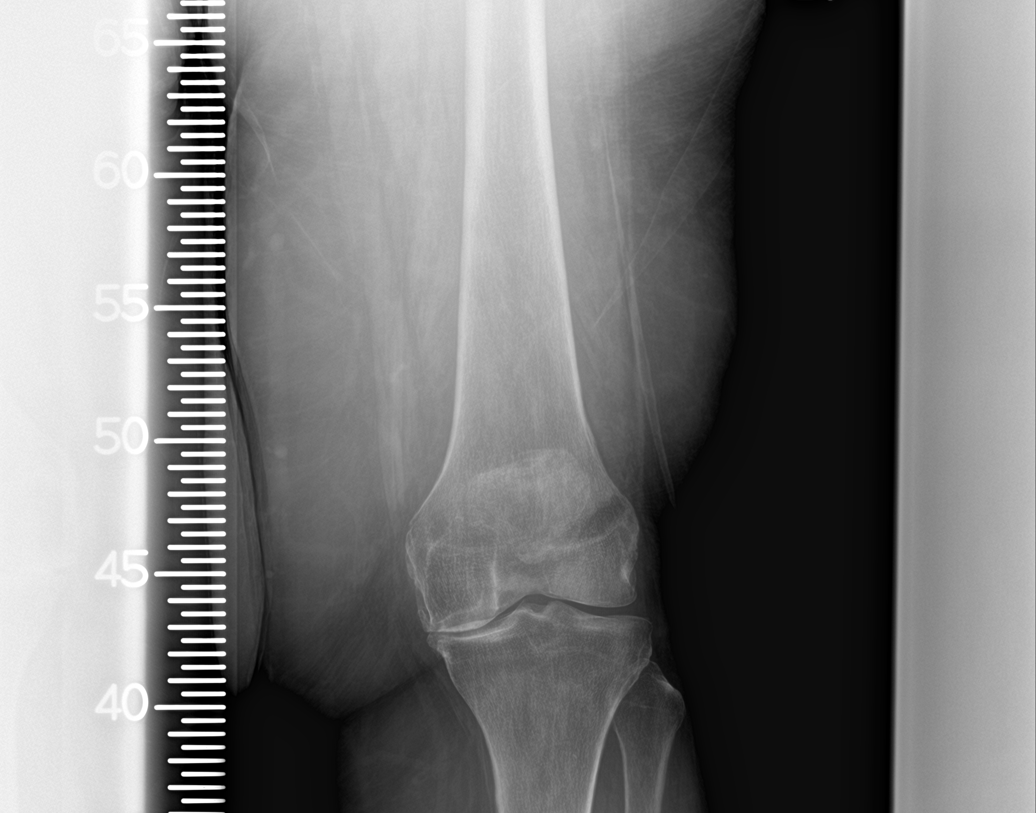
[im 4/5]
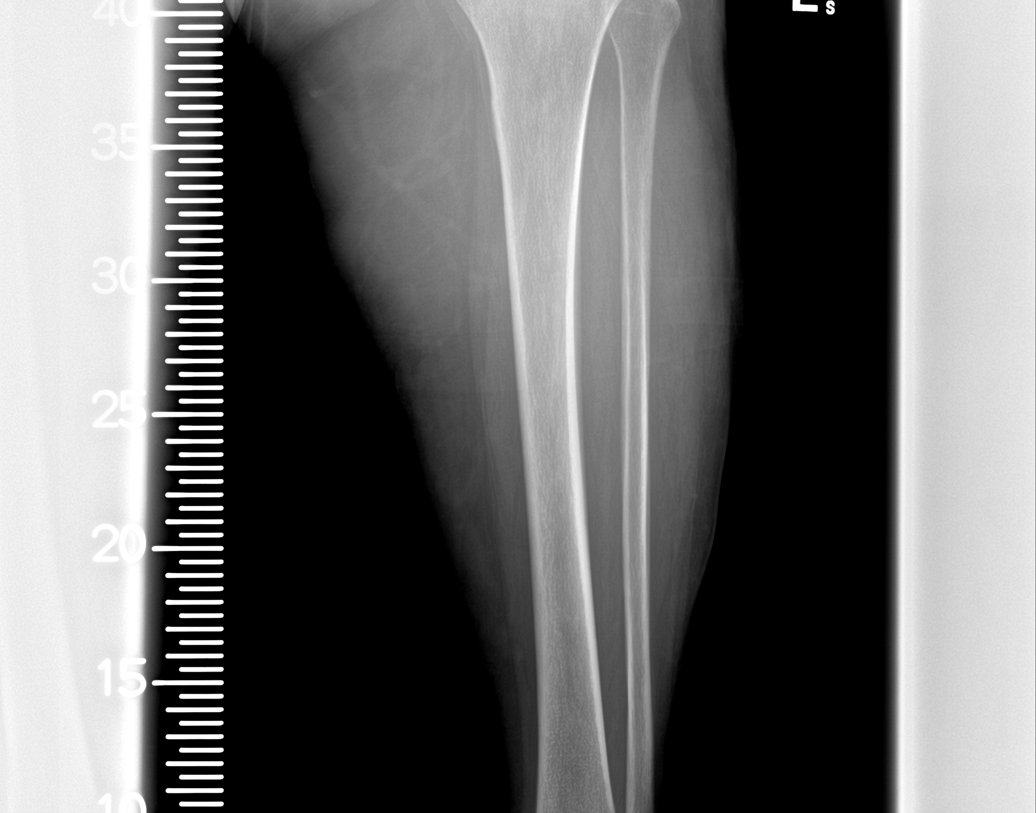
[im 5/5]
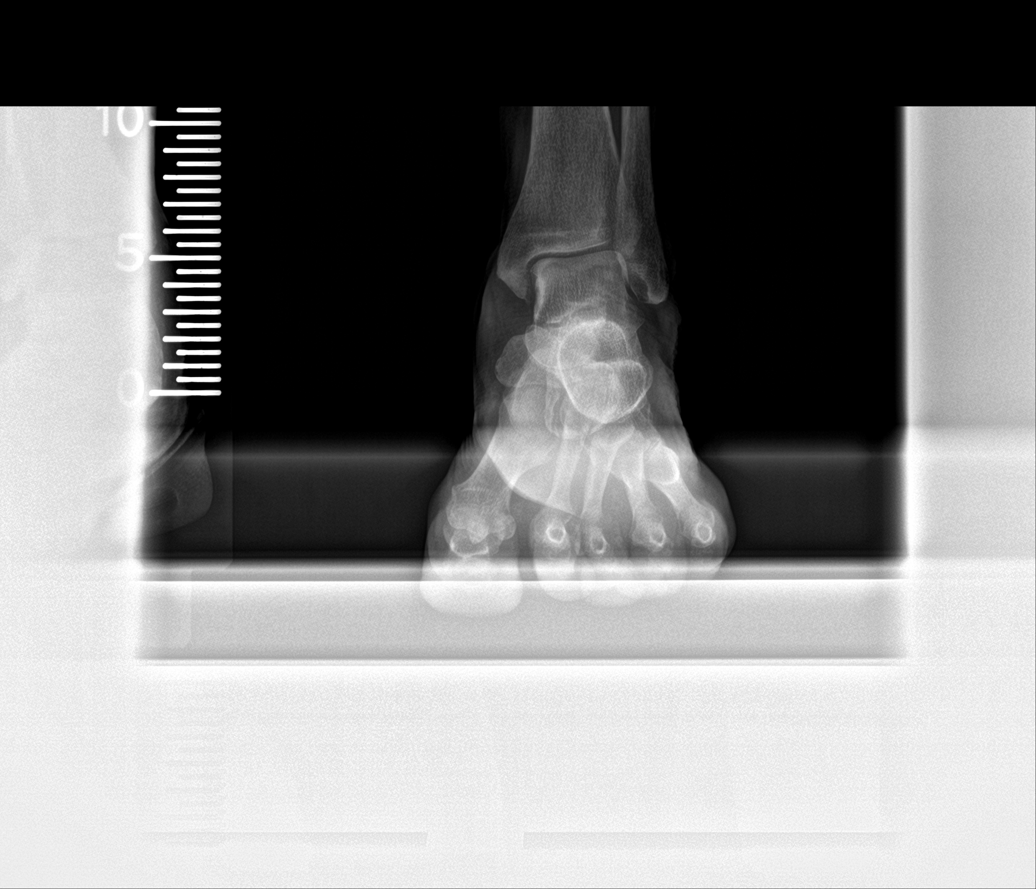

[5 of 5 positions shown; findings below may reference images not displayed]

FINDINGS: No acute fracture or dislocation.  No aggressive osseous lesion.

Left lower extremity length measures 85 cm from the articular
surface along the superior aspect of the femoral head to the mid
tibial plafond. Slight varus angulation at the knee.

Moderate-severe medial femorotibial compartment joint space
narrowing consistent with osteoarthritis with small marginal
osteophytes.

No soft tissue abnormality.
IMPRESSION: 1. No osseous abnormality of left lower extremity.

## 2019-05-12 IMAGING — CR DG CHEST 2V
2 series · 2 of 2 positions shown · non-contrast
Comparison: None.

CLINICAL DATA: Preoperative respiratory evaluation.

EXAM:
CHEST - 2 VIEW

[chest pa]
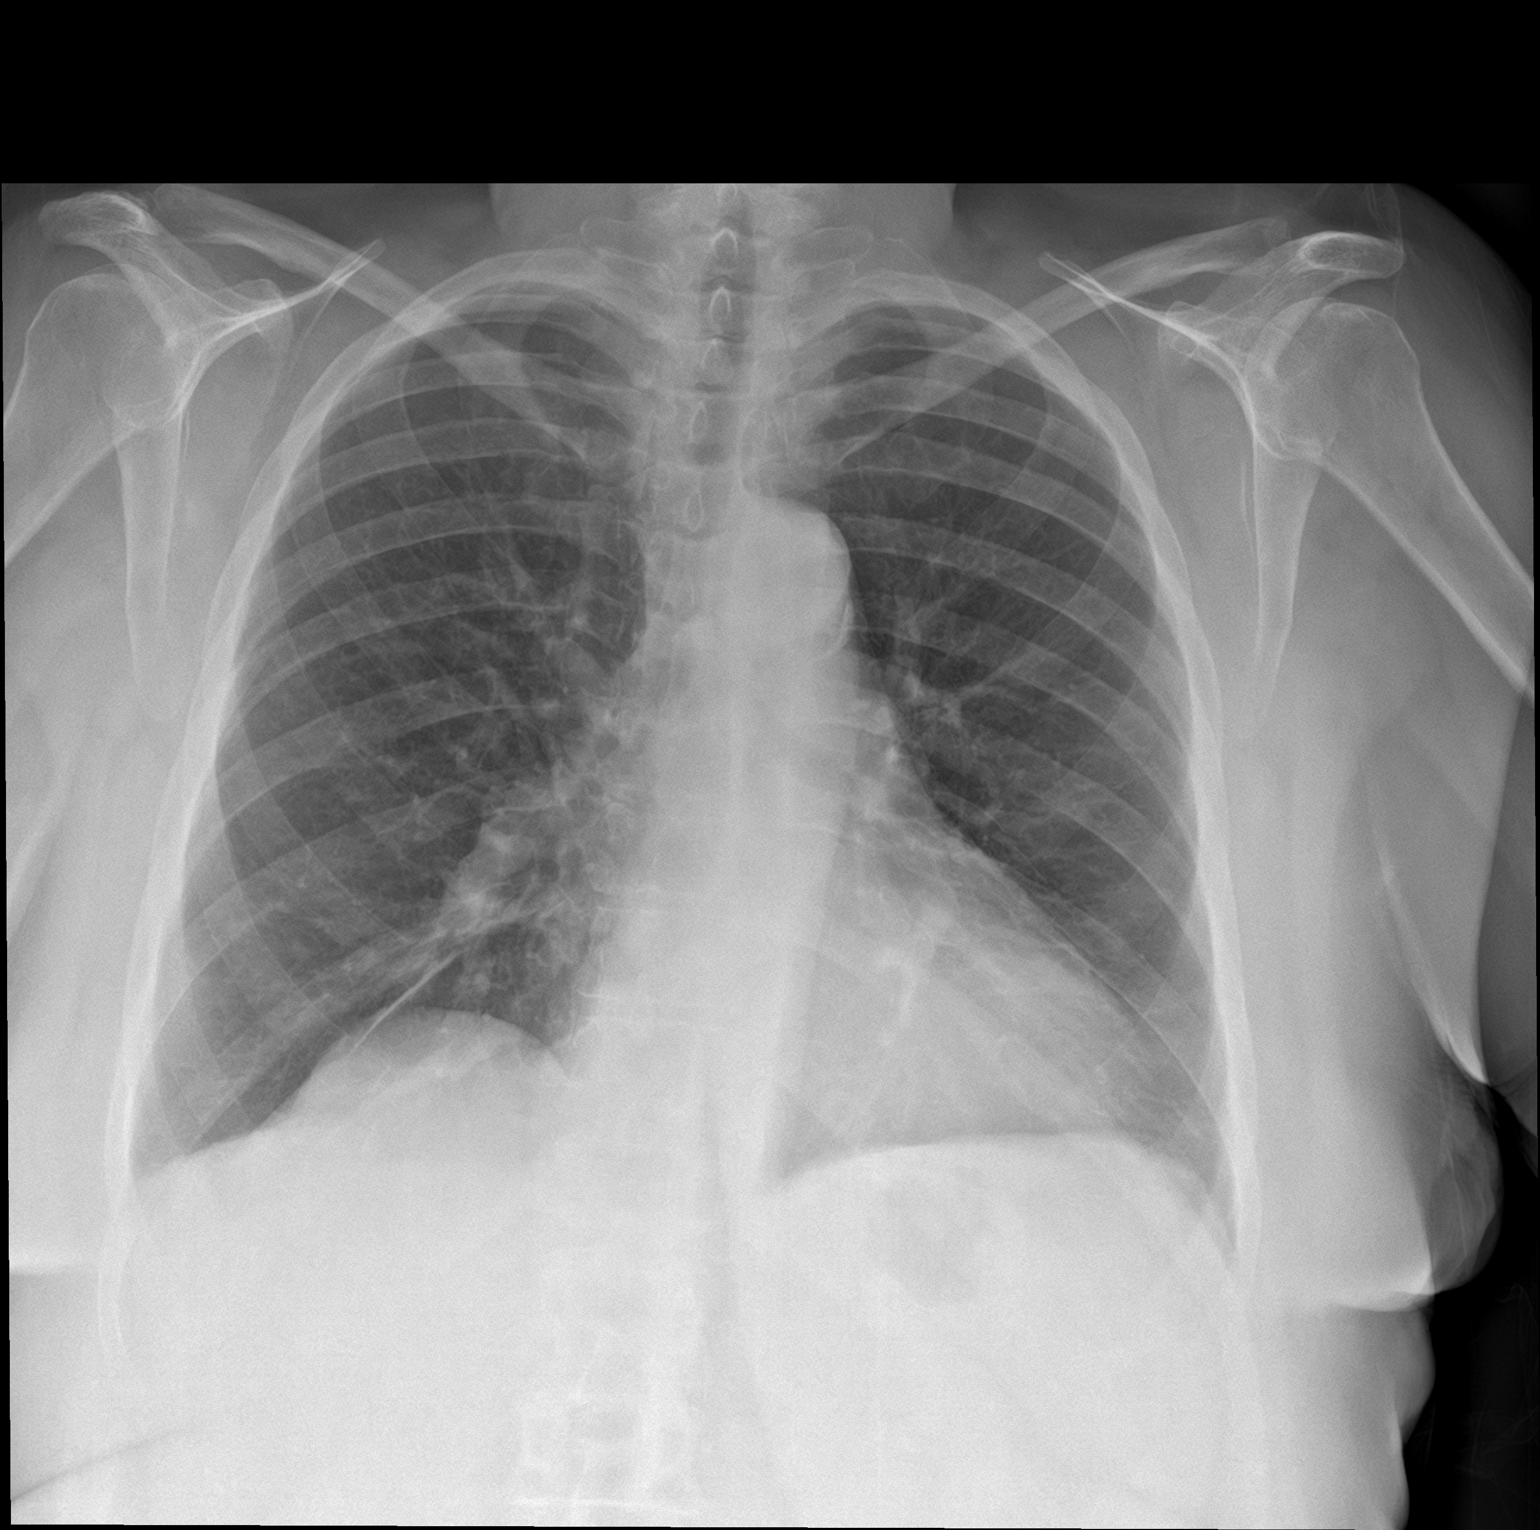

[chest lat]
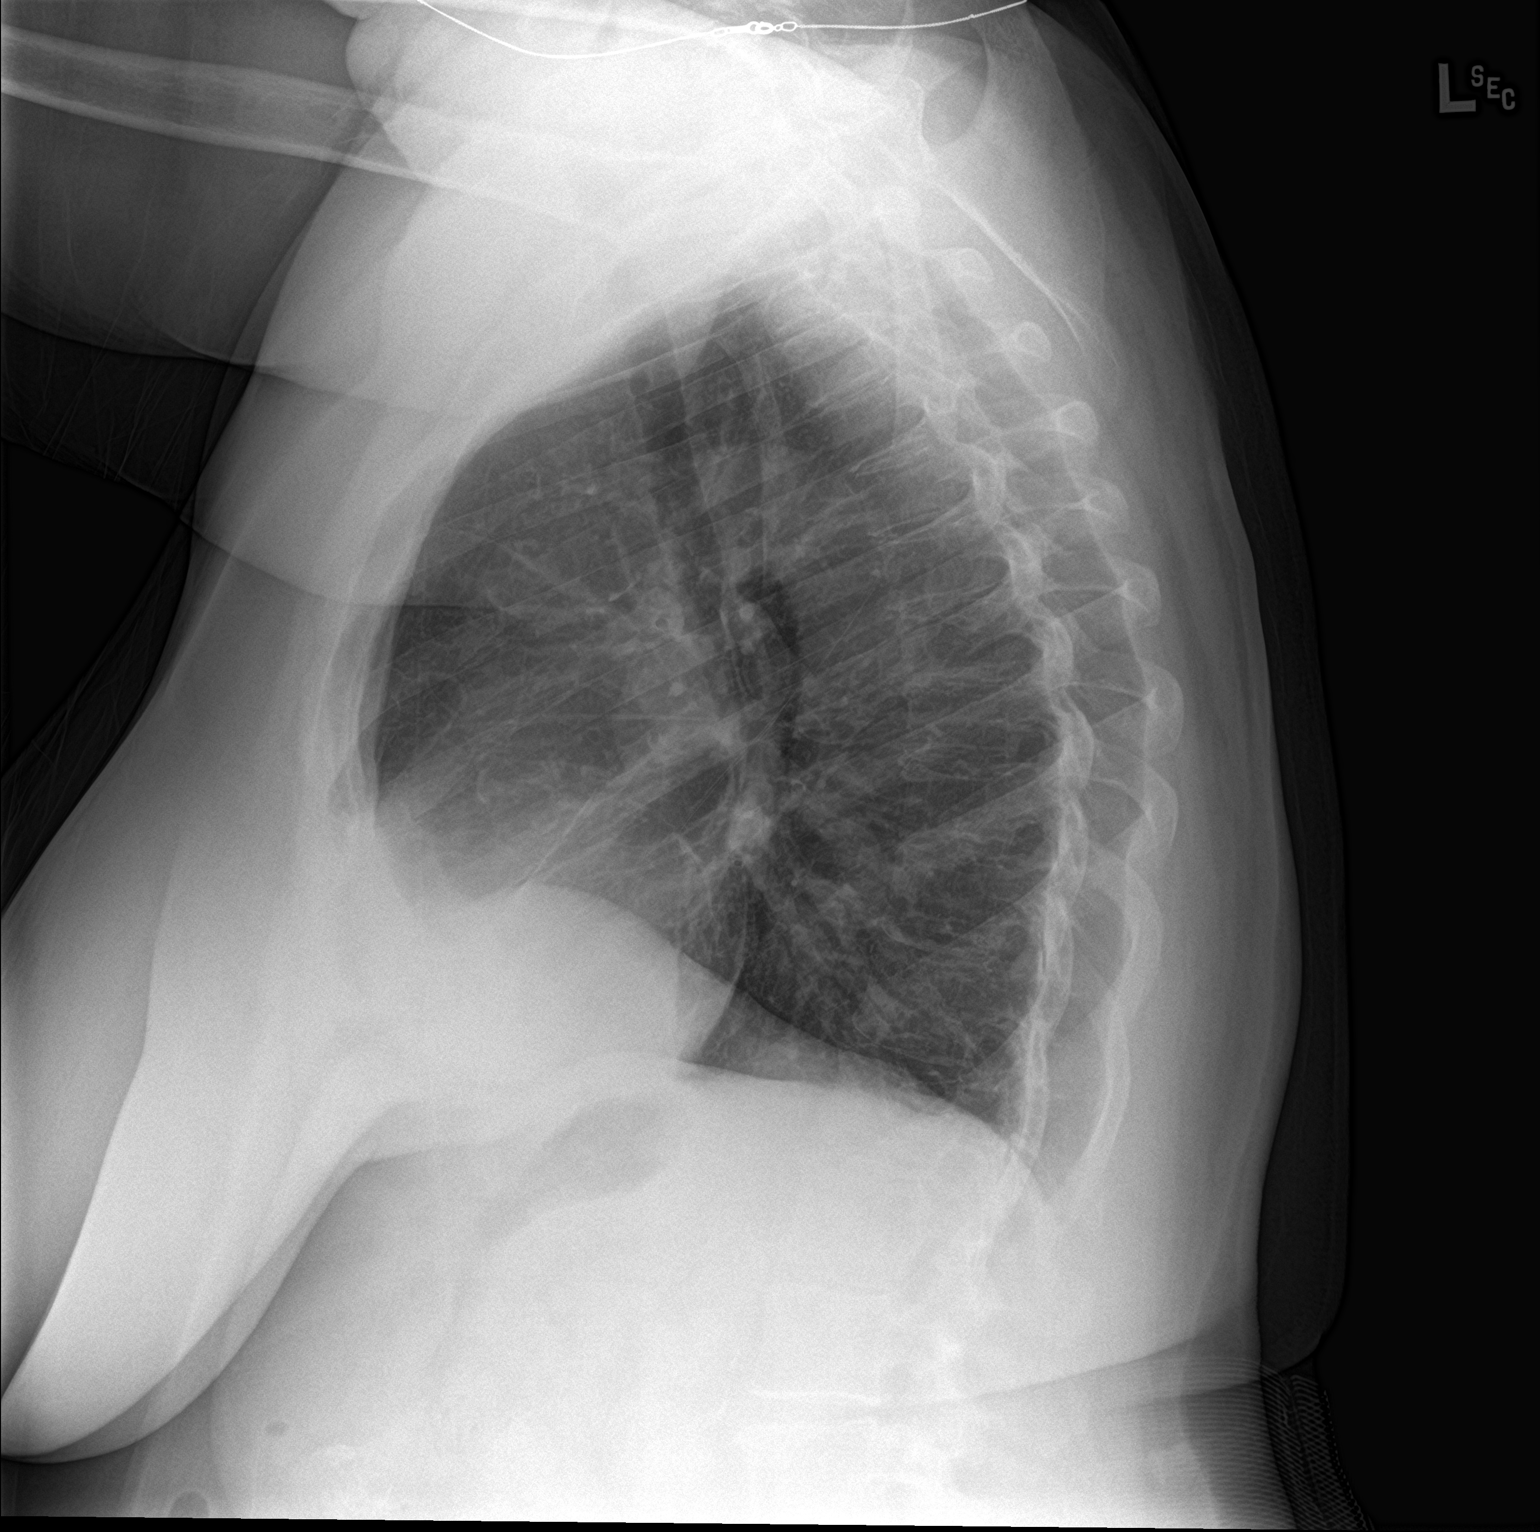

[2 of 2 positions shown; findings below may reference images not displayed]

FINDINGS: The lungs are clear without focal pneumonia, edema, pneumothorax or
pleural effusion. Linear subsegmental atelectasis or scar noted in
the infrahilar right lung base. The cardiopericardial silhouette is
within normal limits for size. The visualized bony structures of the
thorax are intact.
IMPRESSION: No active cardiopulmonary disease.

## 2019-05-16 DIAGNOSIS — Z79899 Other long term (current) drug therapy: Secondary | ICD-10-CM | POA: Diagnosis not present

## 2019-05-16 DIAGNOSIS — M170 Bilateral primary osteoarthritis of knee: Secondary | ICD-10-CM | POA: Diagnosis not present

## 2019-05-16 DIAGNOSIS — M059 Rheumatoid arthritis with rheumatoid factor, unspecified: Secondary | ICD-10-CM | POA: Diagnosis not present

## 2019-05-16 DIAGNOSIS — E669 Obesity, unspecified: Secondary | ICD-10-CM | POA: Diagnosis not present

## 2019-05-24 IMAGING — DX DG KNEE 1-2V PORT*L*
2 series · 2 of 2 positions shown · non-contrast
Comparison: 06/24/2015 and MR dated 01/20/2018.

CLINICAL DATA: Left total knee replacement.

EXAM:
PORTABLE LEFT KNEE - 1-2 VIEW

[knee ap]
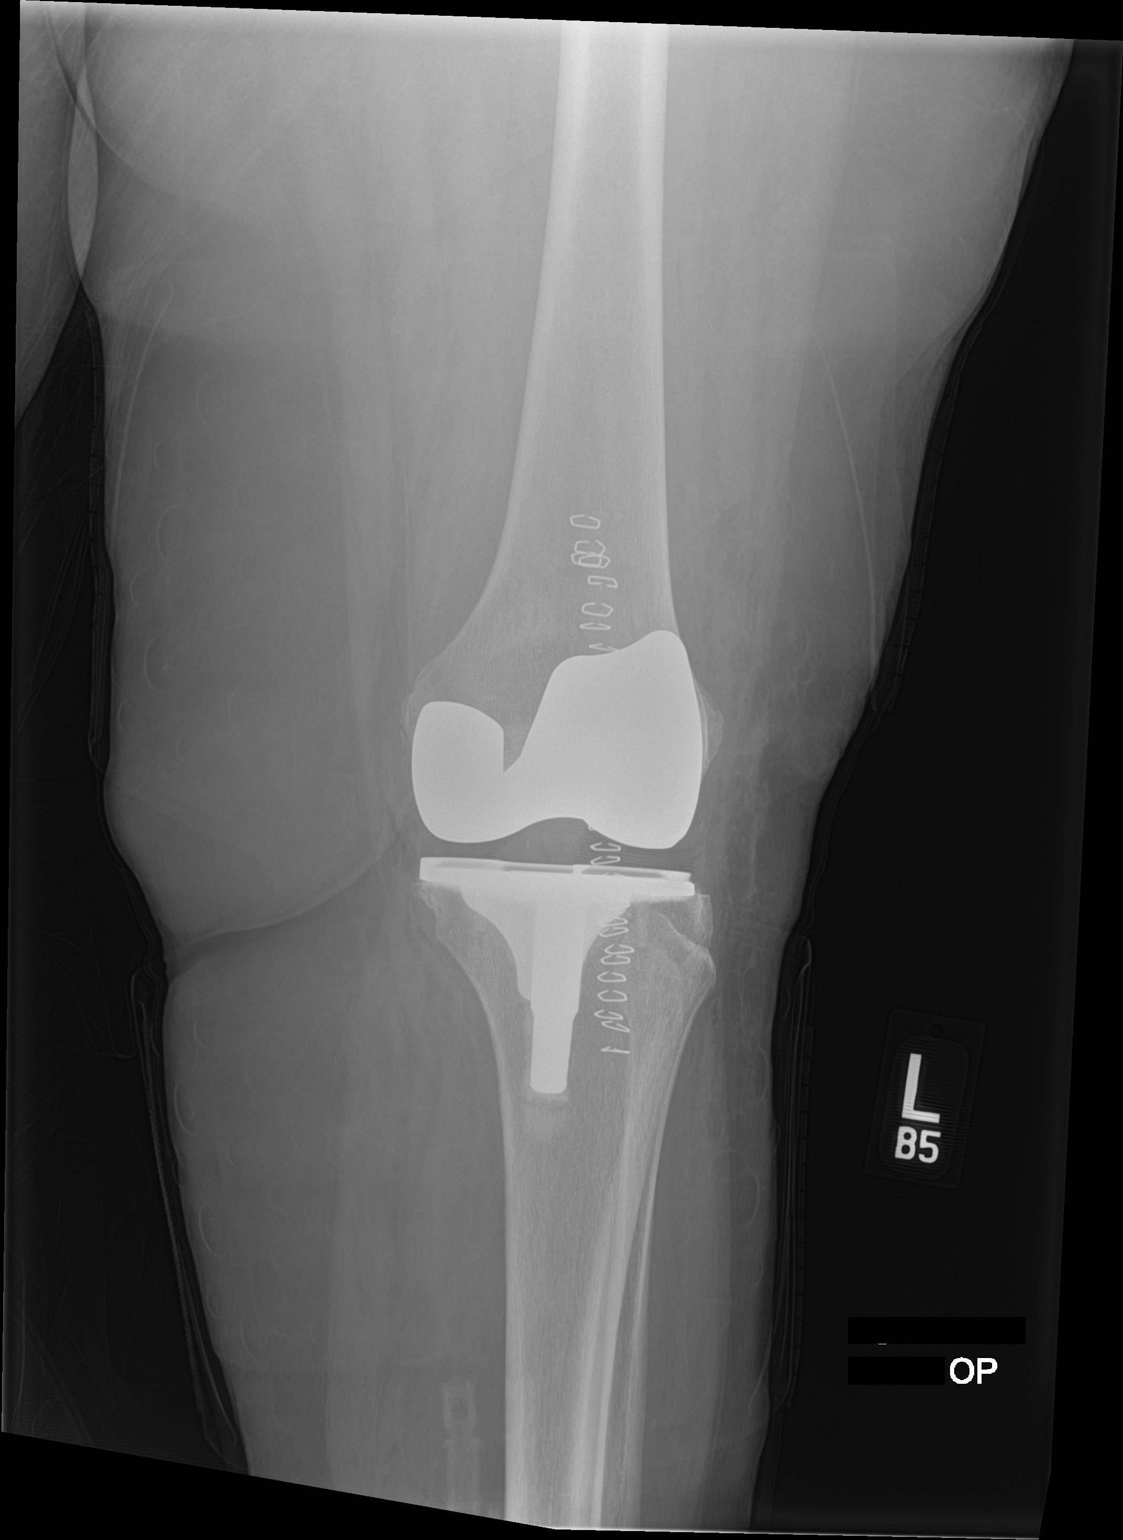

[knee lat]
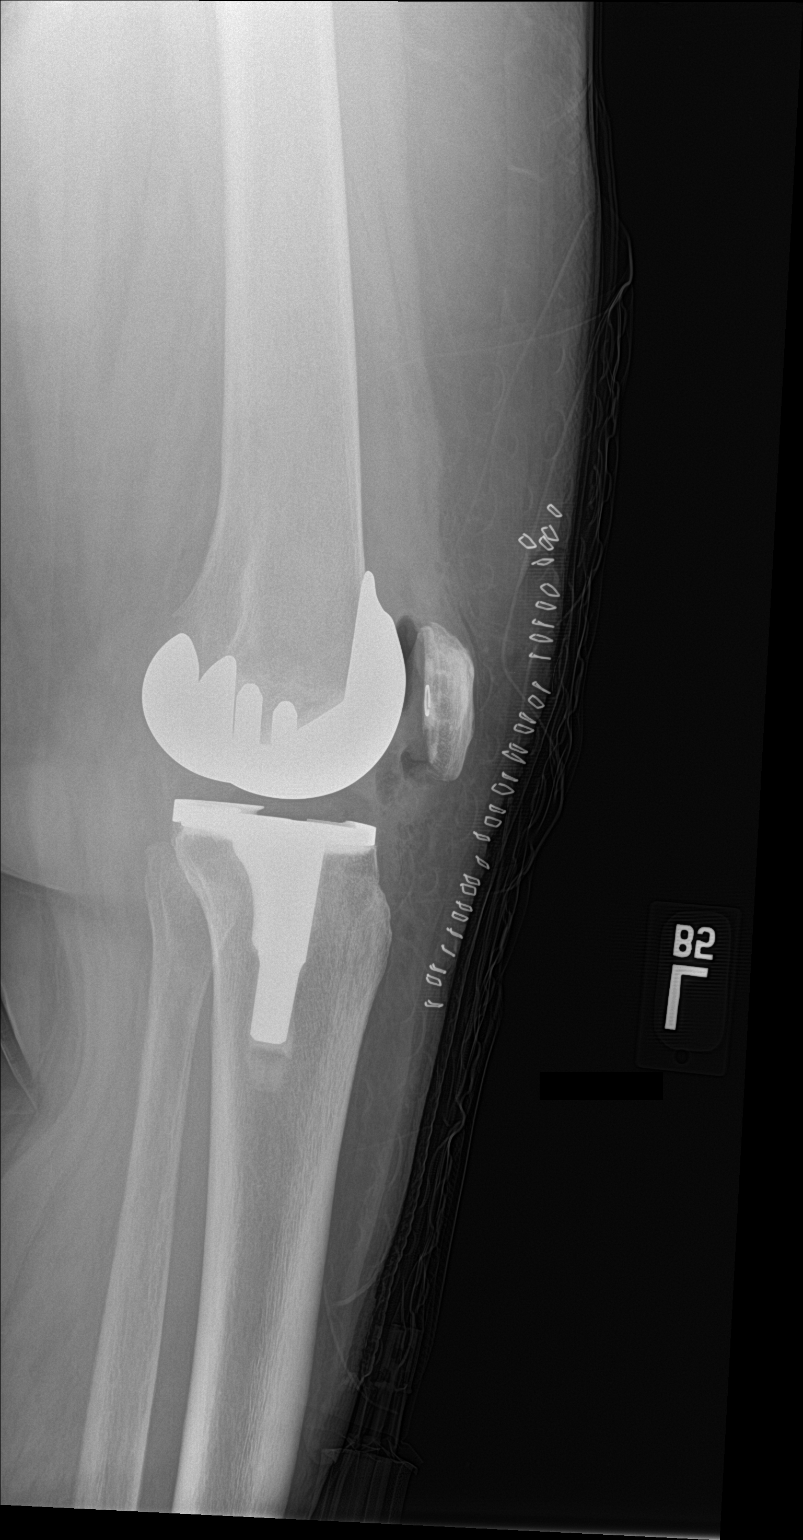

[2 of 2 positions shown; findings below may reference images not displayed]

FINDINGS: Interval left total knee prosthesis in satisfactory position and
alignment. No fracture or dislocation.
IMPRESSION: Satisfactory postoperative appearance of a left total knee
prosthesis.

## 2019-06-02 ENCOUNTER — Other Ambulatory Visit: Payer: Self-pay | Admitting: Family Medicine

## 2019-06-02 DIAGNOSIS — K219 Gastro-esophageal reflux disease without esophagitis: Secondary | ICD-10-CM

## 2019-06-24 ENCOUNTER — Other Ambulatory Visit: Payer: Self-pay

## 2019-06-24 ENCOUNTER — Encounter: Payer: Self-pay | Admitting: Family Medicine

## 2019-06-24 ENCOUNTER — Ambulatory Visit (INDEPENDENT_AMBULATORY_CARE_PROVIDER_SITE_OTHER): Payer: Medicare Other | Admitting: Family Medicine

## 2019-06-24 VITALS — BP 128/80 | HR 60 | Temp 97.3°F | Resp 16 | Ht 61.0 in | Wt 231.5 lb

## 2019-06-24 DIAGNOSIS — F411 Generalized anxiety disorder: Secondary | ICD-10-CM

## 2019-06-24 DIAGNOSIS — I10 Essential (primary) hypertension: Secondary | ICD-10-CM | POA: Diagnosis not present

## 2019-06-24 DIAGNOSIS — Z23 Encounter for immunization: Secondary | ICD-10-CM | POA: Diagnosis not present

## 2019-06-24 DIAGNOSIS — N183 Chronic kidney disease, stage 3 unspecified: Secondary | ICD-10-CM

## 2019-06-24 DIAGNOSIS — M059 Rheumatoid arthritis with rheumatoid factor, unspecified: Secondary | ICD-10-CM | POA: Diagnosis not present

## 2019-06-24 DIAGNOSIS — E038 Other specified hypothyroidism: Secondary | ICD-10-CM

## 2019-06-24 DIAGNOSIS — R7303 Prediabetes: Secondary | ICD-10-CM | POA: Diagnosis not present

## 2019-06-24 DIAGNOSIS — E785 Hyperlipidemia, unspecified: Secondary | ICD-10-CM | POA: Diagnosis not present

## 2019-06-24 DIAGNOSIS — E039 Hypothyroidism, unspecified: Secondary | ICD-10-CM

## 2019-06-24 DIAGNOSIS — F320 Major depressive disorder, single episode, mild: Secondary | ICD-10-CM

## 2019-06-24 DIAGNOSIS — M170 Bilateral primary osteoarthritis of knee: Secondary | ICD-10-CM | POA: Diagnosis not present

## 2019-06-24 DIAGNOSIS — I340 Nonrheumatic mitral (valve) insufficiency: Secondary | ICD-10-CM

## 2019-06-24 MED ORDER — METFORMIN HCL ER 750 MG PO TB24: 750.0000 mg | ORAL_TABLET | Freq: Every day | ORAL | 1 refills | Status: DC

## 2019-06-24 NOTE — Progress Notes (Signed)
Name: Hayley Simmons   MRN: [id number]    DOB: 1946/05/25   Date:06/24/2019       Progress Note  Subjective  Chief Complaint  Chief Complaint  Patient presents with  . Hypertension    3 month follow up  . Hyperlipidemia    HPI  Prediabetes: diagnosed July 2018, she has never been on medication. Denies polyphagia, polydipsia or polyuria - except at night she is getting up to urinate about twice.Last A1C was normal 3 mos ago. Due for labs today  Morbid obesity: She has gained18lbs since last visit - she asks about diet medications, however, I advised that stimulant medication and/or wellbutrin would not be a safe option for her due to her age and mitral incompetence.  Discussed morbid obesity.  She stopped ozempic after a few weeks because she didn't loose weight - explained need for longer use of medication; also discussed other options - she is not a candidate for stimulant therapy due to HTN and anxiety.  Not a candidate for Wellbutrin due to anxiety.  She is willing to try metformin today.  Hxsubclinical hypothyroidism: last check was normal55mos ago,denies palpitations,is gaining weight, denies hair/skin/nail changes or constiaption/diarrhea.  Dyslipidemia:On Crestor nowinstead of Lipitor, denies side effects of medication- no myalgias, chest pain, or shortness of breath.Taking 1 baby aspirin daily.Last LDL was at [facility] March 2020 and LDL was 92. Due for labs again today. Stable.  HTN/Mitral Incompetence: bpis at goal today.Taking Lopressor 100mg  BID and Benicar 20-12.5. No chest pain or palpitation, no vision changes, no BLE edema, lightheadedness or dizziness.Has murmur from Mitral Incompetence (dx'd at age 84), but does not see cardiology. No changes; home BP's have been normal as well.   GERD: doing better, takingomeprazole only as needed now - up to 2 times a week;discussed long term risk of PPI, such as dementia, osteoporosis, colitis and heart  disease.She avoids triggers.  She denies abdominal pain, changes in BM's, blood in stool, dark and tarry stool, no difficulty swallowing.  Major Depression/Anxiety: Going on for years, struggles with relationship with her daughter, and it makes her sad.She recently lost her brother on 08/03/2018 - he had cancer, but was doing well, then passed suddenly. She is off of Xanax now. She has not been taking Cymbalta daily - she will try going back on this daily to help with mood and overeating.  Taking buspar very PRN.   Office Visit from 06/24/2019 in [facility]  PHQ-9 Total Score  0     Rheumatoid Arthritis: Typical symptoms aching and sometimes swelling on both wrists, ankles, hands, and top of her feet. No redness or increase in warmth. She is now seeing Rheumatology and this is going well now - she is compliant with her methotrexate and is pain free today. No concerns today, stable and doing very well.  Patient Active Problem List   Diagnosis Date Noted  . Mild major depression ([place]) 11/21/2018  . Dyslipidemia 08/22/2018  . GAD (generalized anxiety disorder) 08/22/2018  . Immunocompromised state due to drug therapy 08/22/2018  . Multiple polyps of sigmoid colon 08/22/2018  . Seropositive rheumatoid arthritis ([place]) 06/13/2018  . Total knee replacement status, left 02/19/2018  . Chronic kidney disease, stage III (moderate) 01/23/2018  . MI (mitral incompetence) 01/18/2018  . Prediabetes 06/21/2016  . Elevated TSH 05/19/2016  . Osteoarthritis of both knees 06/24/2015  . Female stress incontinence 06/24/2015  . Essential hypertension 05/19/2015  . GERD (gastroesophageal reflux disease) 05/19/2015  .  Hyperlipidemia 05/19/2015  . At risk for falling 05/19/2015  . Obesity, morbid ([place]) 05/19/2015    Past Surgical History:  Procedure Laterality Date  . ABDOMINAL HYSTERECTOMY  1994  . CARPAL TUNNEL RELEASE Right 07/29/2008  . COLONOSCOPY WITH PROPOFOL N/A  10/10/2018   Procedure: COLONOSCOPY WITH PROPOFOL;  Surgeon: Lin Landsman, MD;  Location: [place];  Service: Endoscopy;  Laterality: N/A;  . FOOT SURGERY Right    bunion  . POLYPECTOMY  10/10/2018   Procedure: POLYPECTOMY;  Surgeon: Lin Landsman, MD;  Location: [place];  Service: Endoscopy;;  . TOTAL KNEE ARTHROPLASTY Left 02/19/2018   Procedure: TOTAL KNEE ARTHROPLASTY;  Surgeon: Lovell Sheehan, MD;  Location: [facility];  Service: Orthopedics;  Laterality: Left;    Family History  Problem Relation Age of Onset  . Gout Mother   . Dementia Mother   . Kidney disease Mother   . Cancer Mother        kidney  . Heart attack Mother   . Heart attack Father   . Cancer Brother     Social History   Socioeconomic History  . Marital status: Married    Spouse name: Neldon Newport   . Number of children: 1  . Years of education: Not on file  . Highest education level: 12th grade  Occupational History  . 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 Financial resource strain: Not on file  . Food insecurity    Worry: Not on file    Inability: Not on file  . Transportation needs    Medical: Not on file    Non-medical: Not on file  Tobacco Use  . Smoking status: Former Smoker    Packs/day: 0.25    Years: 31.00    Pack years: 7.75    Types: Cigarettes    Quit date: 11/18/1997    Years since quitting: 21.6  . Smokeless tobacco: Never Used  . Tobacco comment: quit in 11/1997  Substance and Sexual Activity  . Alcohol use: No    Alcohol/week: 0.0 standard drinks  . Drug use: No  . Sexual activity: Not Currently  Lifestyle  . Physical activity    Days per week: Not on file    Minutes per session: Not on file  . Stress: Not on file  Relationships  . Social Herbalist on phone: Not on file    Gets together: Not on file    Attends religious service: Not on file    Active  member of club or organization: Not on file    Attends meetings of clubs or organizations: Not on file    Relationship status: Not on file  . Intimate partner violence    Fear of current or ex partner: Not on file    Emotionally abused: Not on file    Physically abused: Not on file    Forced sexual activity: Not on file  Other Topics Concern  . Not on file  Social History Narrative  . Not on file     Current Outpatient Medications:  .  aspirin 81 MG chewable tablet, Chew 1 tablet (81 mg total) by mouth 2 (two) times daily., Disp: 30 tablet, Rfl: 0 .  busPIRone (BUSPAR) 7.5 MG tablet, Take 1 tablet each night, after 7 days, may increase to twice daily as needed., Disp: 30 tablet, Rfl: 1 .  Calcium Carb-Cholecalciferol (CALCIUM 600+D3 PO), Take 1 tablet by mouth 2 (two) times daily., Disp: , Rfl:  .  diclofenac sodium (VOLTAREN) 1 % GEL, Apply 1-2 g topically 4 (four) times daily as needed (for pain.)., Disp: , Rfl:  .  docusate sodium (COLACE) 100 MG capsule, Take 1 capsule (100 mg total) by mouth 2 (two) times daily., Disp: 10 capsule, Rfl: 0 .  DULoxetine (CYMBALTA) 60 MG capsule, TAKE 1 CAPSULE(60 MG) BY MOUTH DAILY, Disp: 90 capsule, Rfl: 0 .  fluticasone (FLONASE) 50 MCG/ACT nasal spray, Place 1-2 sprays into both nostrils daily as needed for allergies., Disp: , Rfl:  .  Insulin Pen Needle (NOVOFINE) 32G X 6 MM MISC, 1 each by Does not apply route every 7 (seven) days., Disp: 100 each, Rfl: 1 .  loratadine (CLARITIN) 10 MG tablet, as needed., Disp: , Rfl:  .  methotrexate (RHEUMATREX) 2.5 MG tablet, Take 17.5 mg by mouth once a week. Takes on Mondays , Disp: , Rfl:  .  metoprolol tartrate (LOPRESSOR) 100 MG tablet, TAKE 1 TABLET(100 MG) BY MOUTH TWICE DAILY, Disp: 180 tablet, Rfl: 1 .  olmesartan-hydrochlorothiazide (BENICAR HCT) 20-12.5 MG tablet, Take 1 tablet by mouth daily., Disp: 90 tablet, Rfl: 1 .  omeprazole (PRILOSEC) 20 MG capsule, TAKE 1 CAPSULE(20 MG) BY MOUTH DAILY, Disp:  90 capsule, Rfl: 1 .  Aspirin-Acetaminophen-Caffeine (EXCEDRIN PO), Take by mouth as needed., Disp: , Rfl:  .  rosuvastatin (CRESTOR) 40 MG tablet, Take 1 tablet (40 mg total) by mouth every evening. In place of Atorvastatin, Disp: 90 tablet, Rfl: 1  Allergies  Allergen Reactions  . Caduet [Amlodipine-Atorvastatin] Other (See Comments)    Makes Pt feel bad  . Cyclosporine Other (See Comments), Nausea And Vomiting and Nausea Only  . Escitalopram Other (See Comments)    I personally reviewed active problem list, medication list, allergies, health maintenance, notes from last encounter, lab results with the patient/caregiver today.   ROS  Ten systems reviewed and is negative except as mentioned in HPI  Objective  Vitals:   06/24/19 0802  BP: 128/80  Pulse: 60  Resp: 16  Temp: (!) 97.3 F (36.3 C)  TempSrc: Oral  SpO2: 97%  Weight: 231 lb 8 oz (105 kg)  Height: 5\' 1"  (1.549 m)    Body mass index is 43.74 kg/m.  Physical Exam  Constitutional: Patient appears well-developed and well-nourished. No distress.  HENT: Head: Normocephalic and atraumatic.  Eyes: Conjunctivae and EOM are normal. No scleral icterus.   Neck: Normal range of motion. Neck supple. No JVD present. Cardiovascular: Normal rate, regular rhythm and normal heart sounds.  No murmur heard. No BLE edema. Pulmonary/Chest: Effort normal and breath sounds normal. No respiratory distress. Musculoskeletal: Normal range of motion, no joint effusions. No gross deformities Neurological: Pt is alert and oriented to person, place, and time. No cranial nerve deficit. Coordination, balance, strength, speech and gait are normal.  Skin: Skin is warm and dry. No rash noted. No erythema.  Psychiatric: Patient has a normal mood and affect. behavior is normal. Judgment and thought content normal.  No results found for this or any previous visit (from the past 72 hour(s)).  PHQ2/9: Depression screen [facility] 2/9 06/24/2019 02/21/2019  11/21/2018 08/22/2018 05/22/2018  Decreased Interest 0 0 0 1 2  Down, Depressed, Hopeless 0 0 0 1 0  PHQ - 2 Score 0 0 0 2 2  Altered sleeping 0 0 0 0 2  Tired, decreased energy 0 0 0 0  3  Change in appetite 0 0 0 0 2  Feeling bad or failure about yourself  0 0 0 1 2  Trouble concentrating 0 0 0 0 0  Moving slowly or fidgety/restless 0 0 0 0 1  Suicidal thoughts 0 0 0 0 0  PHQ-9 Score 0 0 0 3 12  Difficult doing work/chores Not difficult at all Not difficult at all Not difficult at all Not difficult at all Not difficult at all  Some recent data might be hidden   PHQ-2/9 Result is negative.    Fall Risk: Fall Risk  06/24/2019 02/21/2019 11/21/2018 08/22/2018 01/18/2018  Falls in the past year? 0 0 0 0 No  Number falls in past yr: 0 0 0 0 -  Injury with Fall? 0 0 0 0 -  Follow up Falls evaluation completed Falls evaluation completed - - -   Assessment & Plan  1. Prediabetes - metFORMIN (GLUCOPHAGE XR) 750 MG 24 hr tablet; Take 1 tablet (750 mg total) by mouth daily with breakfast.  Dispense: 90 tablet; Refill: 1 - COMPLETE METABOLIC PANEL WITH GFR - Hemoglobin A1c  2. Morbid obesity due to excess calories (HCC) - metFORMIN (GLUCOPHAGE XR) 750 MG 24 hr tablet; Take 1 tablet (750 mg total) by mouth daily with breakfast.  Dispense: 90 tablet; Refill: 1 - COMPLETE METABOLIC PANEL WITH GFR - Discussed importance of 150 minutes of physical activity weekly, eat two servings of fish weekly, eat one serving of tree nuts ( cashews, pistachios, pecans, almonds.Marland Kitchen) every other day, eat 6 servings of fruit/vegetables daily and drink plenty of water and avoid sweet beverages.   3. Subclinical hypothyroidism - TSH  4. Dyslipidemia - Lipid panel  5. Essential hypertension - Stable and at goal - COMPLETE METABOLIC PANEL WITH GFR  6. Stage 3 chronic kidney disease, unspecified whether stage 3a or 3b CKD - COMPLETE METABOLIC PANEL WITH GFR  7. Mitral valve insufficiency, unspecified etiology -  Continue beta blocker  8. Mild major depression ([place]) - Encouraged her to take her Cymbalta daily  9. GAD (generalized anxiety disorder) - Encouraged her to take her Cymbalta daily  10. Seropositive rheumatoid arthritis ([place]) - Seeing rheumatology  11. Primary osteoarthritis of both knees - Encouraged her to take her Cymbalta daily  12. Need for influenza vaccination - Flu Vaccine QUAD High Dose(Fluad)

## 2019-06-24 NOTE — Progress Notes (Deleted)
26

## 2019-06-25 LAB — COMPLETE METABOLIC PANEL WITH GFR
AG Ratio: 1.5 (calc) (ref 1.0–2.5)
ALT: 14 U/L (ref 6–29)
AST: 17 U/L (ref 10–35)
Albumin: 3.8 g/dL (ref 3.6–5.1)
Alkaline phosphatase (APISO): 112 U/L (ref 37–153)
BUN: 17 mg/dL (ref 7–25)
CO2: 29 mmol/L (ref 20–32)
Calcium: 8.7 mg/dL (ref 8.6–10.4)
Chloride: 105 mmol/L (ref 98–110)
Creat: 0.91 mg/dL (ref 0.60–0.93)
GFR, Est African American: 74 mL/min/{1.73_m2} (ref >=60)
GFR, Est Non African American: 64 mL/min/{1.73_m2} (ref >=60)
Globulin: 2.5 g/dL (calc) (ref 1.9–3.7)
Glucose, Bld: 88 mg/dL (ref 65–99)
Potassium: 4.4 mmol/L (ref 3.5–5.3)
Sodium: 142 mmol/L (ref 135–146)
Total Bilirubin: 0.5 mg/dL (ref 0.2–1.2)
Total Protein: 6.3 g/dL (ref 6.1–8.1)

## 2019-06-25 LAB — TSH: TSH: 4.52 mIU/L — ABNORMAL HIGH (ref 0.40–4.50)

## 2019-06-25 LAB — LIPID PANEL
Cholesterol: 152 mg/dL (ref <200)
HDL: 41 mg/dL — ABNORMAL LOW (ref >=50)
LDL Cholesterol (Calc): 83 mg/dL (calc)
Non-HDL Cholesterol (Calc): 111 mg/dL (calc) (ref <130)
Total CHOL/HDL Ratio: 3.7 (calc) (ref <5.0)
Triglycerides: 179 mg/dL — ABNORMAL HIGH (ref <150)

## 2019-06-25 LAB — HEMOGLOBIN A1C
Hgb A1c MFr Bld: 5.4 % of total Hgb (ref <5.7)
Mean Plasma Glucose: 108 (calc)
eAG (mmol/L): 6 (calc)

## 2019-07-05 DIAGNOSIS — Z1231 Encounter for screening mammogram for malignant neoplasm of breast: Secondary | ICD-10-CM | POA: Diagnosis not present

## 2019-07-28 ENCOUNTER — Other Ambulatory Visit: Payer: Self-pay | Admitting: Family Medicine

## 2019-07-28 DIAGNOSIS — E785 Hyperlipidemia, unspecified: Secondary | ICD-10-CM

## 2019-08-15 ENCOUNTER — Other Ambulatory Visit: Payer: Self-pay | Admitting: Family Medicine

## 2019-08-15 DIAGNOSIS — I10 Essential (primary) hypertension: Secondary | ICD-10-CM

## 2019-08-20 DIAGNOSIS — Z79899 Other long term (current) drug therapy: Secondary | ICD-10-CM | POA: Diagnosis not present

## 2019-08-20 DIAGNOSIS — M059 Rheumatoid arthritis with rheumatoid factor, unspecified: Secondary | ICD-10-CM | POA: Diagnosis not present

## 2019-09-01 ENCOUNTER — Other Ambulatory Visit: Payer: Self-pay | Admitting: Family Medicine

## 2019-09-01 DIAGNOSIS — I10 Essential (primary) hypertension: Secondary | ICD-10-CM

## 2019-11-14 ENCOUNTER — Ambulatory Visit: Payer: Medicare Other | Attending: Internal Medicine

## 2019-11-14 DIAGNOSIS — Z23 Encounter for immunization: Secondary | ICD-10-CM

## 2019-11-14 NOTE — Progress Notes (Signed)
   Covid-19 Vaccination Clinic  Name:  Hayley Simmons    MRN: [id number] DOB: 1946-05-12  11/14/2019  [name] was observed post Covid-19 immunization for 15 minutes without incidence. She was provided with Vaccine Information Sheet and instruction to access the V-Safe system.   [name] was instructed to call 911 with any severe reactions post vaccine: Marland Kitchen Difficulty breathing  . Swelling of your face and throat  . A fast heartbeat  . A bad rash all over your body  . Dizziness and weakness    Immunizations Administered    Name Date Dose VIS Date Route   Pfizer COVID-19 Vaccine 11/14/2019  9:17 AM 0.3 mL 08/30/2019 Intramuscular   Manufacturer: [place]   Lot: [id number]   [place]: [id number]

## 2019-11-15 DIAGNOSIS — E669 Obesity, unspecified: Secondary | ICD-10-CM | POA: Diagnosis not present

## 2019-11-15 DIAGNOSIS — M170 Bilateral primary osteoarthritis of knee: Secondary | ICD-10-CM | POA: Diagnosis not present

## 2019-11-15 DIAGNOSIS — Z79899 Other long term (current) drug therapy: Secondary | ICD-10-CM | POA: Diagnosis not present

## 2019-11-15 DIAGNOSIS — M059 Rheumatoid arthritis with rheumatoid factor, unspecified: Secondary | ICD-10-CM | POA: Diagnosis not present

## 2019-11-22 ENCOUNTER — Other Ambulatory Visit: Payer: Self-pay | Admitting: Family Medicine

## 2019-11-22 DIAGNOSIS — K219 Gastro-esophageal reflux disease without esophagitis: Secondary | ICD-10-CM

## 2019-12-11 ENCOUNTER — Ambulatory Visit: Payer: Medicare Other | Attending: Internal Medicine

## 2019-12-11 DIAGNOSIS — Z23 Encounter for immunization: Secondary | ICD-10-CM

## 2019-12-11 NOTE — Progress Notes (Signed)
   Covid-19 Vaccination Clinic  Name:  Hayley Simmons    MRN: [id number] DOB: 1946-01-13  12/11/2019  Hayley Simmons was observed post Covid-19 immunization for 15 minutes without incident. She was provided with Vaccine Information Sheet and instruction to access the V-Safe system.   Hayley Simmons was instructed to call 911 with any severe reactions post vaccine: Marland Kitchen Difficulty breathing  . Swelling of face and throat  . A fast heartbeat  . A bad rash all over body  . Dizziness and weakness   Immunizations Administered    Name Date Dose VIS Date Route   Pfizer COVID-19 Vaccine 12/11/2019  8:56 AM 0.3 mL 08/30/2019 Intramuscular   Manufacturer: [employer]   Lot: [id number]   [place]: [id number]

## 2020-01-17 ENCOUNTER — Other Ambulatory Visit: Payer: Self-pay | Admitting: Family Medicine

## 2020-01-17 DIAGNOSIS — E785 Hyperlipidemia, unspecified: Secondary | ICD-10-CM

## 2020-01-17 NOTE — Telephone Encounter (Signed)
Requested Prescriptions  Pending Prescriptions Disp Refills  . rosuvastatin (CRESTOR) 40 MG tablet [Pharmacy Med Name: ROSUVASTATIN 40MG  TABLETS] 90 tablet 1    Sig: TAKE 1 TABLET BY MOUTH EVERY EVENING. IN PLACE ATORVASTATIN     Cardiovascular:  Antilipid - Statins Failed - 01/17/2020 10:34 AM      Failed - HDL in normal range and within 360 days    HDL  Date Value Ref Range Status  06/24/2019 41 (L) > OR = 50 mg/dL Final  03/21/2016 35 (L) >39 mg/dL Final         Failed - Triglycerides in normal range and within 360 days    Triglycerides  Date Value Ref Range Status  06/24/2019 179 (H) <150 mg/dL Final         Passed - Total Cholesterol in normal range and within 360 days    Cholesterol, Total  Date Value Ref Range Status  03/21/2016 169 100 - 199 mg/dL Final   Cholesterol  Date Value Ref Range Status  06/24/2019 152 <200 mg/dL Final         Passed - LDL in normal range and within 360 days    LDL Cholesterol (Calc)  Date Value Ref Range Status  06/24/2019 83 mg/dL (calc) Final    Comment:    Reference range: <100 . Desirable range <100 mg/dL for primary prevention;   <70 mg/dL for patients with CHD or diabetic patients  with > or = 2 CHD risk factors. Marland Kitchen LDL-C is now calculated using the Martin-Hopkins  calculation, which is a validated novel method providing  better accuracy than the Friedewald equation in the  estimation of LDL-C.  Cresenciano Genre et al. Annamaria Helling. [id number]): [id number]  (http://education.[facility].com/faq/FAQ164)          Passed - Patient is not pregnant      Passed - Valid encounter within last 12 months    Recent Outpatient Visits          6 months ago Prediabetes   [place], FNP   11 months ago Prediabetes   [facility] Hubbard Hartshorn, [place]   1 year ago Essential hypertension   [place], FNP   1 year ago Diabetes mellitus type 2 in obese  [facility])   [place], FNP   1 year ago Weight loss, abnormal   [facility] Steele Sizer, MD      Future Appointments            In 3 weeks Hubbard Hartshorn, [facility], [facility]

## 2020-02-05 ENCOUNTER — Other Ambulatory Visit: Payer: Self-pay | Admitting: Emergency Medicine

## 2020-02-05 DIAGNOSIS — I10 Essential (primary) hypertension: Secondary | ICD-10-CM

## 2020-02-05 MED ORDER — OLMESARTAN MEDOXOMIL-HCTZ 20-12.5 MG PO TABS: 1.0000 | ORAL_TABLET | Freq: Every day | ORAL | 1 refills | Status: DC

## 2020-02-05 NOTE — Progress Notes (Signed)
Patient ID: Hayley Simmons, female    DOB: 12-26-1945, 74 y.o.   MRN: [id number]  PCP: Hayley Malkin, MD  Chief Complaint  Patient presents with  . Follow-up  . Prediabetes  . Gastroesophageal Reflux  . Hypertension  . Hypothyroidism    Subjective:   Hayley Simmons is a 74 y.o. female, presents to clinic with CC of the following:  Chief Complaint  Patient presents with  . Follow-up  . Prediabetes  . Gastroesophageal Reflux  . Hypertension  . Hypothyroidism    HPI:  Patient is a 74 year old female patient of Hayley Simmons Last seen 06/24/2019 Presents for her 62-month follow-up today  Issues followed previously and that were reviewed today included: Prediabetes: diagnosed July 2018, She was willing to try metformin noted in the past with Metformin XR 750 mg initiated and then she stopped last December when Rx ran out. Denies increased thirst, increased urination, numbness or tingling in the extremities Last A1C  Lab Results  Component Value Date   HGBA1C 5.4 06/24/2019    Morbid obesity:Has continued to gain weight, was up18lbs noted last visit with Hayley Simmons, and up 10 pounds from her last visit in October. Tries to watch her diet, although has not had success losing weight. No exercise, knees an issue, recommended exercise in the water as a good option Wt Readings from Last 3 Encounters:  02/07/20 241 lb 1.6 oz (109.4 kg)  06/24/19 231 lb 8 oz (105 kg)  02/21/19 213 lb 6.4 oz (96.8 kg)   Weight loss medications have been discussed with her in the past and again today  - she stopped ozempic after a few weeks previously because she didn't loose weight, other options were discussed and it was noted previously that she was not a good candidate for stimulant therapy due to HTN and anxiety. Again today discussed injectable medicines like liraglutide or Ozempic, and she may want to try utilizing it again after our discussion today.  We will check some labs  today initially, although she was concerned if there was a significant cost with these that may be a limiting factor.  Hxsubclinical hypothyroidism:  Lab Results  Component Value Date   TSH 4.52 (H) 06/24/2019   Last TSH check was in October 2020 denies palpitations,marked fatigue, has noted weight gain, denies hair/skin/nail changes or constipation/diarrhea.  CKD hx Lab Results  Component Value Date   CREATININE 0.91 06/24/2019   BUN 17 06/24/2019   NA 142 06/24/2019   K 4.4 06/24/2019   CL 105 06/24/2019   CO2 29 06/24/2019     Dyslipidemia: Lab Results  Component Value Date   CHOL 152 06/24/2019   HDL 41 (L) 06/24/2019   LDLCALC 83 06/24/2019   TRIG 179 (H) 06/24/2019   CHOLHDL 3.7 06/24/2019   On Crestor nowinstead of Lipitor, denies side effects of medication- no myalgias, chest pain, or shortness of breath.  HTN/Mitral Incompetence: BP Readings from Last 3 Encounters:  02/07/20 124/86  06/24/19 128/80  02/21/19 128/84   does check blood pressures at home and have been okay, about once a week Taking Lopressor 100mg  BID and Benicar 20-12.5.  No chest pain or palpitation, no increased headaches, no vision changes, no increased lower extremity swelling, Has murmur from Mitral Incompetence (dx'd at age 74), does not see cardiology.  GERD: doing better, takingomeprazoleonly as needed now - about once/week discussed long term risk of PPI again todayand encouraged to try to stop and assess She  tries to avoid triggers.   Denies chronic abdominal pain, changes in BM's, blood in stool, dark or black stools,   Major Depression/Anxiety:Going on for years, struggles with relationship with her daughter, and it makes her sad.She lost her brother on 08/03/2018 - he had cancer, but was doing well, then passed suddenly.  She is off of Xanax now. She is no longer taking Cymbalta daily. Not taking buspar. Needs something occasionally.  Noted a hydroxyzine  product, although when warned may make drowsy she states she does not want to take that. Not want to take a med daily, did discuss the reasons why that may be helpful, although she would prefer not to take the medicine daily at this time. PHQ-9 today was reviewed GAD7 reviewed  Rheumatoid Arthritis:  Diagnosed with seropositive RA and OA of both knees Seeing Rheumatology, with last visit in February 2021 On methotrexate weekly and folic acid daily and compliant with laboratory checks through rheumatology.  Does note the medicines are helpful. She had 1 total knee replacement, and is concerned he may need another in the near future.    Patient Active Problem List   Diagnosis Date Noted  . Subclinical hypothyroidism 02/07/2020  . Mild major depression ([place]) 11/21/2018  . GAD (generalized anxiety disorder) 08/22/2018  . Immunocompromised state due to drug therapy 08/22/2018  . Multiple polyps of sigmoid colon 08/22/2018  . Seropositive rheumatoid arthritis ([place]) 06/13/2018  . Total knee replacement status, left 02/19/2018  . Chronic kidney disease, stage III (moderate) 01/23/2018  . MI (mitral incompetence) 01/18/2018  . Prediabetes 06/21/2016  . Osteoarthritis of both knees 06/24/2015  . Female stress incontinence 06/24/2015  . Essential hypertension 05/19/2015  . GERD (gastroesophageal reflux disease) 05/19/2015  . Hyperlipidemia 05/19/2015  . At risk for falling 05/19/2015  . Obesity, morbid ([place]) 05/19/2015      Current Outpatient Medications:  .  aspirin 81 MG chewable tablet, Chew 1 tablet (81 mg total) by mouth 2 (two) times daily., Disp: 30 tablet, Rfl: 0 .  Aspirin-Acetaminophen-Caffeine (EXCEDRIN PO), Take by mouth as needed., Disp: , Rfl:  .  Calcium Carb-Cholecalciferol (CALCIUM 600+D3 PO), Take 1 tablet by mouth 2 (two) times daily., Disp: , Rfl:  .  docusate sodium (COLACE) 100 MG capsule, Take 1 capsule (100 mg total) by mouth 2 (two) times daily., Disp: 10 capsule,  Rfl: 0 .  fluticasone (FLONASE) 50 MCG/ACT nasal spray, Place 1-2 sprays into both nostrils daily as needed for allergies., Disp: , Rfl:  .  folic acid (FOLVITE) 1 MG tablet, Take 1 mg by mouth daily., Disp: , Rfl:  .  loratadine (CLARITIN) 10 MG tablet, as needed., Disp: , Rfl:  .  methotrexate (RHEUMATREX) 2.5 MG tablet, Take 17.5 mg by mouth once a week. Takes on Mondays , Disp: , Rfl:  .  metoprolol tartrate (LOPRESSOR) 100 MG tablet, TAKE 1 TABLET(100 MG) BY MOUTH TWICE DAILY, Disp: 180 tablet, Rfl: 1 .  olmesartan-hydrochlorothiazide (BENICAR HCT) 20-12.5 MG tablet, Take 1 tablet by mouth daily., Disp: 90 tablet, Rfl: 1 .  omeprazole (PRILOSEC) 20 MG capsule, TAKE 1 CAPSULE(20 MG) BY MOUTH DAILY, Disp: 90 capsule, Rfl: 1 .  rosuvastatin (CRESTOR) 40 MG tablet, TAKE 1 TABLET BY MOUTH EVERY EVENING. IN PLACE ATORVASTATIN, Disp: 90 tablet, Rfl: 1   No Known Allergies   Past Surgical History:  Procedure Laterality Date  . ABDOMINAL HYSTERECTOMY  1994  . CARPAL TUNNEL RELEASE Right 07/29/2008  . COLONOSCOPY WITH PROPOFOL N/A 10/10/2018  Procedure: COLONOSCOPY WITH PROPOFOL;  Surgeon: Lin Landsman, MD;  Location: [facility];  Service: Endoscopy;  Laterality: N/A;  . FOOT SURGERY Right    bunion  . POLYPECTOMY  10/10/2018   Procedure: POLYPECTOMY;  Surgeon: Lin Landsman, MD;  Location: [facility];  Service: Endoscopy;;  . TOTAL KNEE ARTHROPLASTY Left 02/19/2018   Procedure: TOTAL KNEE ARTHROPLASTY;  Surgeon: Lovell Sheehan, MD;  Location: [facility];  Service: Orthopedics;  Laterality: Left;     Family History  Problem Relation Age of Onset  . Gout Mother   . Dementia Mother   . Kidney disease Mother   . Cancer Mother        kidney  . Heart attack Mother   . Heart attack Father   . Cancer Brother      Social History   Tobacco Use  . Smoking status: Former Smoker    Packs/day: 0.25    Years: 31.00    Pack years: 7.75    Types: Cigarettes     Quit date: 11/18/1997    Years since quitting: 22.2  . Smokeless tobacco: Never Used  . Tobacco comment: quit in 11/1997  Substance Use Topics  . Alcohol use: No    Alcohol/week: 0.0 standard drinks    With staff assistance, above reviewed with the patient today.  ROS: As per HPI, otherwise no specific complaints on a limited and focused system review   No results found for this or any previous visit (from the past 72 hour(s)).   PHQ2/9: Depression screen [facility] 2/9 02/07/2020 06/24/2019 02/21/2019 11/21/2018 08/22/2018  Decreased Interest 0 0 0 0 1  Down, Depressed, Hopeless 0 0 0 0 1  PHQ - 2 Score 0 0 0 0 2  Altered sleeping 0 0 0 0 0  Tired, decreased energy 0 0 0 0 0  Change in appetite 0 0 0 0 0  Feeling bad or failure about yourself  0 0 0 0 1  Trouble concentrating 0 0 0 0 0  Moving slowly or fidgety/restless 0 0 0 0 0  Suicidal thoughts 0 0 0 0 0  PHQ-9 Score 0 0 0 0 3  Difficult doing work/chores Not difficult at all Not difficult at all Not difficult at all Not difficult at all Not difficult at all  Some recent data might be hidden   PHQ-2/9 Result is neg  GAD 7 : Generalized Anxiety Score 02/07/2020 01/18/2018  Nervous, Anxious, on Edge 0 1  Control/stop worrying 1 0  Worry too much - different things 0 1  Trouble relaxing 0 3  Restless 0 2  Easily annoyed or irritable 1 1  Afraid - awful might happen 0 0  Total GAD 7 Score 2 8  Anxiety Difficulty Not difficult at all -    Result reviewed - no panic attacks, episodic and more situational  Fall Risk: Fall Risk  02/07/2020 06/24/2019 02/21/2019 11/21/2018 08/22/2018  Falls in the past year? 0 0 0 0 0  Number falls in past yr: 0 0 0 0 0  Injury with Fall? 0 0 0 0 0  Follow up - Falls evaluation completed Falls evaluation completed - -      Objective:   Vitals:   02/07/20 0810  BP: 124/86  Pulse: 65  Resp: 16  Temp: 97.8 F (36.6 C)  TempSrc: Temporal  SpO2: 96%  Weight: 241 lb 1.6 oz (109.4 kg)  Height: 5\' 1"   (1.549 m)  Body mass index is 45.56 kg/m.  Physical Exam   NAD, masked, very pleasant HEENT - [place]/AT, sclera anicteric, positive glasses, PERRL, EOMI, conj - non-inj'ed, TM's and canals clear, pharynx clear Neck - supple, no adenopathy, no TM, carotids 2+ and = without bruits bilat Car - RRR with no loud murmur heard on exam today, Pulm- RR and effort normal at rest, CTA without wheeze or rales Abd - soft, NT, obese, ND, BS+,  no masses Back - no CVA tenderness Ext - no LE edema,  Neuro/psychiatric - affect was not flat, appropriate with conversation  Alert and oriented  Grossly non-focal - good strength on testing extremities, sensation intact to LT in distal extremities, DTRs 1+ and equal in the patella, Romberg was negative, good finger-to-nose,  Speech normal   Results for orders placed or performed in visit on 06/24/19  Lipid panel  Result Value Ref Range   Cholesterol 152 <200 mg/dL   HDL 41 (L) > OR = 50 mg/dL   Triglycerides 179 (H) <150 mg/dL   LDL Cholesterol (Calc) 83 mg/dL (calc)   Total CHOL/HDL Ratio 3.7 <5.0 (calc)   Non-HDL Cholesterol (Calc) 111 <130 mg/dL (calc)  COMPLETE METABOLIC PANEL WITH GFR  Result Value Ref Range   Glucose, Bld 88 65 - 99 mg/dL   BUN 17 7 - 25 mg/dL   Creat 0.91 0.60 - 0.93 mg/dL   GFR, Est Non African American 64 > OR = 60 mL/min/1.70m2   GFR, Est African American 74 > OR = 60 mL/min/1.86m2   BUN/Creatinine Ratio NOT APPLICABLE 6 - 22 (calc)   Sodium 142 135 - 146 mmol/L   Potassium 4.4 3.5 - 5.3 mmol/L   Chloride 105 98 - 110 mmol/L   CO2 29 20 - 32 mmol/L   Calcium 8.7 8.6 - 10.4 mg/dL   Total Protein 6.3 6.1 - 8.1 g/dL   Albumin 3.8 3.6 - 5.1 g/dL   Globulin 2.5 1.9 - 3.7 g/dL (calc)   AG Ratio 1.5 1.0 - 2.5 (calc)   Total Bilirubin 0.5 0.2 - 1.2 mg/dL   Alkaline phosphatase (APISO) 112 37 - 153 U/L   AST 17 10 - 35 U/L   ALT 14 6 - 29 U/L  Hemoglobin A1c  Result Value Ref Range   Hgb A1c MFr Bld 5.4 <5.7 % of total  Hgb   Mean Plasma Glucose 108 (calc)   eAG (mmol/L) 6.0 (calc)  TSH  Result Value Ref Range   TSH 4.52 (H) 0.40 - 4.50 mIU/L       Assessment & Plan:   1. Essential hypertension Blood pressures remain well controlled on her current medication regimen We will check a BMP today  2. Prediabetes Educated on this topic today Stop the Metformin in December, as she was unclear why she needed to take that.  She has not been diagnosed with diabetes in the past.  Her last A1c was okay as noted above. We will recheck a BMP and A1c today.  3. Obesity, morbid ([place]) Noted concerns with the continued weight gain, and the importance of healthy weight management.  She notes the knees limit her exercise, and strongly encouraged potential exercise and water as an option.  Also dietary modifications needed with weight loss.  She very much wanted to try a medicine as well, as she thought that might be helpful, and discussed those with her today, with a liraglutide or semaglutide product felt the best option presently.  I did note it  is not injectable, and she did try the Ozempic briefly in the past (was given samples, and then did not continue). We will see what the lab results are initially, and then potentially try 1 of these products with a cost concern noted by her when this was discussed.  Discussed ways to try to help with that moving forward, and she will research some, and may prescribe after the lab results are back if she wants to initiate. Did educate on these medicines, and the risk/ benefits.  4. Subclinical hypothyroidism We will recheck her TSH again today.  If further increasing, may need a thyroid supplement, and discussed that may be an issue with her weight gain as well if her TSH has in fact increased significantly.  (Being more hypothyroid)  5. Mixed hyperlipidemia We will continue the statin product.  Tolerating. Not repeat a lipid panel today, although will do again on  follow-up.  6. Stage 3 chronic kidney disease, unspecified whether stage 3a or 3b CKD Continuing to monitor.  7. Gastroesophageal reflux disease, unspecified whether esophagitis present Well-controlled, and encouraged her to try off of the PPIs completely, as discussed some of the concerns with long-term use of PPIs.  Can use them as needed and assess.  Also continue to avoid foods that are triggers.  8. Seropositive rheumatoid arthritis ([place]) Continue follow-up with rheumatology  9. Primary osteoarthritis of both knees Continue follow-up with rheumatology,  10. Mitral valve insufficiency, unspecified etiology Continuing to monitor presently.  11. Mild major depression ([place]) 12. GAD (generalized anxiety disorder) The PHQ-9 and GAD-7 tests were reviewed today, with more of an anxiety issue recently. Discussed options with her, and she really wanted to just have an as needed medicine for anxiety and not a daily medicine.  She did not want hydroxyzine due to concerns as it may make her drowsy, and felt reasonable to continue the BuSpar type product as needed, and noted that it does not work quickly, and if she is starting to feel more anxiety issues, to take it for several days, and then if much improved, can stop it and continue in this manner. May need a daily medicine again over time pending her status.   She notes she really does not feel like she needs to follow-up all that frequently when discussed follow-up visits, and will make a follow-up in 6 months time, sooner as needed. Await lab results presently.    Hayley Malkin, MD 02/07/20 8:24 AM

## 2020-02-07 ENCOUNTER — Other Ambulatory Visit: Payer: Self-pay

## 2020-02-07 ENCOUNTER — Encounter: Payer: Self-pay | Admitting: Internal Medicine

## 2020-02-07 ENCOUNTER — Ambulatory Visit (INDEPENDENT_AMBULATORY_CARE_PROVIDER_SITE_OTHER): Payer: Medicare Other | Admitting: Internal Medicine

## 2020-02-07 VITALS — BP 124/86 | HR 65 | Temp 97.8°F | Resp 16 | Ht 61.0 in | Wt 241.1 lb

## 2020-02-07 DIAGNOSIS — E039 Hypothyroidism, unspecified: Secondary | ICD-10-CM

## 2020-02-07 DIAGNOSIS — F320 Major depressive disorder, single episode, mild: Secondary | ICD-10-CM

## 2020-02-07 DIAGNOSIS — F411 Generalized anxiety disorder: Secondary | ICD-10-CM

## 2020-02-07 DIAGNOSIS — M170 Bilateral primary osteoarthritis of knee: Secondary | ICD-10-CM

## 2020-02-07 DIAGNOSIS — K219 Gastro-esophageal reflux disease without esophagitis: Secondary | ICD-10-CM

## 2020-02-07 DIAGNOSIS — E782 Mixed hyperlipidemia: Secondary | ICD-10-CM

## 2020-02-07 DIAGNOSIS — R7303 Prediabetes: Secondary | ICD-10-CM | POA: Diagnosis not present

## 2020-02-07 DIAGNOSIS — I340 Nonrheumatic mitral (valve) insufficiency: Secondary | ICD-10-CM

## 2020-02-07 DIAGNOSIS — N183 Chronic kidney disease, stage 3 unspecified: Secondary | ICD-10-CM | POA: Diagnosis not present

## 2020-02-07 DIAGNOSIS — M059 Rheumatoid arthritis with rheumatoid factor, unspecified: Secondary | ICD-10-CM

## 2020-02-07 DIAGNOSIS — I10 Essential (primary) hypertension: Secondary | ICD-10-CM | POA: Diagnosis not present

## 2020-02-07 DIAGNOSIS — E038 Other specified hypothyroidism: Secondary | ICD-10-CM | POA: Insufficient documentation

## 2020-02-07 MED ORDER — BUSPIRONE HCL 7.5 MG PO TABS: ORAL_TABLET | ORAL | 1 refills | Status: DC

## 2020-02-08 LAB — HEMOGLOBIN A1C
Hgb A1c MFr Bld: 5.4 % of total Hgb (ref <5.7)
Mean Plasma Glucose: 108 (calc)
eAG (mmol/L): 6 (calc)

## 2020-02-08 LAB — BASIC METABOLIC PANEL WITH GFR
BUN/Creatinine Ratio: 23 (calc) — ABNORMAL HIGH (ref 6–22)
BUN: 23 mg/dL (ref 7–25)
CO2: 29 mmol/L (ref 20–32)
Calcium: 9.6 mg/dL (ref 8.6–10.4)
Chloride: 103 mmol/L (ref 98–110)
Creat: 0.98 mg/dL — ABNORMAL HIGH (ref 0.60–0.93)
GFR, Est African American: 67 mL/min/{1.73_m2} (ref >=60)
GFR, Est Non African American: 58 mL/min/{1.73_m2} — ABNORMAL LOW (ref >=60)
Glucose, Bld: 96 mg/dL (ref 65–99)
Potassium: 4.4 mmol/L (ref 3.5–5.3)
Sodium: 140 mmol/L (ref 135–146)

## 2020-02-08 LAB — TSH: TSH: 4.29 mIU/L (ref 0.40–4.50)

## 2020-02-10 ENCOUNTER — Ambulatory Visit: Payer: Medicare Other | Admitting: Family Medicine

## 2020-02-13 DIAGNOSIS — Z79899 Other long term (current) drug therapy: Secondary | ICD-10-CM | POA: Diagnosis not present

## 2020-02-13 DIAGNOSIS — M059 Rheumatoid arthritis with rheumatoid factor, unspecified: Secondary | ICD-10-CM | POA: Diagnosis not present

## 2020-03-03 ENCOUNTER — Other Ambulatory Visit: Payer: Self-pay

## 2020-03-03 DIAGNOSIS — I10 Essential (primary) hypertension: Secondary | ICD-10-CM

## 2020-03-03 MED ORDER — METOPROLOL TARTRATE 100 MG PO TABS: ORAL_TABLET | ORAL | 1 refills | Status: DC

## 2020-04-14 ENCOUNTER — Ambulatory Visit: Payer: Medicare Other

## 2020-05-14 DIAGNOSIS — M059 Rheumatoid arthritis with rheumatoid factor, unspecified: Secondary | ICD-10-CM | POA: Diagnosis not present

## 2020-05-14 DIAGNOSIS — M170 Bilateral primary osteoarthritis of knee: Secondary | ICD-10-CM | POA: Diagnosis not present

## 2020-05-14 DIAGNOSIS — Z79899 Other long term (current) drug therapy: Secondary | ICD-10-CM | POA: Diagnosis not present

## 2020-07-08 ENCOUNTER — Ambulatory Visit (LOCAL_COMMUNITY_HEALTH_CENTER): Payer: Self-pay

## 2020-07-08 ENCOUNTER — Other Ambulatory Visit: Payer: Self-pay

## 2020-07-08 DIAGNOSIS — Z23 Encounter for immunization: Secondary | ICD-10-CM

## 2020-07-10 DIAGNOSIS — Z1231 Encounter for screening mammogram for malignant neoplasm of breast: Secondary | ICD-10-CM | POA: Diagnosis not present

## 2020-07-26 ENCOUNTER — Other Ambulatory Visit: Payer: Self-pay | Admitting: Internal Medicine

## 2020-07-26 DIAGNOSIS — E785 Hyperlipidemia, unspecified: Secondary | ICD-10-CM

## 2020-07-29 ENCOUNTER — Other Ambulatory Visit: Payer: Self-pay | Admitting: Internal Medicine

## 2020-07-29 DIAGNOSIS — I10 Essential (primary) hypertension: Secondary | ICD-10-CM

## 2020-08-17 NOTE — Progress Notes (Signed)
Patient ID: Hayley Simmons, female    DOB: June 02, 1946, 74 y.o.   MRN: [id number]  PCP: Towanda Malkin, MD  Chief Complaint  Patient presents with  . Follow-up    Subjective:   Hayley Simmons is a 74 y.o. female, presents to clinic with CC of the following:  Chief Complaint  Patient presents with  . Follow-up    HPI:  Patient is a 74 year old female Last seen 02/07/20 F/u communication with her after labs obtained on that visit was as follows:  The basic metabolic panel showed that your kidney function has remained stable, slightly down from the most recent check 7 months ago and will continue to monitor. The blood glucose, electrolytes, and calcium level were normal The A1c remains good at 5.4 The thyroid screen (TSH) was also normal at 4.29. Noting your current kidney function, we need to be careful with additional medicines to try to help with weight loss, with returning to a medicine like Ozempic that you had tried previously or a relative of Ozempic like liraglutide may still be an option. That medicine can lower blood sugars, and we would need to closely monitor that as well. If you do want to add that medicine in the near future, please let me know, or we can discuss it further on a follow-up visit in the future. Please do your best to try to stay physically active and watch your diet with both quality and quantity of foods consumed to help presently as well. Presents for follow-up today  She had a comprehensive metabolic panel in August, 2021 thru rheumatology and was all normal except a slightly elevated alk phos at 112. Her Bun/Cr was 19 and 0.9, GFR - 62, glc - 95 and electrolytes normal.  Prediabetes: diagnosed July 2018, She was willing to try metformin noted in the past with Metformin XR 750 mg initiated and then she stopped last December when Rx ran out. Denies increased thirst, increased urination, numbness or tingling in the extremities Last A1C   Lab Results  Component Value Date   HGBA1C 5.4 02/07/2020   HGBA1C 5.4 06/24/2019   HGBA1C 5.4 11/21/2018   Lab Results  Component Value Date   MICROALBUR 20 01/18/2018   LDLCALC 83 06/24/2019   CREATININE 0.98 (H) 02/07/2020     Morbid obesity:Has continued to gain weight, just a slight increase since last visit. Tries to watch her diet, although has not had success losing weight. No exercise, knees an issue, trying to walk, recommended exercise in the water as a good option last visit and not able to do. Wt Readings from Last 3 Encounters:  08/18/20 245 lb 8 oz (111.4 kg)  02/07/20 241 lb 1.6 oz (109.4 kg)  06/24/19 231 lb 8 oz (105 kg)    Weight loss medications have been discussed previously - she stopped ozempic after a few weeks previously because she didn't loose weight, other options were discussed and it was noted previously that she was not a good candidate for stimulant therapy due to HTN and anxiety. Last visit discussed injectable medicines like liraglutide or Ozempic, and she noted then may want to try utilizing again after our discussion, although she was concerned if there was a significant cost with these that may be a limiting factor. Holding off on pursuing presently  Hxsubclinical hypothyroidism: Lab Results  Component Value Date   TSH 4.29 02/07/2020   denies palpitations,marked fatigue, denies hair/skin/nail changes   CKD hx - (last  comp panel thru rheum 04/2020 as above noted) Lab Results  Component Value Date   CREATININE 0.98 (H) 02/07/2020   BUN 23 02/07/2020   NA 140 02/07/2020   K 4.4 02/07/2020   CL 103 02/07/2020   CO2 29 02/07/2020    Dyslipidemia: Lab Results  Component Value Date   CHOL 152 06/24/2019   HDL 41 (L) 06/24/2019   LDLCALC 83 06/24/2019   TRIG 179 (H) 06/24/2019   CHOLHDL 3.7 06/24/2019   On Crestor nowinstead of Lipitor, denies side effects of medication- no myalgias, chest pain, or shortness of breath.   HTN/Mitral Incompetence: BP Readings from Last 3 Encounters:  08/18/20 (!) 150/100  02/07/20 124/86  06/24/19 128/80    Med regimen -  Lopressor 146m BID and Benicar 20-12.5.  does check blood pressures at home and have been great, 120-130/80,  about once a week, thinks that a slight increase was due to her rushing here and back to the room for the visit. No chest pain or palpitation, no increased headaches, no vision changes, no increased lower extremity swelling, Has murmur from Mitral Incompetence (dx'd at age 74, does not see cardiology.  GERD: doing better, takingomeprazoleonly as needed, and not taken in recent past. Not needed in recent past as denies increased heartburn symptoms. Aware of concerns with long term use of daily PPI's discussed prior She tries to avoid triggers.  Denies chronic abdominal pain, changes in BM's, blood in stool, dark or black stools,   Major Depression/Anxiety:Going on for years, struggles with relationship with her daughter, and it makes her sad.She lost her brother on 08/03/2018 - he had cancer, but was doing well, then passed suddenly.  She is off of Xanax now. Sheis no longer taking Cymbaltadaily. Not taking buspar, although notes she has this at home to take.  Last visit noted a hydroxyzine product prn may help, although when warned may make drowsy she states she did not want to take that. She notes her anxiety increases over the holidays, and would like to have a medicine to help with that.  She noted the Xanax worked well for her, although I discussed at length concerns with that medicine presently.  phq reviewed today  Rheumatoid Arthritis: Diagnosed with seropositive RA and OA of both knees Seeing Rheumatology, with last visit 05/14/20, next f/u in Dec planned. On methotrexate weekly and folic acid daily and compliant with laboratory checks through rheumatology.  Does note the medicines are helpful. She had 1 total knee  replacement, and is concerned he may need another in the near future.  Screening mammogram 07/10/20 was negative,     Patient Active Problem List   Diagnosis Date Noted  . Subclinical hypothyroidism 02/07/2020  . Mild major depression ([place] 11/21/2018  . GAD (generalized anxiety disorder) 08/22/2018  . Immunocompromised state due to drug therapy ([place] 08/22/2018  . Multiple polyps of sigmoid colon 08/22/2018  . Seropositive rheumatoid arthritis ([place] 06/13/2018  . Total knee replacement status, left 02/19/2018  . Chronic kidney disease, stage III (moderate) ([place] 01/23/2018  . MI (mitral incompetence) 01/18/2018  . Prediabetes 06/21/2016  . Osteoarthritis of both knees 06/24/2015  . Female stress incontinence 06/24/2015  . Essential hypertension 05/19/2015  . GERD (gastroesophageal reflux disease) 05/19/2015  . Hyperlipidemia 05/19/2015  . At risk for falling 05/19/2015  . Obesity, morbid ([facility] 05/19/2015      Current Outpatient Medications:  .  aspirin 81 MG chewable tablet, Chew 1 tablet (81 mg total) by mouth 2 (  two) times daily. (Patient taking differently: Chew 81 mg by mouth daily. ), Disp: 30 tablet, Rfl: 0 .  Aspirin-Acetaminophen-Caffeine (EXCEDRIN PO), Take by mouth as needed., Disp: , Rfl:  .  Calcium Carb-Cholecalciferol (CALCIUM 600+D3 PO), Take 1 tablet by mouth 2 (two) times daily., Disp: , Rfl:  .  fluticasone (FLONASE) 50 MCG/ACT nasal spray, Place 1-2 sprays into both nostrils daily as needed for allergies., Disp: , Rfl:  .  folic acid (FOLVITE) 1 MG tablet, Take 1 mg by mouth daily., Disp: , Rfl:  .  loratadine (CLARITIN) 10 MG tablet, as needed., Disp: , Rfl:  .  methotrexate (RHEUMATREX) 2.5 MG tablet, Take 17.5 mg by mouth once a week. Takes on Mondays , Disp: , Rfl:  .  metoprolol tartrate (LOPRESSOR) 100 MG tablet, TAKE 1 TABLET(100 MG) BY MOUTH TWICE DAILY, Disp: 180 tablet, Rfl: 1 .  olmesartan-hydrochlorothiazide (BENICAR HCT) 20-12.5 MG tablet, TAKE 1  TABLET BY MOUTH DAILY, Disp: 90 tablet, Rfl: 1 .  rosuvastatin (CRESTOR) 40 MG tablet, TAKE 1 TABLET BY MOUTH EVERY EVENING. IN PLACE ATORVASTATIN, Disp: 90 tablet, Rfl: 1 .  omeprazole (PRILOSEC) 20 MG capsule, TAKE 1 CAPSULE(20 MG) BY MOUTH DAILY (Patient not taking: Reported on 08/18/2020), Disp: 90 capsule, Rfl: 1   No Known Allergies   Past Surgical History:  Procedure Laterality Date  . ABDOMINAL HYSTERECTOMY  1994  . CARPAL TUNNEL RELEASE Right 07/29/2008  . COLONOSCOPY WITH PROPOFOL N/A 10/10/2018   Procedure: COLONOSCOPY WITH PROPOFOL;  Surgeon: Lin Landsman, MD;  Location: [facility];  Service: Endoscopy;  Laterality: N/A;  . FOOT SURGERY Right    bunion  . POLYPECTOMY  10/10/2018   Procedure: POLYPECTOMY;  Surgeon: Lin Landsman, MD;  Location: [facility];  Service: Endoscopy;;  . TOTAL KNEE ARTHROPLASTY Left 02/19/2018   Procedure: TOTAL KNEE ARTHROPLASTY;  Surgeon: Lovell Sheehan, MD;  Location: [facility];  Service: Orthopedics;  Laterality: Left;     Family History  Problem Relation Age of Onset  . Gout Mother   . Dementia Mother   . Kidney disease Mother   . Cancer Mother        kidney  . Heart attack Mother   . Heart attack Father   . Cancer Brother      Social History   Tobacco Use  . Smoking status: Former Smoker    Packs/day: 0.25    Years: 31.00    Pack years: 7.75    Types: Cigarettes    Quit date: 11/18/1997    Years since quitting: 22.7  . Smokeless tobacco: Never Used  . Tobacco comment: quit in 11/1997  Substance Use Topics  . Alcohol use: No    Alcohol/week: 0.0 standard drinks    With staff assistance, above reviewed with the patient today.  ROS: As per HPI, otherwise no specific complaints on a limited and focused system review   No results found for this or any previous visit (from the past 72 hour(s)).   PHQ2/9: Depression screen [facility] 2/9 08/18/2020 02/07/2020 06/24/2019 02/21/2019 11/21/2018  Decreased  Interest 0 0 0 0 0  Down, Depressed, Hopeless 1 0 0 0 0  PHQ - 2 Score 1 0 0 0 0  Altered sleeping - 0 0 0 0  Tired, decreased energy - 0 0 0 0  Change in appetite - 0 0 0 0  Feeling bad or failure about yourself  - 0 0 0 0  Trouble concentrating - 0 0  0 0  Moving slowly or fidgety/restless - 0 0 0 0  Suicidal thoughts - 0 0 0 0  PHQ-9 Score - 0 0 0 0  Difficult doing work/chores - Not difficult at all Not difficult at all Not difficult at all Not difficult at all  Some recent data might be hidden   PHQ-2/9 Result reviewed  Fall Risk: Fall Risk  08/18/2020 02/07/2020 06/24/2019 02/21/2019 11/21/2018  Falls in the past year? 0 0 0 0 0  Number falls in past yr: 0 0 0 0 0  Injury with Fall? 0 0 0 0 0  Follow up - - Falls evaluation completed Falls evaluation completed -      Objective:   Vitals:   08/18/20 0759  BP: (!) 150/100  Pulse: 74  Resp: 16  Temp: 97.6 F (36.4 C)  TempSrc: Oral  SpO2: 96%  Weight: 245 lb 8 oz (111.4 kg)  Height: '5\' 1"'  (1.549 m)    Body mass index is 46.39 kg/m. Recheck BP - 130/88 (she noted rushed here and back to room) Physical Exam  NAD, masked, very pleasant HEENT - [place]/AT, sclera anicteric, positive glasses, PERRL, EOMI, conj - non-inj'ed,pharynx clear Neck - supple, no adenopathy, no TM, carotids 2+ and = without bruits bilat Car - RRR with no loud murmur heard on exam today, Pulm- RR and effort normal at rest, CTA without wheeze or rales Abd - soft, NT diffusely, obese, ND,  Back - no CVA tenderness Ext - no LE edema,  Neuro/psychiatric - affect was not flat, appropriate with conversation             Alert and oriented             Grossly non-focal - good strength on testing extremities, sensation intact to LT in distal extremities,             Speech normal    Results for orders placed or performed in visit on 23/30/07  BASIC METABOLIC PANEL WITH GFR  Result Value Ref Range   Glucose, Bld 96 65 - 99 mg/dL   BUN 23 7 - 25 mg/dL    Creat 0.98 (H) 0.60 - 0.93 mg/dL   GFR, Est Non African American 58 (L) > OR = 60 mL/min/1.37m   GFR, Est African American 67 > OR = 60 mL/min/1.7102m  BUN/Creatinine Ratio 23 (H) 6 - 22 (calc)   Sodium 140 135 - 146 mmol/L   Potassium 4.4 3.5 - 5.3 mmol/L   Chloride 103 98 - 110 mmol/L   CO2 29 20 - 32 mmol/L   Calcium 9.6 8.6 - 10.4 mg/dL  TSH  Result Value Ref Range   TSH 4.29 0.40 - 4.50 mIU/L  Hemoglobin A1c  Result Value Ref Range   Hgb A1c MFr Bld 5.4 <5.7 % of total Hgb   Mean Plasma Glucose 108 (calc)   eAG (mmol/L) 6.0 (calc)   Last labs reviewed    Assessment & Plan:    1. Essential hypertension Blood pressures remain well controlled on her current medication regimen at home checks, with a slight elevation in first check today, and better on recheck. Continue with her monitoring blood pressures at home, and emphasized if the top number greater than 140 or the bottom number greater than 90 with any regularity, follow-up sooner than planned.  She stated she would do so.  2. Prediabetes A1C's have remained good in the recent past Cont to monitor  3. Obesity, morbid ([place]  Noted concerns with her weight, and the importance of healthy weight management.   She notes the knees limit her exercise, and strongly encouraged potential exercise and water as an option previously, although access limited for that..  Also dietary modifications needed with weight loss.   Medicines to help have been discussed/utilized in the past, with a liraglutide or semaglutide product felt the best option noted last visit. Still notes a cost concern.   Holding off on that presently.  4. Subclinical hypothyroidism TSH has remained stable. Continue to monitor 5. Mixed hyperlipidemia Will continue the statin product.  Tolerating.  6. Stage 3 chronic kidney disease, unspecified whether stage 3a or 3b CKD Continuing to monitor.  7. Gastroesophageal reflux disease, unspecified whether  esophagitis present Well-controlled, and encouraged her to try off of the PPIs completely last visit, as discussed some of the concerns with long-term use of PPIs.  She has had success doing so.  Can continue to use them as needed presently, if symptoms again become more problematic.  Also continue to avoid foods that are triggers.  8. Seropositive rheumatoid arthritis ([place]) 9. Primary osteoarthritis of both knees Continue follow-up with rheumatology,   10. Mitral valve insufficiency, unspecified etiology Continuing to monitor presently.  11. Mild major depression ([place]) 12. GAD (generalized anxiety disorder) She continues to feel is more of an anxiety issue intermittently. Notes it tends to be more problematic over the holidays. Discussed options again today at length, and will try BuSpar more routinely, taking once daily at least through the holiday season to help. In January February, can try again to lessen BuSpar use and assess her response. Discussed how I feel this is a better option than using a Xanax product as needed which she noted was helpful in the past.  Will f/u in about 5 months time, sooner prn and noted to her today that her f/u will be with another provider as I am leaving the practice prior to her planned f/u visit.  Recommend she come fasting to her f/u visit and to include a lipid panel and TSH with her labs on that visit. Also emphasized the importance of continuing to follow up with rheum and having labs checked with them given the medications she is taking prescribed by them for her RA.       Towanda Malkin, MD 08/18/20 8:04 AM

## 2020-08-18 ENCOUNTER — Encounter: Payer: Self-pay | Admitting: Internal Medicine

## 2020-08-18 ENCOUNTER — Ambulatory Visit (INDEPENDENT_AMBULATORY_CARE_PROVIDER_SITE_OTHER): Payer: Medicare Other | Admitting: Internal Medicine

## 2020-08-18 ENCOUNTER — Other Ambulatory Visit: Payer: Self-pay

## 2020-08-18 VITALS — BP 150/100 | HR 74 | Temp 97.6°F | Resp 16 | Ht 61.0 in | Wt 245.5 lb

## 2020-08-18 DIAGNOSIS — I340 Nonrheumatic mitral (valve) insufficiency: Secondary | ICD-10-CM | POA: Diagnosis not present

## 2020-08-18 DIAGNOSIS — E782 Mixed hyperlipidemia: Secondary | ICD-10-CM

## 2020-08-18 DIAGNOSIS — I10 Essential (primary) hypertension: Secondary | ICD-10-CM | POA: Diagnosis not present

## 2020-08-18 DIAGNOSIS — M059 Rheumatoid arthritis with rheumatoid factor, unspecified: Secondary | ICD-10-CM | POA: Diagnosis not present

## 2020-08-18 DIAGNOSIS — M170 Bilateral primary osteoarthritis of knee: Secondary | ICD-10-CM

## 2020-08-18 DIAGNOSIS — R7303 Prediabetes: Secondary | ICD-10-CM

## 2020-08-18 DIAGNOSIS — K219 Gastro-esophageal reflux disease without esophagitis: Secondary | ICD-10-CM | POA: Diagnosis not present

## 2020-08-18 DIAGNOSIS — N183 Chronic kidney disease, stage 3 unspecified: Secondary | ICD-10-CM

## 2020-08-18 DIAGNOSIS — E038 Other specified hypothyroidism: Secondary | ICD-10-CM | POA: Diagnosis not present

## 2020-08-18 DIAGNOSIS — F320 Major depressive disorder, single episode, mild: Secondary | ICD-10-CM | POA: Diagnosis not present

## 2020-08-18 DIAGNOSIS — F411 Generalized anxiety disorder: Secondary | ICD-10-CM | POA: Diagnosis not present

## 2020-08-26 ENCOUNTER — Other Ambulatory Visit: Payer: Self-pay | Admitting: Internal Medicine

## 2020-08-26 DIAGNOSIS — I10 Essential (primary) hypertension: Secondary | ICD-10-CM

## 2020-09-03 ENCOUNTER — Telehealth: Payer: Self-pay | Admitting: *Deleted

## 2020-09-03 NOTE — Chronic Care Management (AMB) (Signed)
  Chronic Care Management   Outreach Note  09/03/2020 Name: [name] MRN: [id number] DOB: Oct 27, 1945  [name] is a 74 y.o. year old female who is a primary care patient of Towanda Malkin, MD. I reached out to Hayley Mask by phone today in response to a referral sent by [name]'s health plan.     An unsuccessful telephone outreach was attempted today. The patient was referred to the case management team for assistance with care management and care coordination.   Follow Up Plan: A HIPAA compliant phone message was left for the patient providing contact information and requesting a return call. The care management team will reach out to the patient again over the next 7 days. If patient returns call to provider office, please advise to call [place] at [phone].  [place] Management  Direct Dial: [phone]

## 2020-09-08 NOTE — Chronic Care Management (AMB) (Signed)
  Chronic Care Management   Note  09/08/2020 Name: Hayley Simmons MRN: [id number] DOB: [birth year]  Hayley Simmons is a 74 y.o. year old female who is a primary care patient of Towanda Malkin, MD. I reached out to Kaleen Mask by phone today in response to a referral sent by Ms. Hayley Memos [place]'s health plan.     [name] was given information about Chronic Care Management services today including:  1. CCM service includes personalized support from designated clinical staff supervised by her physician, including individualized plan of care and coordination with other care providers 2. 24/7 contact phone numbers for assistance for urgent and routine care needs. 3. Service will only be billed when office clinical staff spend 20 minutes or more in a month to coordinate care. 4. Only one practitioner may furnish and bill the service in a calendar month. 5. The patient may stop CCM services at any time (effective at the end of the month) by phone call to the office staff. 6. The patient will be responsible for cost sharing (co-pay) of up to 20% of the service fee (after annual deductible is met).  Patient did not agree to enrollment in care management services and does not wish to consider at this time.  Follow up plan: Patient declines engagement by the care management team. Appropriate care team members and provider have been notified via electronic communication. The care management team is available to follow up with the patient after provider conversation with the patient regarding recommendation for care management engagement and subsequent re-referral to the care management team.   [facility] Management  Direct Dial: [phone]

## 2020-09-14 DIAGNOSIS — M170 Bilateral primary osteoarthritis of knee: Secondary | ICD-10-CM | POA: Diagnosis not present

## 2020-09-14 DIAGNOSIS — Z79899 Other long term (current) drug therapy: Secondary | ICD-10-CM | POA: Diagnosis not present

## 2020-09-14 DIAGNOSIS — M059 Rheumatoid arthritis with rheumatoid factor, unspecified: Secondary | ICD-10-CM | POA: Diagnosis not present

## 2021-01-13 DIAGNOSIS — M059 Rheumatoid arthritis with rheumatoid factor, unspecified: Secondary | ICD-10-CM | POA: Diagnosis not present

## 2021-01-13 DIAGNOSIS — Z79899 Other long term (current) drug therapy: Secondary | ICD-10-CM | POA: Diagnosis not present

## 2021-01-25 ENCOUNTER — Other Ambulatory Visit: Payer: Self-pay | Admitting: Family Medicine

## 2021-01-25 DIAGNOSIS — E785 Hyperlipidemia, unspecified: Secondary | ICD-10-CM

## 2021-01-25 NOTE — Telephone Encounter (Signed)
}    Notes to clinic: Patient scheduled for appt on 02/16/2021 Review for refill    Requested Prescriptions  Pending Prescriptions Disp Refills   rosuvastatin (CRESTOR) 40 MG tablet 90 tablet 1    Sig: TAKE 1 TABLET BY MOUTH EVERY EVENING. IN PLACE ATORVASTATIN      Cardiovascular:  Antilipid - Statins Failed - 01/25/2021  8:54 AM      Failed - Total Cholesterol in normal range and within 360 days    Cholesterol, Total  Date Value Ref Range Status  03/21/2016 169 100 - 199 mg/dL Final   Cholesterol  Date Value Ref Range Status  06/24/2019 152 <200 mg/dL Final          Failed - LDL in normal range and within 360 days    LDL Cholesterol (Calc)  Date Value Ref Range Status  06/24/2019 83 mg/dL (calc) Final    Comment:    Reference range: <100 . Desirable range <100 mg/dL for primary prevention;   <70 mg/dL for patients with CHD or diabetic patients  with > or = 2 CHD risk factors. Marland Kitchen LDL-C is now calculated using the Martin-Hopkins  calculation, which is a validated novel method providing  better accuracy than the Friedewald equation in the  estimation of LDL-C.  Cresenciano Genre et al. Annamaria Helling. 0175;102(58): 2061-2068  (http://education.[employer].com/faq/FAQ164)           Failed - HDL in normal range and within 360 days    HDL  Date Value Ref Range Status  06/24/2019 41 (L) > OR = 50 mg/dL Final  03/21/2016 35 (L) >39 mg/dL Final          Failed - Triglycerides in normal range and within 360 days    Triglycerides  Date Value Ref Range Status  06/24/2019 179 (H) <150 mg/dL Final          Passed - Patient is not pregnant      Passed - Valid encounter within last 12 months    Recent Outpatient Visits           5 months ago Essential hypertension   [place], [place]   11 months ago Essential hypertension   [facility] Towanda Malkin, MD   1 year ago Prediabetes   [place], [place], FNP   1 year ago Prediabetes   [place], [place], [place]   2 years ago Essential hypertension   [place], [place]       Future Appointments             In 3 weeks Delsa Grana, PA-C [facility], [facility]

## 2021-01-25 NOTE — Telephone Encounter (Signed)
Medication Refill - Medication: rosuvastatin (CRESTOR) 40 MG tablet (patient scheduled for 02/16/2021 for a follow up)   Has the patient contacted their pharmacy? Yes.    (Agent: If yes, when and what did the pharmacy advise?) Sent several request and to contact PCP    Preferred Pharmacy (with phone number or street name):  [facility] [place], [place] - [address] Phone:  [phone]  Fax:  [phone]       Agent: Please be advised that RX refills may take up to 3 business days. We ask that you follow-up with your pharmacy.

## 2021-01-25 NOTE — Telephone Encounter (Signed)
pt has appt 02-16-2021

## 2021-01-26 ENCOUNTER — Other Ambulatory Visit: Payer: Self-pay

## 2021-01-26 DIAGNOSIS — I10 Essential (primary) hypertension: Secondary | ICD-10-CM

## 2021-01-26 MED ORDER — OLMESARTAN MEDOXOMIL-HCTZ 20-12.5 MG PO TABS: 1.0000 | ORAL_TABLET | Freq: Every day | ORAL | 0 refills | Status: DC

## 2021-01-26 MED ORDER — ROSUVASTATIN CALCIUM 40 MG PO TABS: ORAL_TABLET | ORAL | 1 refills | Status: DC

## 2021-02-16 ENCOUNTER — Ambulatory Visit: Payer: Medicare Other | Admitting: Family Medicine

## 2021-02-17 ENCOUNTER — Telehealth: Payer: Self-pay | Admitting: Internal Medicine

## 2021-02-17 NOTE — Telephone Encounter (Signed)
Patient declined completing the Medicare Wellness Visit.

## 2021-02-25 ENCOUNTER — Other Ambulatory Visit: Payer: Self-pay

## 2021-02-25 ENCOUNTER — Encounter: Payer: Self-pay | Admitting: Unknown Physician Specialty

## 2021-02-25 ENCOUNTER — Ambulatory Visit (INDEPENDENT_AMBULATORY_CARE_PROVIDER_SITE_OTHER): Payer: Medicare Other | Admitting: Unknown Physician Specialty

## 2021-02-25 VITALS — BP 128/84 | HR 64 | Temp 98.3°F | Resp 16 | Ht 61.0 in | Wt 245.6 lb

## 2021-02-25 DIAGNOSIS — K219 Gastro-esophageal reflux disease without esophagitis: Secondary | ICD-10-CM

## 2021-02-25 DIAGNOSIS — I10 Essential (primary) hypertension: Secondary | ICD-10-CM

## 2021-02-25 DIAGNOSIS — R7303 Prediabetes: Secondary | ICD-10-CM | POA: Diagnosis not present

## 2021-02-25 DIAGNOSIS — E782 Mixed hyperlipidemia: Secondary | ICD-10-CM

## 2021-02-25 NOTE — Assessment & Plan Note (Addendum)
Discussed GLP1.  She will call her insurance company.  Check Hgb A1C.  Discussed diet diary.  Will come in for an earlier f/u if wants to consider tx.

## 2021-02-25 NOTE — Assessment & Plan Note (Signed)
Check lipid panel today.  Tolerating medication well

## 2021-02-25 NOTE — Assessment & Plan Note (Signed)
Check Hgb A1C today

## 2021-02-25 NOTE — Patient Instructions (Signed)
GLP 1 for weight loss - [place]

## 2021-02-25 NOTE — Assessment & Plan Note (Signed)
Stable, continue present medications.   

## 2021-02-25 NOTE — Progress Notes (Signed)
BP 128/84 (Patient Position: Sitting, Cuff Size: Large) Comment (BP Location): left arm  Pulse 64   Temp 98.3 F (36.8 C) (Oral)   Resp 16   Ht 5\' 1"  (1.549 m)   Wt 245 lb 9.6 oz (111.4 kg)   SpO2 94%   BMI 46.41 kg/m    Subjective:    Patient ID: Hayley Simmons, female    DOB: 05/16/1946, 75 y.o.   MRN: [id number]  HPI: Hayley Simmons is a 75 y.o. female  Chief Complaint  Patient presents with   Hypertension   Hyperlipidemia   Gastroesophageal Reflux    Medication refills   Hypertension Using medications without difficulty Average home BPs   No problems or lightheadedness No chest pain with exertion or shortness of breath No Edema   Hyperlipidemia Using medications without problems: No Muscle aches  Diet compliance:Exercise:Not exercising.  Has not found a diet that works for her.  States that no food tastes good  GERD Pt was told to stop taking it.  However she never stopped.  She is taking it about twice a week.  She would like another prescription  Depression screen [facility] 2/9 02/25/2021 02/25/2021 08/18/2020 08/18/2020 02/07/2020  Decreased Interest 0 0 0 0 0  Down, Depressed, Hopeless 0 0 0 1 0  PHQ - 2 Score 0 0 0 1 0  Altered sleeping 0 - 0 - 0  Tired, decreased energy - - 0 - 0  Change in appetite - - 0 - 0  Feeling bad or failure about yourself  - - 0 - 0  Trouble concentrating - - 0 - 0  Moving slowly or fidgety/restless - - 0 - 0  Suicidal thoughts - - 0 - 0  PHQ-9 Score 0 - 0 - 0  Difficult doing work/chores - - - - Not difficult at all  Some recent data might be hidden   Obesity Pt might be interested in the "shot" for weight loss  Relevant past medical, surgical, family and social history reviewed and updated as indicated. Interim medical history since our last visit reviewed. Allergies and medications reviewed and updated.  Review of Systems  Per HPI unless specifically indicated above     Objective:    BP 128/84 (Patient Position:  Sitting, Cuff Size: Large) Comment (BP Location): left arm  Pulse 64   Temp 98.3 F (36.8 C) (Oral)   Resp 16   Ht 5\' 1"  (1.549 m)   Wt 245 lb 9.6 oz (111.4 kg)   SpO2 94%   BMI 46.41 kg/m   Wt Readings from Last 3 Encounters:  02/25/21 245 lb 9.6 oz (111.4 kg)  08/18/20 245 lb 8 oz (111.4 kg)  02/07/20 241 lb 1.6 oz (109.4 kg)    Physical Exam Constitutional:      General: She is not in acute distress.    Appearance: Normal appearance. She is well-developed.  HENT:     Head: Normocephalic and atraumatic.  Eyes:     General: Lids are normal. No scleral icterus.       Right eye: No discharge.        Left eye: No discharge.     Conjunctiva/sclera: Conjunctivae normal.  Neck:     Vascular: No carotid bruit or JVD.  Cardiovascular:     Rate and Rhythm: Normal rate and regular rhythm.     Heart sounds: Normal heart sounds.  Pulmonary:     Effort: Pulmonary effort is normal.  Breath sounds: Normal breath sounds.  Abdominal:     Palpations: There is no hepatomegaly or splenomegaly.  Musculoskeletal:        General: Normal range of motion.     Cervical back: Normal range of motion and neck supple.  Skin:    General: Skin is warm and dry.     Coloration: Skin is not pale.     Findings: No rash.  Neurological:     Mental Status: She is alert and oriented to person, place, and time.  Psychiatric:        Behavior: Behavior normal.        Thought Content: Thought content normal.        Judgment: Judgment normal.    Results for orders placed or performed in visit on 95/97/47  BASIC METABOLIC PANEL WITH GFR  Result Value Ref Range   Glucose, Bld 96 65 - 99 mg/dL   BUN 23 7 - 25 mg/dL   Creat 0.98 (H) 0.60 - 0.93 mg/dL   GFR, Est Non African American 58 (L) > OR = 60 mL/min/1.51m2   GFR, Est African American 67 > OR = 60 mL/min/1.31m2   BUN/Creatinine Ratio 23 (H) 6 - 22 (calc)   Sodium 140 135 - 146 mmol/L   Potassium 4.4 3.5 - 5.3 mmol/L   Chloride 103 98 - 110  mmol/L   CO2 29 20 - 32 mmol/L   Calcium 9.6 8.6 - 10.4 mg/dL  TSH  Result Value Ref Range   TSH 4.29 0.40 - 4.50 mIU/L  Hemoglobin A1c  Result Value Ref Range   Hgb A1c MFr Bld 5.4 <5.7 % of total Hgb   Mean Plasma Glucose 108 (calc)   eAG (mmol/L) 6.0 (calc)      Assessment & Plan:   Problem List Items Addressed This Visit       Unprioritized   Essential hypertension - Primary    Stable, continue present medications.         GERD (gastroesophageal reflux disease)   Hyperlipidemia    Check lipid panel today.  Tolerating medication well       Relevant Orders   Lipid panel   Obesity, morbid ([place])    Discussed GLP1.  She will call her insurance company.  Check Hgb A1C.  Discussed diet diary.  Will come in for an earlier f/u if wants to consider tx.         Relevant Orders   HgB A1c   Prediabetes    Check Hgb A1C today       Relevant Orders   HgB A1c     Follow up plan: Return in about 6 months (around 08/27/2021).

## 2021-02-26 ENCOUNTER — Other Ambulatory Visit: Payer: Self-pay | Admitting: Family Medicine

## 2021-02-26 ENCOUNTER — Other Ambulatory Visit: Payer: Self-pay | Admitting: Unknown Physician Specialty

## 2021-02-26 DIAGNOSIS — I10 Essential (primary) hypertension: Secondary | ICD-10-CM

## 2021-02-26 LAB — LIPID PANEL
Cholesterol: 141 mg/dL (ref <200)
HDL: 44 mg/dL — ABNORMAL LOW (ref >=50)
LDL Cholesterol (Calc): 70 mg/dL (calc)
Non-HDL Cholesterol (Calc): 97 mg/dL (calc) (ref <130)
Total CHOL/HDL Ratio: 3.2 (calc) (ref <5.0)
Triglycerides: 206 mg/dL — ABNORMAL HIGH (ref <150)

## 2021-02-26 LAB — HEMOGLOBIN A1C
Hgb A1c MFr Bld: 5.6 % of total Hgb (ref <5.7)
Mean Plasma Glucose: 114 mg/dL
eAG (mmol/L): 6.3 mmol/L

## 2021-02-26 NOTE — Telephone Encounter (Signed)
   Notes to clinic: Requesting 90 day supply    Requested Prescriptions  Pending Prescriptions Disp Refills   olmesartan-hydrochlorothiazide (BENICAR HCT) 20-12.5 MG tablet [Pharmacy Med Name: OLMESARTAN MEDOX/HCTZ 20-12.5MG  TAB] 90 tablet     Sig: TAKE 1 TABLET BY MOUTH DAILY      Cardiovascular: ARB + Diuretic Combos Failed - 02/26/2021 11:04 AM      Failed - K in normal range and within 180 days    Potassium  Date Value Ref Range Status  02/07/2020 4.4 3.5 - 5.3 mmol/L Final          Failed - Na in normal range and within 180 days    Sodium  Date Value Ref Range Status  02/07/2020 140 135 - 146 mmol/L Final  12/21/2015 146 (H) 134 - 144 mmol/L Final          Failed - Cr in normal range and within 180 days    Creat  Date Value Ref Range Status  02/07/2020 0.98 (H) 0.60 - 0.93 mg/dL Final    Comment:    For patients >68 years of age, the reference limit for Creatinine is approximately 13% higher for people identified as African-American. .    Creatinine, Urine  Date Value Ref Range Status  04/11/2017 186 20 - 320 mg/dL Final          Failed - Ca in normal range and within 180 days    Calcium  Date Value Ref Range Status  02/07/2020 9.6 8.6 - 10.4 mg/dL Final          Passed - Patient is not pregnant      Passed - Last BP in normal range    BP Readings from Last 1 Encounters:  02/25/21 128/84          Passed - Valid encounter within last 6 months    Recent Outpatient Visits           Yesterday Essential hypertension   [place], NP   6 months ago Essential hypertension   [place], MD   1 year ago Essential hypertension   [facility] Malk[place], MD   1 year ago Prediabetes   [place], [place], FNP   2 years ago Prediabetes   [place], [place]       Future Appointments              In 6 months Delsa Grana, PA-C [facility], [facility]

## 2021-02-26 NOTE — Telephone Encounter (Signed)
Pt last appt 02/25/2021 8:03 AM and has another appt in July 2022

## 2021-03-01 ENCOUNTER — Encounter: Payer: Self-pay | Admitting: Unknown Physician Specialty

## 2021-03-02 ENCOUNTER — Other Ambulatory Visit: Payer: Self-pay | Admitting: Unknown Physician Specialty

## 2021-03-02 DIAGNOSIS — I10 Essential (primary) hypertension: Secondary | ICD-10-CM

## 2021-03-02 DIAGNOSIS — K219 Gastro-esophageal reflux disease without esophagitis: Secondary | ICD-10-CM

## 2021-03-02 MED ORDER — METOPROLOL TARTRATE 100 MG PO TABS: 100.0000 mg | ORAL_TABLET | Freq: Two times a day (BID) | ORAL | 1 refills | Status: DC

## 2021-03-02 MED ORDER — OMEPRAZOLE 20 MG PO CPDR: 20.0000 mg | DELAYED_RELEASE_CAPSULE | Freq: Every day | ORAL | 1 refills | Status: DC

## 2021-05-17 DIAGNOSIS — M059 Rheumatoid arthritis with rheumatoid factor, unspecified: Secondary | ICD-10-CM | POA: Diagnosis not present

## 2021-05-17 DIAGNOSIS — Z79899 Other long term (current) drug therapy: Secondary | ICD-10-CM | POA: Diagnosis not present

## 2021-05-17 DIAGNOSIS — E669 Obesity, unspecified: Secondary | ICD-10-CM | POA: Diagnosis not present

## 2021-07-12 DIAGNOSIS — Z1231 Encounter for screening mammogram for malignant neoplasm of breast: Secondary | ICD-10-CM | POA: Diagnosis not present

## 2021-07-15 ENCOUNTER — Other Ambulatory Visit: Payer: Self-pay | Admitting: Family Medicine

## 2021-07-15 DIAGNOSIS — E785 Hyperlipidemia, unspecified: Secondary | ICD-10-CM

## 2021-07-23 ENCOUNTER — Telehealth: Payer: Self-pay | Admitting: Family Medicine

## 2021-07-23 DIAGNOSIS — E785 Hyperlipidemia, unspecified: Secondary | ICD-10-CM

## 2021-08-02 ENCOUNTER — Other Ambulatory Visit: Payer: Self-pay

## 2021-08-02 DIAGNOSIS — E785 Hyperlipidemia, unspecified: Secondary | ICD-10-CM

## 2021-08-02 MED ORDER — ROSUVASTATIN CALCIUM 40 MG PO TABS: ORAL_TABLET | ORAL | 0 refills | Status: DC

## 2021-08-02 NOTE — Telephone Encounter (Signed)
Pt called stating that she is completely out of medication. Please advise.

## 2021-08-02 NOTE — Telephone Encounter (Signed)
Spoke to pt and states she needs Rosuvastatin refill.

## 2021-08-24 ENCOUNTER — Other Ambulatory Visit: Payer: Self-pay

## 2021-08-24 ENCOUNTER — Encounter: Payer: Self-pay | Admitting: Nurse Practitioner

## 2021-08-24 ENCOUNTER — Ambulatory Visit (INDEPENDENT_AMBULATORY_CARE_PROVIDER_SITE_OTHER): Payer: Medicare Other | Admitting: Nurse Practitioner

## 2021-08-24 VITALS — BP 130/72 | HR 64 | Temp 97.8°F | Resp 16 | Ht 61.0 in | Wt 244.3 lb

## 2021-08-24 DIAGNOSIS — Z1231 Encounter for screening mammogram for malignant neoplasm of breast: Secondary | ICD-10-CM

## 2021-08-24 DIAGNOSIS — E782 Mixed hyperlipidemia: Secondary | ICD-10-CM

## 2021-08-24 DIAGNOSIS — K219 Gastro-esophageal reflux disease without esophagitis: Secondary | ICD-10-CM

## 2021-08-24 DIAGNOSIS — Z23 Encounter for immunization: Secondary | ICD-10-CM | POA: Diagnosis not present

## 2021-08-24 DIAGNOSIS — I10 Essential (primary) hypertension: Secondary | ICD-10-CM

## 2021-08-24 DIAGNOSIS — F411 Generalized anxiety disorder: Secondary | ICD-10-CM | POA: Diagnosis not present

## 2021-08-24 LAB — CBC WITH DIFFERENTIAL/PLATELET
Absolute Monocytes: 450 cells/uL (ref 200–950)
Basophils Absolute: 30 cells/uL (ref 0–200)
Basophils Relative: 0.4 %
Eosinophils Absolute: 188 cells/uL (ref 15–500)
Eosinophils Relative: 2.5 %
HCT: 39.7 % (ref 35.0–45.0)
Hemoglobin: 13 g/dL (ref 11.7–15.5)
Lymphs Abs: 1223 cells/uL (ref 850–3900)
MCH: 30.7 pg (ref 27.0–33.0)
MCHC: 32.7 g/dL (ref 32.0–36.0)
MCV: 93.6 fL (ref 80.0–100.0)
MPV: 10.2 fL (ref 7.5–12.5)
Monocytes Relative: 6 %
Neutro Abs: 5610 cells/uL (ref 1500–7800)
Neutrophils Relative %: 74.8 %
Platelets: 238 10*3/uL (ref 140–400)
RBC: 4.24 10*6/uL (ref 3.80–5.10)
RDW: 14.7 % (ref 11.0–15.0)
Total Lymphocyte: 16.3 %
WBC: 7.5 10*3/uL (ref 3.8–10.8)

## 2021-08-24 LAB — COMPLETE METABOLIC PANEL WITH GFR
AG Ratio: 1.7 (calc) (ref 1.0–2.5)
ALT: 14 U/L (ref 6–29)
AST: 21 U/L (ref 10–35)
Albumin: 4.1 g/dL (ref 3.6–5.1)
Alkaline phosphatase (APISO): 98 U/L (ref 37–153)
BUN: 19 mg/dL (ref 7–25)
CO2: 31 mmol/L (ref 20–32)
Calcium: 9.1 mg/dL (ref 8.6–10.4)
Chloride: 100 mmol/L (ref 98–110)
Creat: 0.95 mg/dL (ref 0.60–1.00)
Globulin: 2.4 g/dL (calc) (ref 1.9–3.7)
Glucose, Bld: 85 mg/dL (ref 65–99)
Potassium: 4.3 mmol/L (ref 3.5–5.3)
Sodium: 139 mmol/L (ref 135–146)
Total Bilirubin: 0.8 mg/dL (ref 0.2–1.2)
Total Protein: 6.5 g/dL (ref 6.1–8.1)
eGFR: 63 mL/min/{1.73_m2} (ref >=60)

## 2021-08-24 LAB — LIPID PANEL
Cholesterol: 133 mg/dL (ref <200)
HDL: 39 mg/dL — ABNORMAL LOW (ref >=50)
LDL Cholesterol (Calc): 65 mg/dL (calc)
Non-HDL Cholesterol (Calc): 94 mg/dL (calc) (ref <130)
Total CHOL/HDL Ratio: 3.4 (calc) (ref <5.0)
Triglycerides: 233 mg/dL — ABNORMAL HIGH (ref <150)

## 2021-08-24 MED ORDER — OMEPRAZOLE 20 MG PO CPDR
20.0000 mg | DELAYED_RELEASE_CAPSULE | Freq: Every day | ORAL | 3 refills | Status: DC
Start: 2021-08-24 — End: 2022-08-25

## 2021-08-24 MED ORDER — METOPROLOL TARTRATE 100 MG PO TABS: 100.0000 mg | ORAL_TABLET | Freq: Two times a day (BID) | ORAL | 3 refills | Status: DC

## 2021-08-24 MED ORDER — OLMESARTAN MEDOXOMIL-HCTZ 20-12.5 MG PO TABS: 1.0000 | ORAL_TABLET | Freq: Every day | ORAL | 3 refills | Status: DC

## 2021-08-24 NOTE — Progress Notes (Signed)
BP 130/72   Pulse 64   Temp 97.8 F (36.6 C) (Oral)   Resp 16   Ht 5\' 1"  (1.549 m)   Wt 244 lb 4.8 oz (110.8 kg)   SpO2 96%   BMI 46.16 kg/m    Subjective:    Patient ID: Hayley Simmons, female    DOB: [birth year], 75 y.o.   MRN: [id number]  HPI: Hayley Simmons is a 75 y.o. female, here alone  Chief Complaint  Patient presents with   Hypertension   Hyperlipidemia    6 month follow up   HTN: She says her blood pressure has been good at home.  Her blood pressure is 130/72 in the office. She is currently taking metoprolol 100 mg. Olmesartan-hydrochlorothiazide 20-12.5 mg daily.  She denies any chest pain, shortness of breath, dizziness, headache or blurred vision.   Hyperlipidemia: Her last LDL was 70 on 02/25/21.  She is currently on rosuvastatin 40 mg daily.  She denies any myalgia.  Will get labs today  GERD: She says her acid reflux has been good.  She says she has been able to decrease her dose of omeprazole down to twice a week.  Discussed not eating an hour before going to bed.  Discussed staying away from trigger foods.   Obesity:  She says she has been wanting to lose weight.  She says she is not really able to exercise and her husband makes her breakfast in bed almost every day. Discussed weight loss medication but she is not interested due to the side effects that they have. Discussed water aerobics and silver sneakers.  She says she is going to think about it.    Anxiety:  She says that she has tried Buspar but it did not help.  Discussed other treatments and she declines at this time.  She says her anxiety only increases every once in awhile.    GAD 7 : Generalized Anxiety Score 02/07/2020 01/18/2018  Nervous, Anxious, on Edge 0 1  Control/stop worrying 1 0  Worry too much - different things 0 1  Trouble relaxing 0 3  Restless 0 2  Easily annoyed or irritable 1 1  Afraid - awful might happen 0 0  Total GAD 7 Score 2 8  Anxiety Difficulty Not difficult at all -     Relevant past medical, surgical, family and social history reviewed and updated as indicated. Interim medical history since our last visit reviewed. Allergies and medications reviewed and updated.  Review of Systems  Constitutional: Negative for fever or weight change.  Respiratory: Negative for cough and shortness of breath.   Cardiovascular: Negative for chest pain or palpitations.  Gastrointestinal: Negative for abdominal pain, no bowel changes.  Musculoskeletal: Negative for gait problem or joint swelling.  Skin: Negative for rash.  Neurological: Negative for dizziness or headache.  No other specific complaints in a complete review of systems (except as listed in HPI above).      Objective:    BP 130/72   Pulse 64   Temp 97.8 F (36.6 C) (Oral)   Resp 16   Ht 5\' 1"  (1.549 m)   Wt 244 lb 4.8 oz (110.8 kg)   SpO2 96%   BMI 46.16 kg/m   Wt Readings from Last 3 Encounters:  08/24/21 244 lb 4.8 oz (110.8 kg)  02/25/21 245 lb 9.6 oz (111.4 kg)  08/18/20 245 lb 8 oz (111.4 kg)    Physical Exam  Constitutional: Patient appears well-developed and  well-nourished. Obese  No distress.  HEENT: head atraumatic, normocephalic, pupils equal and reactive to light, neck supple Cardiovascular: Normal rate, regular rhythm and normal heart sounds.  No murmur heard. No BLE edema. Pulmonary/Chest: Effort normal and breath sounds normal. No respiratory distress. Abdominal: Soft.  There is no tenderness. Psychiatric: Patient has a normal mood and affect. behavior is normal. Judgment and thought content normal.   Results for orders placed or performed in visit on 02/25/21  Lipid panel  Result Value Ref Range   Cholesterol 141 <200 mg/dL   HDL 44 (L) > OR = 50 mg/dL   Triglycerides 206 (H) <150 mg/dL   LDL Cholesterol (Calc) 70 mg/dL (calc)   Total CHOL/HDL Ratio 3.2 <5.0 (calc)   Non-HDL Cholesterol (Calc) 97 <130 mg/dL (calc)  HgB A1c  Result Value Ref Range   Hgb A1c MFr Bld 5.6  <5.7 % of total Hgb   Mean Plasma Glucose 114 mg/dL   eAG (mmol/L) 6.3 mmol/L      Assessment & Plan:   1. Essential hypertension -continue current treatment - CBC with Differential/Platelet - COMPLETE METABOLIC PANEL WITH GFR - olmesartan-hydrochlorothiazide (BENICAR HCT) 20-12.5 MG tablet; Take 1 tablet by mouth daily.  Dispense: 90 tablet; Refill: 3 - metoprolol tartrate (LOPRESSOR) 100 MG tablet; Take 1 tablet (100 mg total) by mouth 2 (two) times daily.  Dispense: 180 tablet; Refill: 3  2. Mixed hyperlipidemia -continue current treatment - Lipid panel - COMPLETE METABOLIC PANEL WITH GFR  3. Gastroesophageal reflux disease -continue current treatment - omeprazole (PRILOSEC) 20 MG capsule; Take 1 capsule (20 mg total) by mouth daily.  Dispense: 90 capsule; Refill: 3  4. Obesity, morbid ([place]) -watch portion sizes -try silver sneakers or water aerobics  5. GAD (generalized anxiety disorder) -continue to monitor for worsening symptoms  6. Encounter for screening mammogram for malignant neoplasm of breast  - MM Digital Screening; Future  7. Need for influenza vaccination  - Flu Vaccine QUAD High Dose(Fluad)   Follow up plan: Return in about 6 months (around 02/22/2022) for follow up.

## 2021-08-26 ENCOUNTER — Ambulatory Visit: Payer: Medicare Other | Attending: Internal Medicine

## 2021-08-26 ENCOUNTER — Other Ambulatory Visit: Payer: Self-pay

## 2021-08-26 DIAGNOSIS — Z23 Encounter for immunization: Secondary | ICD-10-CM

## 2021-08-26 MED ORDER — PFIZER COVID-19 VAC BIVALENT 30 MCG/0.3ML IM SUSP
INTRAMUSCULAR | 0 refills | Status: DC
Start: 2021-08-26 — End: 2022-08-25
  Filled 2021-08-26: qty 0.3, 1d supply, fill #0

## 2021-08-26 NOTE — Progress Notes (Signed)
   Covid-19 Vaccination Clinic  Name:  Hayley Simmons    MRN: [id number] DOB: 12/13/1945  08/26/2021  Ms. Para was observed post Covid-19 immunization for 15 minutes without incident. She was provided with Vaccine Information Sheet and instruction to access the V-Safe system.   [name] was instructed to call 911 with any severe reactions post vaccine: Difficulty breathing  Swelling of face and throat  A fast heartbeat  A bad rash all over body  Dizziness and weakness   Immunizations Administered     Name Date Dose VIS Date Route   Pfizer Covid-19 Vaccine Bivalent Booster 08/26/2021  1:01 PM 0.3 mL 05/19/2021 Intramuscular   Manufacturer: [place]   Lot: [id number]   [place]: [phone]       Covid-19 Vaccination Clinic  Name:  Hayley Simmons    MRN: [id number] DOB: Mar 13, 1946  08/26/2021  [name] was observed post Covid-19 immunization for 15 minutes without incident. She was provided with Vaccine Information Sheet and instruction to access the V-Safe system.   [name] was instructed to call 911 with any severe reactions post vaccine: Difficulty breathing  Swelling of face and throat  A fast heartbeat  A bad rash all over body  Dizziness and weakness   Immunizations Administered     Name Date Dose VIS Date Route   Pfizer Covid-19 Vaccine Bivalent Booster 08/26/2021  1:01 PM 0.3 mL 05/19/2021 Intramuscular   Manufacturer: [place]   Lot: [id number]   [place]: [phone]

## 2021-08-27 ENCOUNTER — Ambulatory Visit: Payer: Medicare Other | Admitting: Physician Assistant

## 2021-09-21 DIAGNOSIS — Z79899 Other long term (current) drug therapy: Secondary | ICD-10-CM | POA: Diagnosis not present

## 2021-09-21 DIAGNOSIS — M059 Rheumatoid arthritis with rheumatoid factor, unspecified: Secondary | ICD-10-CM | POA: Diagnosis not present

## 2021-09-21 DIAGNOSIS — D84821 Immunodeficiency due to drugs: Secondary | ICD-10-CM | POA: Diagnosis not present

## 2021-10-24 ENCOUNTER — Other Ambulatory Visit: Payer: Self-pay | Admitting: Family Medicine

## 2021-10-24 DIAGNOSIS — E785 Hyperlipidemia, unspecified: Secondary | ICD-10-CM

## 2022-01-19 DIAGNOSIS — M0579 Rheumatoid arthritis with rheumatoid factor of multiple sites without organ or systems involvement: Secondary | ICD-10-CM | POA: Diagnosis not present

## 2022-01-19 DIAGNOSIS — Z7960 Long term (current) use of unspecified immunomodulators and immunosuppressants: Secondary | ICD-10-CM | POA: Insufficient documentation

## 2022-01-26 ENCOUNTER — Other Ambulatory Visit: Payer: Self-pay | Admitting: Family Medicine

## 2022-01-26 DIAGNOSIS — E785 Hyperlipidemia, unspecified: Secondary | ICD-10-CM

## 2022-02-23 ENCOUNTER — Other Ambulatory Visit: Payer: Self-pay

## 2022-02-23 ENCOUNTER — Ambulatory Visit (INDEPENDENT_AMBULATORY_CARE_PROVIDER_SITE_OTHER): Payer: Medicare Other | Admitting: Nurse Practitioner

## 2022-02-23 ENCOUNTER — Encounter: Payer: Self-pay | Admitting: Nurse Practitioner

## 2022-02-23 VITALS — BP 128/74 | HR 68 | Temp 97.9°F | Resp 16 | Ht 61.0 in | Wt 247.7 lb

## 2022-02-23 DIAGNOSIS — I10 Essential (primary) hypertension: Secondary | ICD-10-CM

## 2022-02-23 DIAGNOSIS — K219 Gastro-esophageal reflux disease without esophagitis: Secondary | ICD-10-CM | POA: Diagnosis not present

## 2022-02-23 DIAGNOSIS — F3342 Major depressive disorder, recurrent, in full remission: Secondary | ICD-10-CM | POA: Diagnosis not present

## 2022-02-23 DIAGNOSIS — E038 Other specified hypothyroidism: Secondary | ICD-10-CM

## 2022-02-23 DIAGNOSIS — R7989 Other specified abnormal findings of blood chemistry: Secondary | ICD-10-CM | POA: Diagnosis not present

## 2022-02-23 DIAGNOSIS — F411 Generalized anxiety disorder: Secondary | ICD-10-CM | POA: Diagnosis not present

## 2022-02-23 DIAGNOSIS — M059 Rheumatoid arthritis with rheumatoid factor, unspecified: Secondary | ICD-10-CM

## 2022-02-23 DIAGNOSIS — E782 Mixed hyperlipidemia: Secondary | ICD-10-CM

## 2022-02-23 DIAGNOSIS — Z131 Encounter for screening for diabetes mellitus: Secondary | ICD-10-CM

## 2022-02-23 MED ORDER — ROSUVASTATIN CALCIUM 40 MG PO TABS: 40.0000 mg | ORAL_TABLET | Freq: Every evening | ORAL | 1 refills | Status: DC

## 2022-02-23 MED ORDER — BUSPIRONE HCL 5 MG PO TABS: 5.0000 mg | ORAL_TABLET | Freq: Three times a day (TID) | ORAL | 0 refills | Status: DC

## 2022-02-23 NOTE — Assessment & Plan Note (Signed)
Last LDL was 65.  She is currently taking her rosuvastatin 40 mg daily.  Continue with current plan.

## 2022-02-23 NOTE — Assessment & Plan Note (Signed)
Blood pressure at goal today 128/74.  Continue taking metoprolol 100 mg and olmesartan-hydrochlorothiazide 20-12.5 mg daily.

## 2022-02-23 NOTE — Progress Notes (Signed)
BP 128/74   Pulse 68   Temp 97.9 F (36.6 C) (Oral)   Resp 16   Ht _0  (1.549 m)   Wt 247 lb 11.2 oz (112.4 kg)   SpO2 96%   BMI 46.80 kg/m    Subjective:    Patient ID: Hayley Simmons, female    DOB: [birth year], 76 y.o.   MRN: [id number]  HPI: Hayley Simmons is a 76 y.o. female  Chief Complaint  Patient presents with   Hypertension   Hyperlipidemia   Gastroesophageal Reflux    6 month follow up   HTN: She says she occasionally checks her blood pressure at home and its been good.  Her blood pressure today in the office is 128/74.  She is currently taking metoprolol 100 mg and olmesartan-hydrochlorothiazide 20-12.5 mg daily.  She denies any headache, blurred vision, chest pain or shortness of breath.   Hyperlipidemia: Her last LDL was 65 on 08/24/2021.  She is currently taking rosuvastatin 40 mg daily.  She denies any myalgia.  We will get lab work today.   GERD: She reports that her acid reflux has not been giving her issues.  She says she has been able to decrease her omeprazole dose to twice a week.  She tries to stay away from trigger foods.    Obesity: Patient reports she has been struggling with her weight.  Today her weight is 247 pounds with a BMI of 46.8.  Discussed weight loss medication.  She specifically wanted to discuss medication that was a pill and not on injection.  We did discuss Contrave particularly.  She is going to think about it and let me know.  Anxiety/depression: Patient reports that her anxiety has been a little worse since her youngest [name] is getting ready to go off to college.  Patient is requesting medication to help with this.  Discussed trial of BuSpar.  Patient is agreement with plan.  Patient denies any suicidal thoughts.     02/23/2022    8:28 AM 02/23/2022    8:18 AM 08/24/2021    1:19 PM 02/25/2021    8:40 AM 02/25/2021    8:22 AM  Depression screen PHQ 2/9  Decreased Interest 0 0 0 0 0  Down, Depressed, Hopeless 0 0 0 0 0  PHQ - 2  Score 0 0 0 0 0  Altered sleeping 0   0   Tired, decreased energy 0      Change in appetite 0      Feeling bad or failure about yourself  0      Trouble concentrating 0      Moving slowly or fidgety/restless 0      Suicidal thoughts 0      PHQ-9 Score 0   0   Difficult doing work/chores Not difficult at all           02/23/2022    8:42 AM 02/07/2020    8:27 AM 01/18/2018    8:46 AM  GAD 7 : Generalized Anxiety Score  Nervous, Anxious, on Edge 1 0 1  Control/stop worrying 1 1 0  Worry too much - different things 0 0 1  Trouble relaxing 0 0 3  Restless 0 0 2  Easily annoyed or irritable 0 1 1  Afraid - awful might happen 0 0 0  Total GAD 7 Score _1 Anxiety Difficulty Not difficult at all Not difficult at all     Subclinical hypothyroidism: Her  last TSH was 4.29 on 02/07/2020.  Previously her TSH was 4.52.  She denies any palpitations, constipation, changes in skin and hair.  We will get labs today.  RA: Last saw rheumatology on 01/19/2022.  She sees Dr. Posey Pronto in [place] clinic.  She reported that she was doing well.  That she has been taking her methotrexate and tolerating it well.  She denied any new joint pains or swelling.  She tries to stretch and walk regularly.  She denied any skin rash, dyspnea, nasal or oral ulcers or dry cough.   Review of Systems  Constitutional: Negative for fever or weight change.  Respiratory: Negative for cough and shortness of breath.   Cardiovascular: Negative for chest pain or palpitations.  Gastrointestinal: Negative for abdominal pain, no bowel changes.  Musculoskeletal: Negative for gait problem or joint swelling.  Skin: Negative for rash.  Neurological: Negative for dizziness or headache.  No other specific complaints in a complete review of systems (except as listed in HPI above).      Objective:    BP 128/74   Pulse 68   Temp 97.9 F (36.6 C) (Oral)   Resp 16   Ht _0  (1.549 m)   Wt 247 lb 11.2 oz (112.4 kg)   SpO2 96%   BMI  46.80 kg/m   Wt Readings from Last 3 Encounters:  02/23/22 247 lb 11.2 oz (112.4 kg)  08/24/21 244 lb 4.8 oz (110.8 kg)  02/25/21 245 lb 9.6 oz (111.4 kg)    Physical Exam  Constitutional: Patient appears well-developed and well-nourished. Obese  No distress.  HEENT: head atraumatic, normocephalic, pupils equal and reactive to light,  neck supple Cardiovascular: Normal rate, regular rhythm and normal heart sounds.  No murmur heard. No BLE edema. Pulmonary/Chest: Effort normal and breath sounds normal. No respiratory distress. Abdominal: Soft.  There is no tenderness. Psychiatric: Patient has a normal mood and affect. behavior is normal. Judgment and thought content normal.  Results for orders placed or performed in visit on 08/24/21  Lipid panel  Result Value Ref Range   Cholesterol 133 <200 mg/dL   HDL 39 (L) > OR = 50 mg/dL   Triglycerides 233 (H) <150 mg/dL   LDL Cholesterol (Calc) 65 mg/dL (calc)   Total CHOL/HDL Ratio 3.4 <5.0 (calc)   Non-HDL Cholesterol (Calc) 94 <130 mg/dL (calc)  CBC with Differential/Platelet  Result Value Ref Range   WBC 7.5 3.8 - 10.8 Thousand/uL   RBC 4.24 3.80 - 5.10 Million/uL   Hemoglobin 13.0 11.7 - 15.5 g/dL   HCT 39.7 35.0 - 45.0 %   MCV 93.6 80.0 - 100.0 fL   MCH 30.7 27.0 - 33.0 pg   MCHC 32.7 32.0 - 36.0 g/dL   RDW 14.7 11.0 - 15.0 %   Platelets 238 140 - 400 Thousand/uL   MPV 10.2 7.5 - 12.5 fL   Neutro Abs 5,610 1,500 - 7,800 cells/uL   Lymphs Abs 1,223 850 - 3,900 cells/uL   Absolute Monocytes 450 200 - 950 cells/uL   Eosinophils Absolute 188 15 - 500 cells/uL   Basophils Absolute 30 0 - 200 cells/uL   Neutrophils Relative % 74.8 %   Total Lymphocyte 16.3 %   Monocytes Relative 6.0 %   Eosinophils Relative 2.5 %   Basophils Relative 0.4 %  COMPLETE METABOLIC PANEL WITH GFR  Result Value Ref Range   Glucose, Bld 85 65 - 99 mg/dL   BUN 19 7 - 25 mg/dL   Creat  0.95 0.60 - 1.00 mg/dL   eGFR 63 > OR = 60 mL/min/1.79m    BUN/Creatinine Ratio NOT APPLICABLE 6 - 22 (calc)   Sodium 139 135 - 146 mmol/L   Potassium 4.3 3.5 - 5.3 mmol/L   Chloride 100 98 - 110 mmol/L   CO2 31 20 - 32 mmol/L   Calcium 9.1 8.6 - 10.4 mg/dL   Total Protein 6.5 6.1 - 8.1 g/dL   Albumin 4.1 3.6 - 5.1 g/dL   Globulin 2.4 1.9 - 3.7 g/dL (calc)   AG Ratio 1.7 1.0 - 2.5 (calc)   Total Bilirubin 0.8 0.2 - 1.2 mg/dL   Alkaline phosphatase (APISO) 98 37 - 153 U/L   AST 21 10 - 35 U/L   ALT 14 6 - 29 U/L      Assessment & Plan:   Problem List Items Addressed This Visit       Cardiovascular and Mediastinum   Essential hypertension - Primary    Blood pressure at goal today 128/74.  Continue taking metoprolol 100 mg and olmesartan-hydrochlorothiazide 20-12.5 mg daily.       Relevant Medications   rosuvastatin (CRESTOR) 40 MG tablet   Other Relevant Orders   CBC with Differential/Platelet   COMPLETE METABOLIC PANEL WITH GFR     Digestive   GERD (gastroesophageal reflux disease)    She says her acid reflux has been well controlled.  No changes.  Continue taking omeprazole 20 mg 2 times a week.         Endocrine   Subclinical hypothyroidism    Patient denies any symptoms patient also reports she was never told she had any subclinical hypothyroidism.  We will get lab work today.       Relevant Orders   TSH     Musculoskeletal and Integument   Seropositive rheumatoid arthritis ([place]     Other   Hyperlipidemia    Last LDL was 65.  She is currently taking her rosuvastatin 40 mg daily.  Continue with current plan.       Relevant Medications   rosuvastatin (CRESTOR) 40 MG tablet   Other Relevant Orders   Lipid panel   COMPLETE METABOLIC PANEL WITH GFR   Obesity, morbid ([place]    Discussed weight loss medicine specifically Contrave.  Patient is going to think about it and let me know.  She is trying to watch what she eats and be as physically active as her body will allow.       GAD (generalized anxiety disorder)     Patient reports she has been more anxious lately because her youngest grandbaby is getting ready to go off to college.  She asked if she could have a prescription to help with this.  We will send in trial of BuSpar.       Relevant Medications   busPIRone (BUSPAR) 5 MG tablet   Mild major depression ([place]    Patient denies any depression at this time.       Relevant Medications   busPIRone (BUSPAR) 5 MG tablet   Other Visit Diagnoses     Screening for diabetes mellitus       Relevant Orders   COMPLETE METABOLIC PANEL WITH GFR        Follow up plan: Return in about 6 months (around 08/25/2022) for follow up.

## 2022-02-23 NOTE — Assessment & Plan Note (Signed)
Patient reports she has been more anxious lately because her youngest grandbaby is getting ready to go off to college.  She asked if she could have a prescription to help with this.  We will send in trial of BuSpar.

## 2022-02-23 NOTE — Assessment & Plan Note (Signed)
Patient denies any depression at this time.

## 2022-02-23 NOTE — Assessment & Plan Note (Signed)
Patient denies any symptoms patient also reports she was never told she had any subclinical hypothyroidism.  We will get lab work today.

## 2022-02-23 NOTE — Assessment & Plan Note (Signed)
Discussed weight loss medicine specifically Contrave.  Patient is going to think about it and let me know.  She is trying to watch what she eats and be as physically active as her body will allow.

## 2022-02-23 NOTE — Assessment & Plan Note (Signed)
She says her acid reflux has been well controlled.  No changes.  Continue taking omeprazole 20 mg 2 times a week.

## 2022-02-24 ENCOUNTER — Other Ambulatory Visit: Payer: Self-pay | Admitting: Nurse Practitioner

## 2022-02-24 DIAGNOSIS — E038 Other specified hypothyroidism: Secondary | ICD-10-CM

## 2022-02-24 DIAGNOSIS — R7989 Other specified abnormal findings of blood chemistry: Secondary | ICD-10-CM

## 2022-02-24 LAB — COMPLETE METABOLIC PANEL WITH GFR
AG Ratio: 1.5 (calc) (ref 1.0–2.5)
ALT: 17 U/L (ref 6–29)
AST: 22 U/L (ref 10–35)
Albumin: 4.1 g/dL (ref 3.6–5.1)
Alkaline phosphatase (APISO): 102 U/L (ref 37–153)
BUN: 21 mg/dL (ref 7–25)
CO2: 32 mmol/L (ref 20–32)
Calcium: 9.6 mg/dL (ref 8.6–10.4)
Chloride: 102 mmol/L (ref 98–110)
Creat: 0.96 mg/dL (ref 0.60–1.00)
Globulin: 2.7 g/dL (calc) (ref 1.9–3.7)
Glucose, Bld: 92 mg/dL (ref 65–99)
Potassium: 4.1 mmol/L (ref 3.5–5.3)
Sodium: 142 mmol/L (ref 135–146)
Total Bilirubin: 0.7 mg/dL (ref 0.2–1.2)
Total Protein: 6.8 g/dL (ref 6.1–8.1)
eGFR: 62 mL/min/{1.73_m2} (ref >=60)

## 2022-02-24 LAB — CBC WITH DIFFERENTIAL/PLATELET
Absolute Monocytes: 286 cells/uL (ref 200–950)
Basophils Absolute: 48 cells/uL (ref 0–200)
Basophils Relative: 0.7 %
Eosinophils Absolute: 122 cells/uL (ref 15–500)
Eosinophils Relative: 1.8 %
HCT: 40.3 % (ref 35.0–45.0)
Hemoglobin: 13.5 g/dL (ref 11.7–15.5)
Lymphs Abs: 1591 cells/uL (ref 850–3900)
MCH: 30.9 pg (ref 27.0–33.0)
MCHC: 33.5 g/dL (ref 32.0–36.0)
MCV: 92.2 fL (ref 80.0–100.0)
MPV: 10.1 fL (ref 7.5–12.5)
Monocytes Relative: 4.2 %
Neutro Abs: 4753 cells/uL (ref 1500–7800)
Neutrophils Relative %: 69.9 %
Platelets: 224 10*3/uL (ref 140–400)
RBC: 4.37 10*6/uL (ref 3.80–5.10)
RDW: 14.3 % (ref 11.0–15.0)
Total Lymphocyte: 23.4 %
WBC: 6.8 10*3/uL (ref 3.8–10.8)

## 2022-02-24 LAB — LIPID PANEL
Cholesterol: 142 mg/dL (ref <200)
HDL: 43 mg/dL — ABNORMAL LOW (ref >=50)
LDL Cholesterol (Calc): 71 mg/dL (calc)
Non-HDL Cholesterol (Calc): 99 mg/dL (calc) (ref <130)
Total CHOL/HDL Ratio: 3.3 (calc) (ref <5.0)
Triglycerides: 214 mg/dL — ABNORMAL HIGH (ref <150)

## 2022-02-24 LAB — T3, FREE: T3, Free: 2.8 pg/mL (ref 2.3–4.2)

## 2022-02-24 LAB — TEST AUTHORIZATION

## 2022-02-24 LAB — T4, FREE: Free T4: 1.1 ng/dL (ref 0.8–1.8)

## 2022-02-24 LAB — TSH: TSH: 5.28 mIU/L — ABNORMAL HIGH (ref 0.40–4.50)

## 2022-02-25 ENCOUNTER — Other Ambulatory Visit: Payer: Self-pay | Admitting: Nurse Practitioner

## 2022-02-25 ENCOUNTER — Encounter: Payer: Self-pay | Admitting: Nurse Practitioner

## 2022-02-25 DIAGNOSIS — Z23 Encounter for immunization: Secondary | ICD-10-CM

## 2022-02-25 MED ORDER — SHINGRIX 50 MCG/0.5ML IM SUSR: 0.5000 mL | Freq: Once | INTRAMUSCULAR | 0 refills | Status: AC

## 2022-02-25 MED ORDER — TETANUS-DIPHTH-ACELL PERTUSSIS 5-2-15.5 LF-MCG/0.5 IM SUSP: 0.5000 mL | Freq: Once | INTRAMUSCULAR | 0 refills | Status: AC

## 2022-02-25 MED ORDER — CONTRAVE 8-90 MG PO TB12: ORAL_TABLET | ORAL | 0 refills | Status: DC

## 2022-03-01 ENCOUNTER — Other Ambulatory Visit: Payer: Self-pay | Admitting: Nurse Practitioner

## 2022-03-01 DIAGNOSIS — Z23 Encounter for immunization: Secondary | ICD-10-CM

## 2022-03-02 NOTE — Telephone Encounter (Signed)
Filled 02/25/22.

## 2022-05-24 DIAGNOSIS — Z7960 Long term (current) use of unspecified immunomodulators and immunosuppressants: Secondary | ICD-10-CM | POA: Diagnosis not present

## 2022-05-24 DIAGNOSIS — M0579 Rheumatoid arthritis with rheumatoid factor of multiple sites without organ or systems involvement: Secondary | ICD-10-CM | POA: Diagnosis not present

## 2022-05-24 DIAGNOSIS — M170 Bilateral primary osteoarthritis of knee: Secondary | ICD-10-CM | POA: Diagnosis not present

## 2022-07-13 DIAGNOSIS — Z1231 Encounter for screening mammogram for malignant neoplasm of breast: Secondary | ICD-10-CM | POA: Diagnosis not present

## 2022-07-13 LAB — HM MAMMOGRAPHY

## 2022-07-21 DIAGNOSIS — Z23 Encounter for immunization: Secondary | ICD-10-CM | POA: Diagnosis not present

## 2022-08-24 NOTE — Progress Notes (Unsigned)
There were no vitals taken for this visit.   Subjective:    Patient ID: Hayley Simmons, female    DOB: 04/24/1946, 76 y.o.   MRN: [id number]  HPI: Hayley Simmons is a 76 y.o. female  No chief complaint on file.  HTN:  She says she occasionally checks her blood pressure at home and its been good.  Her blood pressure today in the office is 128/74.  She is currently taking metoprolol 100 mg and olmesartan-hydrochlorothiazide 20-12.5 mg daily.  She denies any headache, blurred vision, chest pain or shortness of breath.   CKD 3:  Hyperlipidemia:  Her last LDL was 65 on 08/24/2021.  She is currently taking rosuvastatin 40 mg daily.  She denies any myalgia.  We will get lab work today.   GERD:  She reports that her acid reflux has not been giving her issues.  She says she has been able to decrease her omeprazole dose to twice a week.  She tries to stay away from trigger foods.    Obesity:  Patient reports she has been struggling with her weight.  Today her weight is 247 pounds with a BMI of 46.8.  Discussed weight loss medication.  She specifically wanted to discuss medication that was a pill and not on injection.  We did discuss Contrave particularly.  She is going to think about it and let me know.  Anxiety/depression:  Patient reports that her anxiety has been a little worse since her youngest [name] is getting ready to go off to college.  Patient is requesting medication to help with this.  Discussed trial of BuSpar.  Patient is agreement with plan.  Patient denies any suicidal thoughts.     02/23/2022    8:28 AM 02/23/2022    8:18 AM 08/24/2021    1:19 PM 02/25/2021    8:40 AM 02/25/2021    8:22 AM  Depression screen PHQ 2/9  Decreased Interest 0 0 0 0 0  Down, Depressed, Hopeless 0 0 0 0 0  PHQ - 2 Score 0 0 0 0 0  Altered sleeping 0   0   Tired, decreased energy 0      Change in appetite 0      Feeling bad or failure about yourself  0      Trouble concentrating 0      Moving  slowly or fidgety/restless 0      Suicidal thoughts 0      PHQ-9 Score 0   0   Difficult doing work/chores Not difficult at all           02/23/2022    8:42 AM 02/07/2020    8:27 AM 01/18/2018    8:46 AM  GAD 7 : Generalized Anxiety Score  Nervous, Anxious, on Edge 1 0 1  Control/stop worrying 1 1 0  Worry too much - different things 0 0 1  Trouble relaxing 0 0 3  Restless 0 0 2  Easily annoyed or irritable 0 1 1  Afraid - awful might happen 0 0 0  Total GAD 7 Score _0 Anxiety Difficulty Not difficult at all Not difficult at all     Subclinical hypothyroidism:  Her last TSH was 4.29 on 02/07/2020.  Previously her TSH was 4.52.  She denies any palpitations, constipation, changes in skin and hair.  We will get labs today.  RA:  Last saw rheumatology on 01/19/2022.  She sees Dr. Posey Pronto in [place] [facility].  She reported that she was doing well.  That she has been taking her methotrexate and tolerating it well.  She denied any new joint pains or swelling.  She tries to stretch and walk regularly.  She denied any skin rash, dyspnea, nasal or oral ulcers or dry cough.   Review of Systems  Constitutional: Negative for fever or weight change.  Respiratory: Negative for cough and shortness of breath.   Cardiovascular: Negative for chest pain or palpitations.  Gastrointestinal: Negative for abdominal pain, no bowel changes.  Musculoskeletal: Negative for gait problem or joint swelling.  Skin: Negative for rash.  Neurological: Negative for dizziness or headache.  No other specific complaints in a complete review of systems (except as listed in HPI above).      Objective:    There were no vitals taken for this visit.  Wt Readings from Last 3 Encounters:  02/23/22 247 lb 11.2 oz (112.4 kg)  08/24/21 244 lb 4.8 oz (110.8 kg)  02/25/21 245 lb 9.6 oz (111.4 kg)    Physical Exam  Constitutional: Patient appears well-developed and well-nourished. Obese  No distress.  HEENT: head  atraumatic, normocephalic, pupils equal and reactive to light,  neck supple Cardiovascular: Normal rate, regular rhythm and normal heart sounds.  No murmur heard. No BLE edema. Pulmonary/Chest: Effort normal and breath sounds normal. No respiratory distress. Abdominal: Soft.  There is no tenderness. Psychiatric: Patient has a normal mood and affect. behavior is normal. Judgment and thought content normal.  Results for orders placed or performed in visit on 02/23/22  Lipid panel  Result Value Ref Range   Cholesterol 142 <200 mg/dL   HDL 43 (L) > OR = 50 mg/dL   Triglycerides 214 (H) <150 mg/dL   LDL Cholesterol (Calc) 71 mg/dL (calc)   Total CHOL/HDL Ratio 3.3 <5.0 (calc)   Non-HDL Cholesterol (Calc) 99 <130 mg/dL (calc)  CBC with Differential/Platelet  Result Value Ref Range   WBC 6.8 3.8 - 10.8 Thousand/uL   RBC 4.37 3.80 - 5.10 Million/uL   Hemoglobin 13.5 11.7 - 15.5 g/dL   HCT 40.3 35.0 - 45.0 %   MCV 92.2 80.0 - 100.0 fL   MCH 30.9 27.0 - 33.0 pg   MCHC 33.5 32.0 - 36.0 g/dL   RDW 14.3 11.0 - 15.0 %   Platelets 224 140 - 400 Thousand/uL   MPV 10.1 7.5 - 12.5 fL   Neutro Abs 4,753 1,500 - 7,800 cells/uL   Lymphs Abs 1,591 850 - 3,900 cells/uL   Absolute Monocytes 286 200 - 950 cells/uL   Eosinophils Absolute 122 15 - 500 cells/uL   Basophils Absolute 48 0 - 200 cells/uL   Neutrophils Relative % 69.9 %   Total Lymphocyte 23.4 %   Monocytes Relative 4.2 %   Eosinophils Relative 1.8 %   Basophils Relative 0.7 %  COMPLETE METABOLIC PANEL WITH GFR  Result Value Ref Range   Glucose, Bld 92 65 - 99 mg/dL   BUN 21 7 - 25 mg/dL   Creat 0.96 0.60 - 1.00 mg/dL   eGFR 62 > OR = 60 mL/min/1.57m   BUN/Creatinine Ratio NOT APPLICABLE 6 - 22 (calc)   Sodium 142 135 - 146 mmol/L   Potassium 4.1 3.5 - 5.3 mmol/L   Chloride 102 98 - 110 mmol/L   CO2 32 20 - 32 mmol/L   Calcium 9.6 8.6 - 10.4 mg/dL   Total Protein 6.8 6.1 - 8.1 g/dL   Albumin 4.1 3.6 - 5.1 g/dL  Globulin 2.7 1.9 -  3.7 g/dL (calc)   AG Ratio 1.5 1.0 - 2.5 (calc)   Total Bilirubin 0.7 0.2 - 1.2 mg/dL   Alkaline phosphatase (APISO) 102 37 - 153 U/L   AST 22 10 - 35 U/L   ALT 17 6 - 29 U/L  TSH  Result Value Ref Range   TSH 5.28 (H) 0.40 - 4.50 mIU/L  T4, free  Result Value Ref Range   Free T4 1.1 0.8 - 1.8 ng/dL  T3, free  Result Value Ref Range   T3, Free 2.8 2.3 - 4.2 pg/mL  TEST AUTHORIZATION  Result Value Ref Range   TEST NAME: T3, FREE T4, FREE    TEST CODE: [id number]    CLIENT CONTACT: LESLIE SMITH    REPORT ALWAYS MESSAGE SIGNATURE        Assessment & Plan:   Problem List Items Addressed This Visit   None    Follow up plan: No follow-ups on file.

## 2022-08-25 ENCOUNTER — Encounter: Payer: Self-pay | Admitting: Nurse Practitioner

## 2022-08-25 ENCOUNTER — Other Ambulatory Visit: Payer: Self-pay

## 2022-08-25 ENCOUNTER — Ambulatory Visit (INDEPENDENT_AMBULATORY_CARE_PROVIDER_SITE_OTHER): Payer: Medicare Other | Admitting: Nurse Practitioner

## 2022-08-25 VITALS — BP 130/84 | HR 82 | Temp 98.4°F | Resp 16 | Ht 61.0 in | Wt 249.9 lb

## 2022-08-25 DIAGNOSIS — D84821 Immunodeficiency due to drugs: Secondary | ICD-10-CM | POA: Diagnosis not present

## 2022-08-25 DIAGNOSIS — N183 Chronic kidney disease, stage 3 unspecified: Secondary | ICD-10-CM | POA: Diagnosis not present

## 2022-08-25 DIAGNOSIS — E038 Other specified hypothyroidism: Secondary | ICD-10-CM

## 2022-08-25 DIAGNOSIS — F330 Major depressive disorder, recurrent, mild: Secondary | ICD-10-CM | POA: Diagnosis not present

## 2022-08-25 DIAGNOSIS — I10 Essential (primary) hypertension: Secondary | ICD-10-CM | POA: Diagnosis not present

## 2022-08-25 DIAGNOSIS — Z79899 Other long term (current) drug therapy: Secondary | ICD-10-CM

## 2022-08-25 DIAGNOSIS — E782 Mixed hyperlipidemia: Secondary | ICD-10-CM | POA: Diagnosis not present

## 2022-08-25 DIAGNOSIS — F411 Generalized anxiety disorder: Secondary | ICD-10-CM

## 2022-08-25 DIAGNOSIS — M059 Rheumatoid arthritis with rheumatoid factor, unspecified: Secondary | ICD-10-CM | POA: Diagnosis not present

## 2022-08-25 DIAGNOSIS — K219 Gastro-esophageal reflux disease without esophagitis: Secondary | ICD-10-CM | POA: Diagnosis not present

## 2022-08-25 MED ORDER — METOPROLOL TARTRATE 100 MG PO TABS: 100.0000 mg | ORAL_TABLET | Freq: Two times a day (BID) | ORAL | 3 refills | Status: DC

## 2022-08-25 MED ORDER — OMEPRAZOLE 20 MG PO CPDR: 20.0000 mg | DELAYED_RELEASE_CAPSULE | Freq: Every day | ORAL | 3 refills | Status: DC

## 2022-08-25 MED ORDER — ROSUVASTATIN CALCIUM 40 MG PO TABS: 40.0000 mg | ORAL_TABLET | Freq: Every evening | ORAL | 1 refills | Status: DC

## 2022-08-25 MED ORDER — OLMESARTAN MEDOXOMIL-HCTZ 20-12.5 MG PO TABS: 1.0000 | ORAL_TABLET | Freq: Every day | ORAL | 3 refills | Status: DC

## 2022-08-25 NOTE — Assessment & Plan Note (Signed)
Getting labs

## 2022-08-25 NOTE — Assessment & Plan Note (Signed)
improved

## 2022-08-25 NOTE — Assessment & Plan Note (Signed)
Patient more stressed lately due to abnormal mammogram. Does not feel she needs meds at this time.

## 2022-08-25 NOTE — Assessment & Plan Note (Signed)
Continue taking methotrexate 20 mg weekly and Folic acid 1 mg Daily

## 2022-08-25 NOTE — Assessment & Plan Note (Signed)
Continue taking rosuvastatin 40 mg daily.   

## 2022-08-25 NOTE — Assessment & Plan Note (Signed)
Continue taking metoprolol 100 mg daily and olmesartan-hydrochlorothiazide 10-12.5 mg daily.

## 2022-08-25 NOTE — Assessment & Plan Note (Signed)
Continue follow up with rheumatologist and continue to take methotrexate 20 mg weekly and Folic acid 1 mg Daily

## 2022-08-25 NOTE — Assessment & Plan Note (Signed)
Continue taking omeprazole twice a week and avoid food triggers

## 2022-08-26 DIAGNOSIS — R928 Other abnormal and inconclusive findings on diagnostic imaging of breast: Secondary | ICD-10-CM | POA: Diagnosis not present

## 2022-08-26 DIAGNOSIS — N641 Fat necrosis of breast: Secondary | ICD-10-CM | POA: Diagnosis not present

## 2022-08-26 LAB — COMPLETE METABOLIC PANEL WITH GFR
AG Ratio: 1.7 (calc) (ref 1.0–2.5)
ALT: 15 U/L (ref 6–29)
AST: 18 U/L (ref 10–35)
Albumin: 4.2 g/dL (ref 3.6–5.1)
Alkaline phosphatase (APISO): 96 U/L (ref 37–153)
BUN/Creatinine Ratio: 18 (calc) (ref 6–22)
BUN: 19 mg/dL (ref 7–25)
CO2: 32 mmol/L (ref 20–32)
Calcium: 9.8 mg/dL (ref 8.6–10.4)
Chloride: 102 mmol/L (ref 98–110)
Creat: 1.05 mg/dL — ABNORMAL HIGH (ref 0.60–1.00)
Globulin: 2.5 g/dL (calc) (ref 1.9–3.7)
Glucose, Bld: 94 mg/dL (ref 65–99)
Potassium: 3.9 mmol/L (ref 3.5–5.3)
Sodium: 142 mmol/L (ref 135–146)
Total Bilirubin: 0.5 mg/dL (ref 0.2–1.2)
Total Protein: 6.7 g/dL (ref 6.1–8.1)
eGFR: 56 mL/min/{1.73_m2} — ABNORMAL LOW (ref >=60)

## 2022-08-26 LAB — CBC WITH DIFFERENTIAL/PLATELET
Absolute Monocytes: 384 cells/uL (ref 200–950)
Basophils Absolute: 30 cells/uL (ref 0–200)
Basophils Relative: 0.5 %
Eosinophils Absolute: 153 cells/uL (ref 15–500)
Eosinophils Relative: 2.6 %
HCT: 37.7 % (ref 35.0–45.0)
Hemoglobin: 12.5 g/dL (ref 11.7–15.5)
Lymphs Abs: 1375 cells/uL (ref 850–3900)
MCH: 30.9 pg (ref 27.0–33.0)
MCHC: 33.2 g/dL (ref 32.0–36.0)
MCV: 93.1 fL (ref 80.0–100.0)
MPV: 10 fL (ref 7.5–12.5)
Monocytes Relative: 6.5 %
Neutro Abs: 3959 cells/uL (ref 1500–7800)
Neutrophils Relative %: 67.1 %
Platelets: 215 10*3/uL (ref 140–400)
RBC: 4.05 10*6/uL (ref 3.80–5.10)
RDW: 14.5 % (ref 11.0–15.0)
Total Lymphocyte: 23.3 %
WBC: 5.9 10*3/uL (ref 3.8–10.8)

## 2022-08-26 LAB — LIPID PANEL
Cholesterol: 143 mg/dL (ref <200)
HDL: 39 mg/dL — ABNORMAL LOW (ref >=50)
LDL Cholesterol (Calc): 69 mg/dL (calc)
Non-HDL Cholesterol (Calc): 104 mg/dL (calc) (ref <130)
Total CHOL/HDL Ratio: 3.7 (calc) (ref <5.0)
Triglycerides: 263 mg/dL — ABNORMAL HIGH (ref <150)

## 2022-08-26 LAB — THYROID PANEL WITH TSH
Free Thyroxine Index: 2.3 (ref 1.4–3.8)
T3 Uptake: 30 % (ref 22–35)
T4, Total: 7.5 ug/dL (ref 5.1–11.9)
TSH: 4.41 mIU/L (ref 0.40–4.50)

## 2022-10-11 DIAGNOSIS — R058 Other specified cough: Secondary | ICD-10-CM | POA: Diagnosis not present

## 2022-10-25 DIAGNOSIS — Z79899 Other long term (current) drug therapy: Secondary | ICD-10-CM | POA: Diagnosis not present

## 2022-10-25 DIAGNOSIS — M0579 Rheumatoid arthritis with rheumatoid factor of multiple sites without organ or systems involvement: Secondary | ICD-10-CM | POA: Diagnosis not present

## 2022-10-25 DIAGNOSIS — D84821 Immunodeficiency due to drugs: Secondary | ICD-10-CM | POA: Diagnosis not present

## 2022-10-25 DIAGNOSIS — Z7960 Long term (current) use of unspecified immunomodulators and immunosuppressants: Secondary | ICD-10-CM | POA: Diagnosis not present

## 2022-11-01 DIAGNOSIS — H2513 Age-related nuclear cataract, bilateral: Secondary | ICD-10-CM | POA: Diagnosis not present

## 2022-11-01 DIAGNOSIS — H353112 Nonexudative age-related macular degeneration, right eye, intermediate dry stage: Secondary | ICD-10-CM | POA: Diagnosis not present

## 2022-11-01 DIAGNOSIS — H353221 Exudative age-related macular degeneration, left eye, with active choroidal neovascularization: Secondary | ICD-10-CM | POA: Diagnosis not present

## 2022-11-02 DIAGNOSIS — H43813 Vitreous degeneration, bilateral: Secondary | ICD-10-CM | POA: Diagnosis not present

## 2022-11-02 DIAGNOSIS — H353112 Nonexudative age-related macular degeneration, right eye, intermediate dry stage: Secondary | ICD-10-CM | POA: Diagnosis not present

## 2022-11-02 DIAGNOSIS — H353221 Exudative age-related macular degeneration, left eye, with active choroidal neovascularization: Secondary | ICD-10-CM | POA: Diagnosis not present

## 2022-11-30 DIAGNOSIS — H353221 Exudative age-related macular degeneration, left eye, with active choroidal neovascularization: Secondary | ICD-10-CM | POA: Diagnosis not present

## 2022-11-30 DIAGNOSIS — H353112 Nonexudative age-related macular degeneration, right eye, intermediate dry stage: Secondary | ICD-10-CM | POA: Diagnosis not present

## 2022-11-30 DIAGNOSIS — H43813 Vitreous degeneration, bilateral: Secondary | ICD-10-CM | POA: Diagnosis not present

## 2023-01-05 DIAGNOSIS — H43813 Vitreous degeneration, bilateral: Secondary | ICD-10-CM | POA: Diagnosis not present

## 2023-01-05 DIAGNOSIS — H353112 Nonexudative age-related macular degeneration, right eye, intermediate dry stage: Secondary | ICD-10-CM | POA: Diagnosis not present

## 2023-01-05 DIAGNOSIS — H353221 Exudative age-related macular degeneration, left eye, with active choroidal neovascularization: Secondary | ICD-10-CM | POA: Diagnosis not present

## 2023-02-16 DIAGNOSIS — H43813 Vitreous degeneration, bilateral: Secondary | ICD-10-CM | POA: Diagnosis not present

## 2023-02-16 DIAGNOSIS — H353112 Nonexudative age-related macular degeneration, right eye, intermediate dry stage: Secondary | ICD-10-CM | POA: Diagnosis not present

## 2023-02-16 DIAGNOSIS — H353221 Exudative age-related macular degeneration, left eye, with active choroidal neovascularization: Secondary | ICD-10-CM | POA: Diagnosis not present

## 2023-02-19 ENCOUNTER — Other Ambulatory Visit: Payer: Self-pay | Admitting: Nurse Practitioner

## 2023-02-19 DIAGNOSIS — E782 Mixed hyperlipidemia: Secondary | ICD-10-CM

## 2023-02-20 NOTE — Telephone Encounter (Signed)
Requested Prescriptions  Pending Prescriptions Disp Refills   rosuvastatin (CRESTOR) 40 MG tablet [Pharmacy Med Name: ROSUVASTATIN 40MG  TABLETS] 90 tablet 0    Sig: TAKE 1 TABLET(40 MG) BY MOUTH EVERY EVENING     Cardiovascular:  Antilipid - Statins 2 Failed - 02/19/2023 10:01 AM      Failed - Cr in normal range and within 360 days    Creat  Date Value Ref Range Status  08/25/2022 1.05 (H) 0.60 - 1.00 mg/dL Final   Creatinine, Urine  Date Value Ref Range Status  04/11/2017 186 20 - 320 mg/dL Final         Failed - Lipid Panel in normal range within the last 12 months    Cholesterol, Total  Date Value Ref Range Status  03/21/2016 169 100 - 199 mg/dL Final   Cholesterol  Date Value Ref Range Status  08/25/2022 143 <200 mg/dL Final   LDL Cholesterol (Calc)  Date Value Ref Range Status  08/25/2022 69 mg/dL (calc) Final    Comment:    Reference range: <100 . Desirable range <100 mg/dL for primary prevention;   <70 mg/dL for patients with CHD or diabetic patients  with > or = 2 CHD risk factors. Marland Kitchen LDL-C is now calculated using the Martin-Hopkins  calculation, which is a validated novel method providing  better accuracy than the Friedewald equation in the  estimation of LDL-C.  Horald Pollen et al. Lenox Ahr. 1610;960(45): 2061-2068  (http://education.[facility].com/faq/FAQ164)    HDL  Date Value Ref Range Status  08/25/2022 39 (L) > OR = 50 mg/dL Final  40/98/1191 35 (L) >39 mg/dL Final   Triglycerides  Date Value Ref Range Status  08/25/2022 263 (H) <150 mg/dL Final    Comment:    . If a non-fasting specimen was collected, consider repeat triglyceride testing on a fasting specimen if clinically indicated.  Perry Mount et al. J. of Clin. Lipidol. 2015;9:129-169. Marland Kitchen          Passed - Patient is not pregnant      Passed - Valid encounter within last 12 months    Recent Outpatient Visits           5 months ago Essential hypertension   [facility] Berniece Salines, FNP   12 months ago Essential hypertension   [facility] Berniece Salines, FNP   1 year ago Essential hypertension   [facility] Berniece Salines, FNP   1 year ago Essential hypertension   [facility] Gabriel Cirri, NP   2 years ago Essential hypertension   [facility] Jamelle Haring, MD       Future Appointments             In 4 days Zane Herald, Rudolpho Sevin, FNP [facility], [facility]

## 2023-02-23 NOTE — Progress Notes (Signed)
BP 128/76   Pulse 67   Temp 97.7 F (36.5 C) (Oral)   Resp 16   Ht 5\' 1"  (1.549 m)   Wt 245 lb 11.2 oz (111.4 kg)   SpO2 94%   BMI 46.42 kg/m    Subjective:    Patient ID: Hayley Simmons, female    DOB: 1946/03/06, 77 y.o.   MRN: [id number]  HPI: Hayley Simmons is a 77 y.o. female  Chief Complaint  Patient presents with   Hypertension   Hyperlipidemia   Gastroesophageal Reflux   Hypertension:  -Medications: metoprolol 100 mg daily, olmesartan-hydrochlorothiazide 10-12.5 mg daily -Patient is compliant with above medications and reports no side effects. -Checking BP at home (average): 120/80s -Denies any SOB, CP, vision changes, LE edema or symptoms of hypotension -Diet: recommend lower salt diet -Exercise: recommend increasing physical activity as tolerated, like walking       02/24/2023    7:58 AM 08/25/2022    8:02 AM 02/23/2022    8:14 AM  Vitals with BMI  Height 5\' 1"  5\' 1"  5\' 1"   Weight 245 lbs 11 oz 249 lbs 14 oz 247 lbs 11 oz  BMI 46.45 47.24 46.83  Systolic 128 130 409  Diastolic 76 84 74  Pulse 67 82 68    CKD:  -CKD status: worse, GFR 48 on 10/25/2022, will recheck today -Last Creatinine: 1.2 -Medications renally dose: yes -Previous renal evaluation: no -Pneumovax:  Up to Date -Influenza Vaccine:  Up to Date   HLD:  -Medications: rosuvastatin 40 mg daily -Patient is compliant with above medications and reports no side effects.  -Last lipid panel: LDL 69 Lipid Panel     Component Value Date/Time   CHOL 143 08/25/2022 0852   CHOL 169 03/21/2016 0914   TRIG 263 (H) 08/25/2022 0852   HDL 39 (L) 08/25/2022 0852   HDL 35 (L) 03/21/2016 0914   CHOLHDL 3.7 08/25/2022 0852   VLDL 38 02/07/2018 1107   LDLCALC 69 08/25/2022 0852   LABVLDL 50 (H) 03/21/2016 0914      GERD control status: controlledSatisfied with current treatment? yes Heartburn frequency:  Medication side effects: no  Medication compliance: better and stable Dysphagia:  no Odynophagia:  no Hematemesis: no Blood in stool: no EGD: no    Obesity: her weight today is 245 lbs with a BMI of 46.42.  Last appointment weight was 249 lbs. Co morbidities include HLD, HTN, GERD, anxiety and depression. Continue working on lifestyle modification.   Anxiety/depression: she reports that her anxiety is high.  She says that the buspar would give her a headache.  She says that she is stressed because of her husbands health. She says he is battling melenoma.  Will trial lexapro.      02/24/2023    8:18 AM 02/24/2023    8:01 AM 08/25/2022    8:10 AM 02/23/2022    8:28 AM 02/23/2022    8:18 AM  Depression screen PHQ 2/9  Decreased Interest 0 0 3 0 0  Down, Depressed, Hopeless 1 1 3  0 0  PHQ - 2 Score 1 1 6  0 0  Altered sleeping 3  3 0   Tired, decreased energy 0  0 0   Change in appetite 0  0 0   Feeling bad or failure about yourself  0  0 0   Trouble concentrating 3  3 0   Moving slowly or fidgety/restless 0  3 0   Suicidal thoughts 0  0  0   PHQ-9 Score 7  15 0   Difficult doing work/chores Somewhat difficult  Not difficult at all Not difficult at all        02/24/2023    8:18 AM 08/25/2022    8:11 AM 02/23/2022    8:42 AM 02/07/2020    8:27 AM  GAD 7 : Generalized Anxiety Score  Nervous, Anxious, on Edge 3 3 1  0  Control/stop worrying 3 3 1 1   Worry too much - different things 3 3 0 0  Trouble relaxing 3 3 0 0  Restless 3 3 0 0  Easily annoyed or irritable 3 3 0 1  Afraid - awful might happen 3 3 0 0  Total GAD 7 Score 21 21 2 2   Anxiety Difficulty Somewhat difficult Somewhat difficult Not difficult at all Not difficult at all    Subclinical hypothyroidism: Last TSH was in normal range, will continue to monitor.  Denies any fatigue, palpitations, constipation.  RA/immunocompromised due to med: She is under the care of rheumatology last seen by Dr. Allena Katz at [facility] on 10/25/2022. Plan was to Continue Methotrexate 20 mg weekly and Folic acid 1 mg Daily.  Patient reports she is taking her medication as prescribed. No changes.    Health maintenance: Due for labs Due for colonoscopy ( patient declined) AWV  Review of Systems  Constitutional: Negative for fever or weight change.  Respiratory: Negative for cough and shortness of breath.   Cardiovascular: Negative for chest pain or palpitations.  Gastrointestinal: Negative for abdominal pain, no bowel changes.  Musculoskeletal: Negative for gait problem or joint swelling.  Skin: Negative for rash.  Neurological: Negative for dizziness or headache.  No other specific complaints in a complete review of systems (except as listed in HPI above).      Objective:    BP 128/76   Pulse 67   Temp 97.7 F (36.5 C) (Oral)   Resp 16   Ht 5\' 1"  (1.549 m)   Wt 245 lb 11.2 oz (111.4 kg)   SpO2 94%   BMI 46.42 kg/m   Wt Readings from Last 3 Encounters:  02/24/23 245 lb 11.2 oz (111.4 kg)  08/25/22 249 lb 14.4 oz (113.4 kg)  02/23/22 247 lb 11.2 oz (112.4 kg)    Physical Exam  Constitutional: Patient appears well-developed and well-nourished. Obese  No distress.  HEENT: head atraumatic, normocephalic, pupils equal and reactive to light,  neck supple Cardiovascular: Normal rate, regular rhythm and normal heart sounds.  No murmur heard. No BLE edema. Pulmonary/Chest: Effort normal and breath sounds normal. No respiratory distress. Abdominal: Soft.  There is no tenderness. Psychiatric: Patient has a normal mood and affect. behavior is normal. Judgment and thought content normal.  Results for orders placed or performed in visit on 08/25/22  Thyroid Panel With TSH  Result Value Ref Range   T3 Uptake 30 22 - 35 %   T4, Total 7.5 5.1 - 11.9 mcg/dL   Free Thyroxine Index 2.3 1.4 - 3.8   TSH 4.41 0.40 - 4.50 mIU/L  CBC with Differential/Platelet  Result Value Ref Range   WBC 5.9 3.8 - 10.8 Thousand/uL   RBC 4.05 3.80 - 5.10 Million/uL   Hemoglobin 12.5 11.7 - 15.5 g/dL   HCT 24.4 01.0 - 27.2  %   MCV 93.1 80.0 - 100.0 fL   MCH 30.9 27.0 - 33.0 pg   MCHC 33.2 32.0 - 36.0 g/dL   RDW 53.6 64.4 - 03.4 %  Platelets 215 140 - 400 Thousand/uL   MPV 10.0 7.5 - 12.5 fL   Neutro Abs 3,959 1,500 - 7,800 cells/uL   Lymphs Abs 1,375 850 - 3,900 cells/uL   Absolute Monocytes 384 200 - 950 cells/uL   Eosinophils Absolute 153 15 - 500 cells/uL   Basophils Absolute 30 0 - 200 cells/uL   Neutrophils Relative % 67.1 %   Total Lymphocyte 23.3 %   Monocytes Relative 6.5 %   Eosinophils Relative 2.6 %   Basophils Relative 0.5 %  COMPLETE METABOLIC PANEL WITH GFR  Result Value Ref Range   Glucose, Bld 94 65 - 99 mg/dL   BUN 19 7 - 25 mg/dL   Creat 1.61 (H) 0.96 - 1.00 mg/dL   eGFR 56 (L) > OR = 60 mL/min/1.40m2   BUN/Creatinine Ratio 18 6 - 22 (calc)   Sodium 142 135 - 146 mmol/L   Potassium 3.9 3.5 - 5.3 mmol/L   Chloride 102 98 - 110 mmol/L   CO2 32 20 - 32 mmol/L   Calcium 9.8 8.6 - 10.4 mg/dL   Total Protein 6.7 6.1 - 8.1 g/dL   Albumin 4.2 3.6 - 5.1 g/dL   Globulin 2.5 1.9 - 3.7 g/dL (calc)   AG Ratio 1.7 1.0 - 2.5 (calc)   Total Bilirubin 0.5 0.2 - 1.2 mg/dL   Alkaline phosphatase (APISO) 96 37 - 153 U/L   AST 18 10 - 35 U/L   ALT 15 6 - 29 U/L  Lipid panel  Result Value Ref Range   Cholesterol 143 <200 mg/dL   HDL 39 (L) > OR = 50 mg/dL   Triglycerides 045 (H) <150 mg/dL   LDL Cholesterol (Calc) 69 mg/dL (calc)   Total CHOL/HDL Ratio 3.7 <5.0 (calc)   Non-HDL Cholesterol (Calc) 104 <130 mg/dL (calc)      Assessment & Plan:   Problem List Items Addressed This Visit       Cardiovascular and Mediastinum   Essential hypertension - Primary    -Medications: metoprolol 100 mg daily, olmesartan-hydrochlorothiazide 10-12.5 mg daily       Relevant Medications   rosuvastatin (CRESTOR) 40 MG tablet   olmesartan-hydrochlorothiazide (BENICAR HCT) 20-12.5 MG tablet   metoprolol tartrate (LOPRESSOR) 100 MG tablet   Other Relevant Orders   CBC with Differential/Platelet    COMPLETE METABOLIC PANEL WITH GFR     Digestive   GERD (gastroesophageal reflux disease)    Continue omeprazole 20 mg as needed      Relevant Medications   omeprazole (PRILOSEC) 20 MG capsule     Endocrine   Subclinical hypothyroidism    Rechecking labs.      Relevant Medications   metoprolol tartrate (LOPRESSOR) 100 MG tablet   Other Relevant Orders   Thyroid Panel With TSH     Musculoskeletal and Integument   Seropositive rheumatoid arthritis (HCC)    Followed by rheumatology Dr. Allena Katz at [facility].  Methotrexate 20 mg weekly        Genitourinary   Chronic kidney disease, stage III (moderate) (HCC)    Rechecking labs      Relevant Orders   COMPLETE METABOLIC PANEL WITH GFR     Other   Hyperlipidemia    Continue rosuvastatin 40 mg daily.      Relevant Medications   rosuvastatin (CRESTOR) 40 MG tablet   olmesartan-hydrochlorothiazide (BENICAR HCT) 20-12.5 MG tablet   metoprolol tartrate (LOPRESSOR) 100 MG tablet   Other Relevant Orders   COMPLETE METABOLIC PANEL WITH  GFR   Lipid panel   Obesity, morbid (HCC)    Continue working on lifestyle modification including well-balanced diet with portion control and physical activity as tolerated.      GAD (generalized anxiety disorder)    Start Lexapro 10 mg daily follow-up in 4 weeks      Relevant Medications   escitalopram (LEXAPRO) 10 MG tablet   Immunocompromised state due to drug therapy (HCC)    Followed by rheumatology currently on methotrexate      Mild episode of recurrent major depressive disorder (HCC)    Trial Lexapro 10 mg daily.  Follow-up in 4 weeks.      Relevant Medications   escitalopram (LEXAPRO) 10 MG tablet   Other Visit Diagnoses     Screening for diabetes mellitus       Relevant Orders   COMPLETE METABOLIC PANEL WITH GFR   Hemoglobin A1c        Follow up plan: Return in about 6 months (around 08/26/2023) for follow up, needs awv .

## 2023-02-24 ENCOUNTER — Encounter: Payer: Self-pay | Admitting: Nurse Practitioner

## 2023-02-24 ENCOUNTER — Ambulatory Visit (INDEPENDENT_AMBULATORY_CARE_PROVIDER_SITE_OTHER): Payer: Medicare Other | Admitting: Nurse Practitioner

## 2023-02-24 ENCOUNTER — Other Ambulatory Visit: Payer: Self-pay

## 2023-02-24 VITALS — BP 128/76 | HR 67 | Temp 97.7°F | Resp 16 | Ht 61.0 in | Wt 245.7 lb

## 2023-02-24 DIAGNOSIS — Z131 Encounter for screening for diabetes mellitus: Secondary | ICD-10-CM | POA: Diagnosis not present

## 2023-02-24 DIAGNOSIS — E782 Mixed hyperlipidemia: Secondary | ICD-10-CM | POA: Diagnosis not present

## 2023-02-24 DIAGNOSIS — Z79899 Other long term (current) drug therapy: Secondary | ICD-10-CM | POA: Diagnosis not present

## 2023-02-24 DIAGNOSIS — N183 Chronic kidney disease, stage 3 unspecified: Secondary | ICD-10-CM

## 2023-02-24 DIAGNOSIS — F330 Major depressive disorder, recurrent, mild: Secondary | ICD-10-CM | POA: Diagnosis not present

## 2023-02-24 DIAGNOSIS — E038 Other specified hypothyroidism: Secondary | ICD-10-CM | POA: Diagnosis not present

## 2023-02-24 DIAGNOSIS — F411 Generalized anxiety disorder: Secondary | ICD-10-CM

## 2023-02-24 DIAGNOSIS — D84821 Immunodeficiency due to drugs: Secondary | ICD-10-CM | POA: Diagnosis not present

## 2023-02-24 DIAGNOSIS — K219 Gastro-esophageal reflux disease without esophagitis: Secondary | ICD-10-CM

## 2023-02-24 DIAGNOSIS — M059 Rheumatoid arthritis with rheumatoid factor, unspecified: Secondary | ICD-10-CM

## 2023-02-24 DIAGNOSIS — I10 Essential (primary) hypertension: Secondary | ICD-10-CM | POA: Diagnosis not present

## 2023-02-24 LAB — CBC WITH DIFFERENTIAL/PLATELET
Absolute Monocytes: 315 cells/uL (ref 200–950)
Basophils Relative: 0.4 %
HCT: 39.2 % (ref 35.0–45.0)
Monocytes Relative: 3.7 %
RDW: 14.2 % (ref 11.0–15.0)

## 2023-02-24 MED ORDER — OLMESARTAN MEDOXOMIL-HCTZ 20-12.5 MG PO TABS
1.0000 | ORAL_TABLET | Freq: Every day | ORAL | 3 refills | Status: DC
Start: 2023-02-24 — End: 2024-03-28

## 2023-02-24 MED ORDER — ESCITALOPRAM OXALATE 10 MG PO TABS
10.0000 mg | ORAL_TABLET | Freq: Every day | ORAL | 0 refills | Status: DC
Start: 2023-02-24 — End: 2023-08-29

## 2023-02-24 MED ORDER — OMEPRAZOLE 20 MG PO CPDR
20.0000 mg | DELAYED_RELEASE_CAPSULE | Freq: Every day | ORAL | 3 refills | Status: AC
Start: 2023-02-24 — End: ?

## 2023-02-24 MED ORDER — ROSUVASTATIN CALCIUM 40 MG PO TABS: ORAL_TABLET | ORAL | 1 refills | Status: DC

## 2023-02-24 MED ORDER — METOPROLOL TARTRATE 100 MG PO TABS: 100.0000 mg | ORAL_TABLET | Freq: Two times a day (BID) | ORAL | 3 refills | Status: DC

## 2023-02-24 NOTE — Assessment & Plan Note (Signed)
Rechecking labs 

## 2023-02-24 NOTE — Assessment & Plan Note (Signed)
Followed by rheumatology currently on methotrexate

## 2023-02-24 NOTE — Assessment & Plan Note (Signed)
-  Medications: metoprolol 100 mg daily, olmesartan-hydrochlorothiazide 10-12.5 mg daily

## 2023-02-24 NOTE — Assessment & Plan Note (Signed)
Continue rosuvastatin 40mg daily  

## 2023-02-24 NOTE — Assessment & Plan Note (Signed)
Start Lexapro 10 mg daily follow-up in 4 weeks 

## 2023-02-24 NOTE — Assessment & Plan Note (Signed)
Continue working on lifestyle modification including well-balanced diet with portion control and physical activity as tolerated.

## 2023-02-24 NOTE — Assessment & Plan Note (Signed)
Continue omeprazole 20 mg as needed 

## 2023-02-24 NOTE — Assessment & Plan Note (Signed)
Trial Lexapro 10 mg daily.  Follow-up in 4 weeks.

## 2023-02-24 NOTE — Assessment & Plan Note (Signed)
Followed by rheumatology Dr. Allena Katz at [place] [facility].  Methotrexate 20 mg weekly

## 2023-02-25 LAB — COMPLETE METABOLIC PANEL WITH GFR
AG Ratio: 1.5 (calc) (ref 1.0–2.5)
ALT: 20 U/L (ref 6–29)
AST: 18 U/L (ref 10–35)
Albumin: 4 g/dL (ref 3.6–5.1)
Alkaline phosphatase (APISO): 96 U/L (ref 37–153)
BUN/Creatinine Ratio: 22 (calc) (ref 6–22)
BUN: 22 mg/dL (ref 7–25)
CO2: 30 mmol/L (ref 20–32)
Calcium: 9.5 mg/dL (ref 8.6–10.4)
Chloride: 104 mmol/L (ref 98–110)
Creat: 1.01 mg/dL — ABNORMAL HIGH (ref 0.60–1.00)
Globulin: 2.7 g/dL (calc) (ref 1.9–3.7)
Glucose, Bld: 103 mg/dL — ABNORMAL HIGH (ref 65–99)
Potassium: 4.3 mmol/L (ref 3.5–5.3)
Sodium: 142 mmol/L (ref 135–146)
Total Bilirubin: 0.5 mg/dL (ref 0.2–1.2)
Total Protein: 6.7 g/dL (ref 6.1–8.1)
eGFR: 58 mL/min/{1.73_m2} — ABNORMAL LOW (ref >=60)

## 2023-02-25 LAB — LIPID PANEL
Cholesterol: 152 mg/dL (ref <200)
HDL: 44 mg/dL — ABNORMAL LOW (ref >=50)
LDL Cholesterol (Calc): 78 mg/dL (calc)
Non-HDL Cholesterol (Calc): 108 mg/dL (calc) (ref <130)
Total CHOL/HDL Ratio: 3.5 (calc) (ref <5.0)
Triglycerides: 205 mg/dL — ABNORMAL HIGH (ref <150)

## 2023-02-25 LAB — CBC WITH DIFFERENTIAL/PLATELET
Basophils Absolute: 34 cells/uL (ref 0–200)
Eosinophils Absolute: 179 cells/uL (ref 15–500)
Eosinophils Relative: 2.1 %
Hemoglobin: 13 g/dL (ref 11.7–15.5)
Lymphs Abs: 1343 cells/uL (ref 850–3900)
MCH: 30.4 pg (ref 27.0–33.0)
MCHC: 33.2 g/dL (ref 32.0–36.0)
MCV: 91.8 fL (ref 80.0–100.0)
MPV: 10 fL (ref 7.5–12.5)
Neutro Abs: 6630 cells/uL (ref 1500–7800)
Neutrophils Relative %: 78 %
Platelets: 231 10*3/uL (ref 140–400)
RBC: 4.27 10*6/uL (ref 3.80–5.10)
Total Lymphocyte: 15.8 %
WBC: 8.5 10*3/uL (ref 3.8–10.8)

## 2023-02-25 LAB — THYROID PANEL WITH TSH
Free Thyroxine Index: 2.1 (ref 1.4–3.8)
T3 Uptake: 30 % (ref 22–35)
T4, Total: 7.1 ug/dL (ref 5.1–11.9)
TSH: 5.96 mIU/L — ABNORMAL HIGH (ref 0.40–4.50)

## 2023-02-25 LAB — HEMOGLOBIN A1C
Hgb A1c MFr Bld: 5.9 % of total Hgb — ABNORMAL HIGH (ref <5.7)
Mean Plasma Glucose: 123 mg/dL
eAG (mmol/L): 6.8 mmol/L

## 2023-03-09 DIAGNOSIS — Z7960 Long term (current) use of unspecified immunomodulators and immunosuppressants: Secondary | ICD-10-CM | POA: Diagnosis not present

## 2023-03-09 DIAGNOSIS — M0579 Rheumatoid arthritis with rheumatoid factor of multiple sites without organ or systems involvement: Secondary | ICD-10-CM | POA: Diagnosis not present

## 2023-04-13 DIAGNOSIS — H43813 Vitreous degeneration, bilateral: Secondary | ICD-10-CM | POA: Diagnosis not present

## 2023-04-13 DIAGNOSIS — H353221 Exudative age-related macular degeneration, left eye, with active choroidal neovascularization: Secondary | ICD-10-CM | POA: Diagnosis not present

## 2023-04-13 DIAGNOSIS — H353112 Nonexudative age-related macular degeneration, right eye, intermediate dry stage: Secondary | ICD-10-CM | POA: Diagnosis not present

## 2023-05-24 ENCOUNTER — Other Ambulatory Visit: Payer: Self-pay | Admitting: Nurse Practitioner

## 2023-05-24 DIAGNOSIS — E782 Mixed hyperlipidemia: Secondary | ICD-10-CM

## 2023-05-24 NOTE — Telephone Encounter (Signed)
Medication Refill - Medication:  rosuvastatin (CRESTOR) 40 MG tablet   Has the patient contacted their pharmacy? Yes.   Pt told to contact provider  Preferred Pharmacy (with phone number or street name):  [id number] - [place], [place] - [address] AT [facility] OF [address] [facility] Phone: [phone]  Fax: [phone]     Has the patient been seen for an appointment in the last year OR does the patient have an upcoming appointment? Yes.    Agent: Please be advised that RX refills may take up to 3 business days. We ask that you follow-up with your pharmacy.

## 2023-05-25 NOTE — Telephone Encounter (Signed)
Requested Prescriptions  Pending Prescriptions Disp Refills   rosuvastatin (CRESTOR) 40 MG tablet 90 tablet 1    Sig: TAKE 1 TABLET(40 MG) BY MOUTH EVERY EVENING     Cardiovascular:  Antilipid - Statins 2 Failed - 05/24/2023 10:11 AM      Failed - Cr in normal range and within 360 days    Creat  Date Value Ref Range Status  02/24/2023 1.01 (H) 0.60 - 1.00 mg/dL Final   Creatinine, Urine  Date Value Ref Range Status  04/11/2017 186 20 - 320 mg/dL Final         Failed - Lipid Panel in normal range within the last 12 months    Cholesterol, Total  Date Value Ref Range Status  03/21/2016 169 100 - 199 mg/dL Final   Cholesterol  Date Value Ref Range Status  02/24/2023 152 <200 mg/dL Final   LDL Cholesterol (Calc)  Date Value Ref Range Status  02/24/2023 78 mg/dL (calc) Final    Comment:    Reference range: <100 . Desirable range <100 mg/dL for primary prevention;   <70 mg/dL for patients with CHD or diabetic patients  with > or = 2 CHD risk factors. Marland Kitchen LDL-C is now calculated using the Martin-Hopkins  calculation, which is a validated novel method providing  better accuracy than the Friedewald equation in the  estimation of LDL-C.  Horald Pollen et al. Lenox Ahr. 1610;960(45): 2061-2068  (http://education.[facility].com/faq/FAQ164)    HDL  Date Value Ref Range Status  02/24/2023 44 (L) > OR = 50 mg/dL Final  40/98/1191 35 (L) >39 mg/dL Final   Triglycerides  Date Value Ref Range Status  02/24/2023 205 (H) <150 mg/dL Final    Comment:    . If a non-fasting specimen was collected, consider repeat triglyceride testing on a fasting specimen if clinically indicated.  Perry Mount et al. J. of Clin. Lipidol. 2015;9:129-169. Marland Kitchen          Passed - Patient is not pregnant      Passed - Valid encounter within last 12 months    Recent Outpatient Visits           3 months ago Essential hypertension   [facility] Berniece Salines, FNP   9 months ago  Essential hypertension   [facility] Berniece Salines, FNP   1 year ago Essential hypertension   [facility] Berniece Salines, FNP   1 year ago Essential hypertension   [facility] Berniece Salines, FNP   2 years ago Essential hypertension   [facility] Gabriel Cirri, NP       Future Appointments             In 3 months Zane Herald, Rudolpho Sevin, FNP [facility]

## 2023-05-25 NOTE — Telephone Encounter (Signed)
Called pharmacy - pt has 2 rf available. Rx will be refilled. It will be ready on Saturday for P/U.

## 2023-06-22 DIAGNOSIS — H353112 Nonexudative age-related macular degeneration, right eye, intermediate dry stage: Secondary | ICD-10-CM | POA: Diagnosis not present

## 2023-06-22 DIAGNOSIS — H43813 Vitreous degeneration, bilateral: Secondary | ICD-10-CM | POA: Diagnosis not present

## 2023-06-22 DIAGNOSIS — H353221 Exudative age-related macular degeneration, left eye, with active choroidal neovascularization: Secondary | ICD-10-CM | POA: Diagnosis not present

## 2023-07-11 ENCOUNTER — Encounter: Payer: Self-pay | Admitting: Nurse Practitioner

## 2023-07-13 DIAGNOSIS — M170 Bilateral primary osteoarthritis of knee: Secondary | ICD-10-CM | POA: Diagnosis not present

## 2023-07-13 DIAGNOSIS — Z7960 Long term (current) use of unspecified immunomodulators and immunosuppressants: Secondary | ICD-10-CM | POA: Diagnosis not present

## 2023-07-13 DIAGNOSIS — M0579 Rheumatoid arthritis with rheumatoid factor of multiple sites without organ or systems involvement: Secondary | ICD-10-CM | POA: Diagnosis not present

## 2023-07-14 DIAGNOSIS — R052 Subacute cough: Secondary | ICD-10-CM | POA: Diagnosis not present

## 2023-07-17 ENCOUNTER — Encounter: Payer: Self-pay | Admitting: Family Medicine

## 2023-07-17 DIAGNOSIS — Z1231 Encounter for screening mammogram for malignant neoplasm of breast: Secondary | ICD-10-CM | POA: Diagnosis not present

## 2023-07-17 LAB — HM MAMMOGRAPHY

## 2023-08-08 DIAGNOSIS — Z23 Encounter for immunization: Secondary | ICD-10-CM | POA: Diagnosis not present

## 2023-08-16 ENCOUNTER — Other Ambulatory Visit: Payer: Self-pay | Admitting: Nurse Practitioner

## 2023-08-16 DIAGNOSIS — I10 Essential (primary) hypertension: Secondary | ICD-10-CM

## 2023-08-16 NOTE — Telephone Encounter (Signed)
Rx 02/24/23 #180 3RF- 1 year supply- too soon Requested Prescriptions  Pending Prescriptions Disp Refills   metoprolol tartrate (LOPRESSOR) 100 MG tablet [Pharmacy Med Name: METOPROLOL TARTRATE 100MG  TABLETS] 180 tablet 3    Sig: TAKE 1 TABLET(100 MG) BY MOUTH TWICE DAILY     Cardiovascular:  Beta Blockers Passed - 08/16/2023  4:02 PM      Passed - Last BP in normal range    BP Readings from Last 1 Encounters:  02/24/23 128/76         Passed - Last Heart Rate in normal range    Pulse Readings from Last 1 Encounters:  02/24/23 67         Passed - Valid encounter within last 6 months    Recent Outpatient Visits           5 months ago Essential hypertension   [facility] Berniece Salines, FNP   11 months ago Essential hypertension   [facility]ce [facility]ines[facility] F[facility]   1 year ago Essential hypertension   [facility] Berniece Salines, FNP   1 year ago Essential hypertension   [facility] Berniece Salines, FNP   2 years ago Essential hypertension   [facility] Gabriel Cirri, NP       Future Appointments             In 1 week Zane Herald, Rudolpho Sevin, FNP [facility], [facility]

## 2023-08-16 NOTE — Progress Notes (Signed)
BP 126/82 (BP Location: Left Arm, Patient Position: Sitting, Cuff Size: Large)   Pulse 60   Temp 97.9 F (36.6 C) (Oral)   Resp 16   Ht 5\' 2"  (1.575 m)   Wt 241 lb 1.6 oz (109.4 kg)   SpO2 93%   BMI 44.10 kg/m    Subjective:    Patient ID: Hayley Simmons, female    DOB: 1945/12/30, 77 y.o.   MRN: [id number]  HPI: Hayley Simmons is a 77 y.o. female  Chief Complaint  Patient presents with   Follow-up   Cough   overactive bladder    Discussed the use of AI scribe software for clinical note transcription with the patient, who gave verbal consent to proceed.  History of Present Illness   The patient, with a history of hypertension, chronic kidney disease, hyperlipidemia, acid reflux, obesity, anxiety and depression, subclinical hypothyroidism, and rheumatoid arthritis, presents with a persistent cough and overactive bladder. The patient's blood pressure is well controlled on metoprolol and olmesartan-hydrochlorothiazide, and she denies any chest pain, shortness of breath, headaches, or blurred vision. The patient's kidney function is stable with a GFR of 58. The patient's hyperlipidemia is well managed on rosuvastatin, and her acid reflux is controlled on omeprazole. The patient's weight is 241 pounds with a BMI of 44.1, and she has been advised to make lifestyle modifications. The patient has a history of anxiety and depression, previously managed with Lexapro, but is currently not on any medication for these conditions. The patient's subclinical hypothyroidism is being monitored. The patient's rheumatoid arthritis is stable on methotrexate. The patient has been experiencing an overactive bladder for years and is seeking medication for this issue. The patient also reports a persistent cough, which has been present since October and has recurred despite previous treatment with azithromycin, prednisone. The patient denies any wheezing or feeling unwell, but reports coughing up phlegm.         08/29/2023    7:50 AM 02/24/2023    7:58 AM 08/25/2022    8:02 AM  Vitals with BMI  Height 5\' 2"  5\' 1"  5\' 1"   Weight 241 lbs 2 oz 245 lbs 11 oz 249 lbs 14 oz  BMI 44.09 46.45 47.24  Systolic 126 128 295  Diastolic 82 76 84  Pulse 60 67 82     Lipid Panel     Component Value Date/Time   CHOL 152 02/24/2023 0837   CHOL 169 03/21/2016 0914   TRIG 205 (H) 02/24/2023 0837   HDL 44 (L) 02/24/2023 0837   HDL 35 (L) 03/21/2016 0914   CHOLHDL 3.5 02/24/2023 0837   VLDL 38 02/07/2018 1107   LDLCALC 78 02/24/2023 0837   LABVLDL 50 (H) 03/21/2016 0914         08/29/2023    7:56 AM 02/24/2023    8:18 AM 02/24/2023    8:01 AM 08/25/2022    8:10 AM 02/23/2022    8:28 AM  Depression screen PHQ 2/9  Decreased Interest 1 0 0 3 0  Down, Depressed, Hopeless 1 1 1 3  0  PHQ - 2 Score 2 1 1 6  0  Altered sleeping 1 3  3  0  Tired, decreased energy 1 0  0 0  Change in appetite 0 0  0 0  Feeling bad or failure about yourself  0 0  0 0  Trouble concentrating 0 3  3 0  Moving slowly or fidgety/restless 0 0  3 0  Suicidal thoughts 0  0  0 0  PHQ-9 Score 4 7  15  0  Difficult doing work/chores Not difficult at all Somewhat difficult  Not difficult at all Not difficult at all       08/29/2023    7:57 AM 02/24/2023    8:18 AM 08/25/2022    8:11 AM 02/23/2022    8:42 AM  GAD 7 : Generalized Anxiety Score  Nervous, Anxious, on Edge 0 3 3 1   Control/stop worrying 1 3 3 1   Worry too much - different things 1 3 3  0  Trouble relaxing 0 3 3 0  Restless 0 3 3 0  Easily annoyed or irritable 1 3 3  0  Afraid - awful might happen 1 3 3  0  Total GAD 7 Score 4 21 21 2   Anxiety Difficulty Not difficult at all Somewhat difficult Somewhat difficult Not difficult at all     Review of Systems  Constitutional: Negative for fever or weight change.  Respiratory: Negative for cough and shortness of breath.   Cardiovascular: Negative for chest pain or palpitations.  Gastrointestinal: Negative for abdominal pain,  no bowel changes.  Musculoskeletal: Negative for gait problem or joint swelling.  Skin: Negative for rash.  Neurological: Negative for dizziness or headache.  No other specific complaints in a complete review of systems (except as listed in HPI above).      Objective:    BP 126/82 (BP Location: Left Arm, Patient Position: Sitting, Cuff Size: Large)   Pulse 60   Temp 97.9 F (36.6 C) (Oral)   Resp 16   Ht 5\' 2"  (1.575 m)   Wt 241 lb 1.6 oz (109.4 kg)   SpO2 93%   BMI 44.10 kg/m   Wt Readings from Last 3 Encounters:  08/29/23 241 lb 1.6 oz (109.4 kg)  02/24/23 245 lb 11.2 oz (111.4 kg)  08/25/22 249 lb 14.4 oz (113.4 kg)    Physical Exam  Constitutional: Patient appears well-developed and well-nourished. Obese  No distress.  HEENT: head atraumatic, normocephalic, pupils equal and reactive to light,  neck supple Cardiovascular: Normal rate, regular rhythm and normal heart sounds.  No murmur heard. No BLE edema. Pulmonary/Chest: Effort normal and breath sounds normal. No respiratory distress. Abdominal: Soft.  There is no tenderness. Psychiatric: Patient has a normal mood and affect. behavior is normal. Judgment and thought content normal.  Results for orders placed or performed in visit on 07/17/23  HM MAMMOGRAPHY  Result Value Ref Range   HM Mammogram 0-4 Bi-Rad 0-4 Bi-Rad, Self Reported Normal      Assessment & Plan:   Problem List Items Addressed This Visit       Cardiovascular and Mediastinum   Essential hypertension - Primary   Relevant Medications   rosuvastatin (CRESTOR) 40 MG tablet   Other Relevant Orders   COMPLETE METABOLIC PANEL WITH GFR   CBC with Differential/Platelet     Digestive   GERD (gastroesophageal reflux disease)     Endocrine   Subclinical hypothyroidism   Relevant Orders   TSH     Musculoskeletal and Integument   Seropositive rheumatoid arthritis (HCC)   Relevant Medications   methotrexate (RHEUMATREX) 2.5 MG tablet   predniSONE  (STERAPRED UNI-PAK 21 TAB) 10 MG (21) TBPK tablet     Genitourinary   Chronic kidney disease, stage III (moderate) (HCC)   Relevant Orders   COMPLETE METABOLIC PANEL WITH GFR     Other   Hyperlipidemia   Relevant Medications   rosuvastatin (CRESTOR) 40  MG tablet   Other Relevant Orders   COMPLETE METABOLIC PANEL WITH GFR   Lipid panel   Obesity, morbid (HCC)    Comorbidities include HTN. HLD, GERD, Depression, anxiety, HLD      GAD (generalized anxiety disorder)   Relevant Medications   hydrOXYzine (VISTARIL) 25 MG capsule   Immunocompromised state due to drug therapy (HCC)   Mild episode of recurrent major depressive disorder (HCC)   Relevant Medications   hydrOXYzine (VISTARIL) 25 MG capsule   Other Visit Diagnoses     Overactive bladder       Relevant Medications   oxybutynin (DITROPAN) 5 MG tablet   Persistent cough       Relevant Medications   predniSONE (STERAPRED UNI-PAK 21 TAB) 10 MG (21) TBPK tablet   benzonatate (TESSALON) 100 MG capsule   albuterol (VENTOLIN HFA) 108 (90 Base) MCG/ACT inhaler   Other Relevant Orders   DG Chest 2 View   CBC with Differential/Platelet       Assessment and Plan    Hypertension Well controlled on Metoprolol 100mg  daily and Olmesartan-Hydrochlorothiazide 10-12.5mg  daily. -Continue current medications.  Chronic Kidney Disease, Stage 3 Last GFR was 58 on 02/24/2023. -Monitor renal function.  Hyperlipidemia Tolerating Rosuvastatin 40mg  daily well. -Continue Rosuvastatin 40mg  daily.  Gastroesophageal Reflux Disease Well controlled on Omeprazole 20mg  daily. -Continue Omeprazole 20mg  daily.  Obesity Current weight is 241 pounds with a BMI of 44.1. -Recommend lifestyle modification.  Anxiety and Depression Increased anxiety due to husband's illness. Previously tried Lexapro 10mg  daily but discontinued due to headaches. -Prescribe Hydroxyzine for as-needed use.  Subclinical Hypothyroidism -Recheck TSH  today.  Rheumatoid Arthritis Stable on Methotrexate 20mg  weekly. -Continue Methotrexate 20mg  weekly.  Overactive Bladder Patient reports longstanding symptoms and desires medication. -Prescribe Oxybutane 0.5mg  twice daily.  Persistent Cough Recurrent cough since October, recently returned after a course of Azithromycin and Prednisone. -Prescribe Albuterol inhaler, Tessalon Perles, and Prednisone. -Optional chest X-ray if cough worsens.         Follow up plan: Return in about 6 months (around 02/27/2024) for follow up.

## 2023-08-29 ENCOUNTER — Telehealth: Payer: Self-pay

## 2023-08-29 ENCOUNTER — Ambulatory Visit (INDEPENDENT_AMBULATORY_CARE_PROVIDER_SITE_OTHER): Payer: Medicare Other | Admitting: Nurse Practitioner

## 2023-08-29 ENCOUNTER — Encounter: Payer: Self-pay | Admitting: Nurse Practitioner

## 2023-08-29 VITALS — BP 126/82 | HR 60 | Temp 97.9°F | Resp 16 | Ht 62.0 in | Wt 241.1 lb

## 2023-08-29 DIAGNOSIS — Z79899 Other long term (current) drug therapy: Secondary | ICD-10-CM

## 2023-08-29 DIAGNOSIS — R053 Chronic cough: Secondary | ICD-10-CM

## 2023-08-29 DIAGNOSIS — F411 Generalized anxiety disorder: Secondary | ICD-10-CM | POA: Diagnosis not present

## 2023-08-29 DIAGNOSIS — I10 Essential (primary) hypertension: Secondary | ICD-10-CM | POA: Diagnosis not present

## 2023-08-29 DIAGNOSIS — N183 Chronic kidney disease, stage 3 unspecified: Secondary | ICD-10-CM | POA: Diagnosis not present

## 2023-08-29 DIAGNOSIS — F330 Major depressive disorder, recurrent, mild: Secondary | ICD-10-CM

## 2023-08-29 DIAGNOSIS — N3281 Overactive bladder: Secondary | ICD-10-CM | POA: Diagnosis not present

## 2023-08-29 DIAGNOSIS — D84821 Immunodeficiency due to drugs: Secondary | ICD-10-CM

## 2023-08-29 DIAGNOSIS — E782 Mixed hyperlipidemia: Secondary | ICD-10-CM

## 2023-08-29 DIAGNOSIS — E038 Other specified hypothyroidism: Secondary | ICD-10-CM | POA: Diagnosis not present

## 2023-08-29 DIAGNOSIS — M059 Rheumatoid arthritis with rheumatoid factor, unspecified: Secondary | ICD-10-CM

## 2023-08-29 DIAGNOSIS — K219 Gastro-esophageal reflux disease without esophagitis: Secondary | ICD-10-CM | POA: Diagnosis not present

## 2023-08-29 MED ORDER — ROSUVASTATIN CALCIUM 40 MG PO TABS: ORAL_TABLET | ORAL | 1 refills | Status: DC

## 2023-08-29 MED ORDER — PREDNISONE 10 MG (21) PO TBPK: ORAL_TABLET | ORAL | 0 refills | Status: DC

## 2023-08-29 MED ORDER — BENZONATATE 100 MG PO CAPS: 200.0000 mg | ORAL_CAPSULE | Freq: Two times a day (BID) | ORAL | 0 refills | Status: DC | PRN

## 2023-08-29 MED ORDER — ALBUTEROL SULFATE HFA 108 (90 BASE) MCG/ACT IN AERS: 2.0000 | INHALATION_SPRAY | Freq: Four times a day (QID) | RESPIRATORY_TRACT | 0 refills | Status: DC | PRN

## 2023-08-29 MED ORDER — OXYBUTYNIN CHLORIDE 5 MG PO TABS: 2.5000 mg | ORAL_TABLET | Freq: Two times a day (BID) | ORAL | 1 refills | Status: DC

## 2023-08-29 MED ORDER — HYDROXYZINE PAMOATE 25 MG PO CAPS: 25.0000 mg | ORAL_CAPSULE | Freq: Three times a day (TID) | ORAL | 0 refills | Status: DC | PRN

## 2023-08-29 NOTE — Progress Notes (Signed)
BP 126/82 (BP Location: Left Arm, Patient Position: Sitting, Cuff Size: Large)   Pulse 60   Temp 97.9 F (36.6 C) (Oral)   Resp 16   Ht 5\' 2"  (1.575 m)   Wt 241 lb 1.6 oz (109.4 kg)   SpO2 93%   BMI 44.10 kg/m    Subjective:    Patient ID: Hayley Simmons, female    DOB: [birth year], 77 y.o.   MRN: [id number]  HPI: Hayley Simmons is a 77 y.o. female  Chief Complaint  Patient presents with   Follow-up   Cough   overactive bladder    Discussed the use of AI scribe software for clinical note transcription with the patient, who gave verbal consent to proceed.  History of Present Illness   The patient, with a history of hypertension, chronic kidney disease, hyperlipidemia, acid reflux, obesity, anxiety and depression, subclinical hypothyroidism, and rheumatoid arthritis, presents with a persistent cough and overactive bladder. The patient's blood pressure is well controlled on metoprolol and olmesartan-hydrochlorothiazide, and she denies any chest pain, shortness of breath, headaches, or blurred vision. The patient's kidney function is stable with a GFR of 58. The patient's hyperlipidemia is well managed on rosuvastatin, and her acid reflux is controlled on omeprazole. The patient's weight is 241 pounds with a BMI of 44.1, and she has been advised to make lifestyle modifications. The patient has a history of anxiety and depression, previously managed with Lexapro, but is currently not on any medication for these conditions. The patient's subclinical hypothyroidism is being monitored. The patient's rheumatoid arthritis is stable on methotrexate. The patient has been experiencing an overactive bladder for years and is seeking medication for this issue. The patient also reports a persistent cough, which has been present since October and has recurred despite previous treatment with azithromycin, prednisone. The patient denies any wheezing or feeling unwell, but reports coughing up phlegm.        08/29/2023    7:56 AM 02/24/2023    8:18 AM 02/24/2023    8:01 AM  Depression screen PHQ 2/9  Decreased Interest 1 0 0  Down, Depressed, Hopeless 1 1 1   PHQ - 2 Score 2 1 1   Altered sleeping 1 3   Tired, decreased energy 1 0   Change in appetite 0 0   Feeling bad or failure about yourself  0 0   Trouble concentrating 0 3   Moving slowly or fidgety/restless 0 0   Suicidal thoughts 0 0   PHQ-9 Score 4 7   Difficult doing work/chores Not difficult at all Somewhat difficult     Relevant past medical, surgical, family and social history reviewed and updated as indicated. Interim medical history since our last visit reviewed. Allergies and medications reviewed and updated.  Review of Systems  Per HPI unless specifically indicated above     Objective:    BP 126/82 (BP Location: Left Arm, Patient Position: Sitting, Cuff Size: Large)   Pulse 60   Temp 97.9 F (36.6 C) (Oral)   Resp 16   Ht 5\' 2"  (1.575 m)   Wt 241 lb 1.6 oz (109.4 kg)   SpO2 93%   BMI 44.10 kg/m    Wt Readings from Last 3 Encounters:  08/29/23 241 lb 1.6 oz (109.4 kg)  02/24/23 245 lb 11.2 oz (111.4 kg)  08/25/22 249 lb 14.4 oz (113.4 kg)    Physical Exam  Results for orders placed or performed in visit on 07/17/23  HM MAMMOGRAPHY  Result Value Ref Range   HM Mammogram 0-4 Bi-Rad 0-4 Bi-Rad, Self Reported Normal       Assessment & Plan:   Problem List Items Addressed This Visit       Cardiovascular and Mediastinum   Essential hypertension - Primary   Relevant Medications   rosuvastatin (CRESTOR) 40 MG tablet   Other Relevant Orders   COMPLETE METABOLIC PANEL WITH GFR   CBC with Differential/Platelet     Digestive   GERD (gastroesophageal reflux disease)     Endocrine   Subclinical hypothyroidism   Relevant Orders   TSH     Musculoskeletal and Integument   Seropositive rheumatoid arthritis (HCC)   Relevant Medications   methotrexate (RHEUMATREX) 2.5 MG tablet   predniSONE  (STERAPRED UNI-PAK 21 TAB) 10 MG (21) TBPK tablet     Genitourinary   Chronic kidney disease, stage III (moderate) (HCC)   Relevant Orders   COMPLETE METABOLIC PANEL WITH GFR     Other   Hyperlipidemia   Relevant Medications   rosuvastatin (CRESTOR) 40 MG tablet   Other Relevant Orders   COMPLETE METABOLIC PANEL WITH GFR   Lipid panel   Obesity, morbid (HCC)    Comorbidities include HTN. HLD, GERD, Depression, anxiety, HLD      GAD (generalized anxiety disorder)   Relevant Medications   hydrOXYzine (VISTARIL) 25 MG capsule   Immunocompromised state due to drug therapy (HCC)   Mild episode of recurrent major depressive disorder (HCC)   Relevant Medications   hydrOXYzine (VISTARIL) 25 MG capsule   Other Visit Diagnoses     Overactive bladder       Relevant Medications   oxybutynin (DITROPAN) 5 MG tablet   Persistent cough       Relevant Medications   predniSONE (STERAPRED UNI-PAK 21 TAB) 10 MG (21) TBPK tablet   benzonatate (TESSALON) 100 MG capsule   albuterol (VENTOLIN HFA) 108 (90 Base) MCG/ACT inhaler   Other Relevant Orders   DG Chest 2 View   CBC with Differential/Platelet        Assessment and Plan    Hypertension Well controlled on Metoprolol 100mg  daily and Olmesartan-Hydrochlorothiazide 10-12.5mg  daily. -Continue current medications.  Chronic Kidney Disease, Stage 3 Last GFR was 58 on 02/24/2023. -Monitor renal function.  Hyperlipidemia Tolerating Rosuvastatin 40mg  daily well. -Continue Rosuvastatin 40mg  daily.  Gastroesophageal Reflux Disease Well controlled on Omeprazole 20mg  daily. -Continue Omeprazole 20mg  daily.  Obesity Current weight is 241 pounds with a BMI of 44.1. -Recommend lifestyle modification.  Anxiety and Depression Increased anxiety due to husband's illness. Previously tried Lexapro 10mg  daily but discontinued due to headaches. -Prescribe Hydroxyzine for as-needed use.  Subclinical Hypothyroidism -Recheck TSH  today.  Rheumatoid Arthritis Stable on Methotrexate 20mg  weekly. -Continue Methotrexate 20mg  weekly.  Overactive Bladder Patient reports longstanding symptoms and desires medication. -Prescribe Oxybutane 0.5mg  twice daily.  Persistent Cough Recurrent cough since October, recently returned after a course of Azithromycin and Prednisone. -Prescribe Albuterol inhaler, Tessalon Perles, and Prednisone. -Optional chest X-ray if cough worsens.        Follow up plan: Return in about 6 months (around 02/27/2024) for follow up.

## 2023-08-29 NOTE — Telephone Encounter (Signed)
Patient returned my call. Information given per [facility]. Patient or her daughter will stop by to pick up paperwork. (Up front in file cabinet)

## 2023-08-29 NOTE — Assessment & Plan Note (Signed)
Comorbidities include HTN. HLD, GERD, Depression, anxiety, HLD

## 2023-08-29 NOTE — Telephone Encounter (Signed)
Patient came in this morning for office visit and brought in a lab bill she received from [facility] after having labs a few months ago. She was charged by them due to insurance denial for a diabetes screening test (A1c), I spoke with a [facility] representative and verified correct diagnosis codes were on file and he stated they were correct and he did verify the test should have been covered. He advised to have the patient contact the Medicare insurer and have them revisit and revise the claim and send them a new EOB so that a refund can be processed.   Left voicemail for patient to return call to explain findings.

## 2023-08-30 LAB — COMPLETE METABOLIC PANEL WITH GFR
AG Ratio: 1.6 (calc) (ref 1.0–2.5)
ALT: 9 U/L (ref 6–29)
AST: 14 U/L (ref 10–35)
Albumin: 4.1 g/dL (ref 3.6–5.1)
Alkaline phosphatase (APISO): 102 U/L (ref 37–153)
BUN: 16 mg/dL (ref 7–25)
CO2: 31 mmol/L (ref 20–32)
Calcium: 9.5 mg/dL (ref 8.6–10.4)
Chloride: 100 mmol/L (ref 98–110)
Creat: 0.88 mg/dL (ref 0.60–1.00)
Globulin: 2.5 g/dL (ref 1.9–3.7)
Glucose, Bld: 94 mg/dL (ref 65–99)
Potassium: 4 mmol/L (ref 3.5–5.3)
Sodium: 139 mmol/L (ref 135–146)
Total Bilirubin: 0.7 mg/dL (ref 0.2–1.2)
Total Protein: 6.6 g/dL (ref 6.1–8.1)
eGFR: 68 mL/min/{1.73_m2} (ref >=60)

## 2023-08-30 LAB — CBC WITH DIFFERENTIAL/PLATELET
Absolute Lymphocytes: 1267 {cells}/uL (ref 850–3900)
Absolute Monocytes: 449 {cells}/uL (ref 200–950)
Basophils Absolute: 26 {cells}/uL (ref 0–200)
Basophils Relative: 0.3 %
Eosinophils Absolute: 150 {cells}/uL (ref 15–500)
Eosinophils Relative: 1.7 %
HCT: 39.7 % (ref 35.0–45.0)
Hemoglobin: 13.2 g/dL (ref 11.7–15.5)
MCH: 30.8 pg (ref 27.0–33.0)
MCHC: 33.2 g/dL (ref 32.0–36.0)
MCV: 92.8 fL (ref 80.0–100.0)
MPV: 9.9 fL (ref 7.5–12.5)
Monocytes Relative: 5.1 %
Neutro Abs: 6908 {cells}/uL (ref 1500–7800)
Neutrophils Relative %: 78.5 %
Platelets: 245 10*3/uL (ref 140–400)
RBC: 4.28 10*6/uL (ref 3.80–5.10)
RDW: 14.2 % (ref 11.0–15.0)
Total Lymphocyte: 14.4 %
WBC: 8.8 10*3/uL (ref 3.8–10.8)

## 2023-08-30 LAB — LIPID PANEL
Cholesterol: 146 mg/dL (ref <200)
HDL: 42 mg/dL — ABNORMAL LOW (ref >=50)
LDL Cholesterol (Calc): 74 mg/dL
Non-HDL Cholesterol (Calc): 104 mg/dL (ref <130)
Total CHOL/HDL Ratio: 3.5 (calc) (ref <5.0)
Triglycerides: 201 mg/dL — ABNORMAL HIGH (ref <150)

## 2023-08-30 LAB — TSH: TSH: 4.93 m[IU]/L — ABNORMAL HIGH (ref 0.40–4.50)

## 2023-08-31 DIAGNOSIS — H43813 Vitreous degeneration, bilateral: Secondary | ICD-10-CM | POA: Diagnosis not present

## 2023-08-31 DIAGNOSIS — H2513 Age-related nuclear cataract, bilateral: Secondary | ICD-10-CM | POA: Diagnosis not present

## 2023-08-31 DIAGNOSIS — H353231 Exudative age-related macular degeneration, bilateral, with active choroidal neovascularization: Secondary | ICD-10-CM | POA: Diagnosis not present

## 2023-08-31 DIAGNOSIS — H353132 Nonexudative age-related macular degeneration, bilateral, intermediate dry stage: Secondary | ICD-10-CM | POA: Diagnosis not present

## 2023-09-06 NOTE — Telephone Encounter (Signed)
Attempted to reach patient, husband stated she was unavailable but he would have her return call.    Copied from CRM [id number]. Topic: General - Billing Inquiry >> Sep 06, 2023 12:15 PM Phill Myron wrote: Attn: Hayley Simmons, CMA please call pt Hayley Simmons in regards to a billing and coding issue she said you were assisting her with she has received more information. Please advise

## 2023-09-26 ENCOUNTER — Other Ambulatory Visit: Payer: Self-pay | Admitting: Nurse Practitioner

## 2023-09-26 DIAGNOSIS — R053 Chronic cough: Secondary | ICD-10-CM

## 2023-09-27 ENCOUNTER — Other Ambulatory Visit: Payer: Self-pay | Admitting: Nurse Practitioner

## 2023-09-27 DIAGNOSIS — F330 Major depressive disorder, recurrent, mild: Secondary | ICD-10-CM

## 2023-09-27 DIAGNOSIS — F411 Generalized anxiety disorder: Secondary | ICD-10-CM

## 2023-09-28 NOTE — Telephone Encounter (Signed)
 Requested Prescriptions  Pending Prescriptions Disp Refills   albuterol  (VENTOLIN  HFA) 108 (90 Base) MCG/ACT inhaler [Pharmacy Med Name: ALBUTEROL  HFA INH (200 PUFFS) 6.7GM] 6.7 g 0    Sig: INHALE 2 PUFFS INTO THE LUNGS EVERY 6 HOURS AS NEEDED FOR WHEEZING OR SHORTNESS OF BREATH     Pulmonology:  Beta Agonists 2 Passed - 09/28/2023  4:27 PM      Passed - Last BP in normal range    BP Readings from Last 1 Encounters:  08/29/23 126/82         Passed - Last Heart Rate in normal range    Pulse Readings from Last 1 Encounters:  08/29/23 60         Passed - Valid encounter within last 12 months    Recent Outpatient Visits           1 month ago Essential hypertension   [facility] Gareth Mliss FALCON, FNP   7 months ago Essential hypertension   [facility] Gareth Mliss FALCON, FNP   1 year ago Essential hypertension   [facility] Gareth Mliss FALCON, FNP   1 year ago Essential hypertension   [facility] Gareth Mliss FALCON, FNP   2 years ago Essential hypertension   [facility] Gareth Mliss FALCON, FNP       Future Appointments             In 5 months Gareth, Mliss FALCON, FNP [facility], [facility]

## 2023-10-02 NOTE — Telephone Encounter (Signed)
 Requested Prescriptions  Pending Prescriptions Disp Refills   hydrOXYzine  (VISTARIL ) 25 MG capsule [Pharmacy Med Name: HYDROXYZINE  PAMOATE 25MG  CAPSULES] 90 capsule 0    Sig: TAKE 1 CAPSULE(25 MG) BY MOUTH EVERY 8 HOURS AS NEEDED FOR ANXIETY     Ear, Nose, and Throat:  Antihistamines 2 Passed - 10/02/2023  7:32 AM      Passed - Cr in normal range and within 360 days    Creat  Date Value Ref Range Status  08/29/2023 0.88 0.60 - 1.00 mg/dL Final   Creatinine, Urine  Date Value Ref Range Status  04/11/2017 186 20 - 320 mg/dL Final         Passed - Valid encounter within last 12 months    Recent Outpatient Visits           1 month ago Essential hypertension   [facility] Gareth Mliss FALCON, FNP   7 months ago Essential hypertension   [facility] Gareth Mliss FALCON, FNP   1 year ago Essential hypertension   [facility] Gareth Mliss FALCON, FNP   1 year ago Essential hypertension   [facility] Gareth Mliss FALCON, FNP   2 years ago Essential hypertension   [facility] Gareth Mliss FALCON, FNP       Future Appointments             In 4 months Gareth, Mliss FALCON, FNP [facility], [facility]

## 2023-10-13 DIAGNOSIS — H43813 Vitreous degeneration, bilateral: Secondary | ICD-10-CM | POA: Diagnosis not present

## 2023-10-13 DIAGNOSIS — H353132 Nonexudative age-related macular degeneration, bilateral, intermediate dry stage: Secondary | ICD-10-CM | POA: Diagnosis not present

## 2023-10-13 DIAGNOSIS — H353211 Exudative age-related macular degeneration, right eye, with active choroidal neovascularization: Secondary | ICD-10-CM | POA: Diagnosis not present

## 2023-10-13 DIAGNOSIS — H2513 Age-related nuclear cataract, bilateral: Secondary | ICD-10-CM | POA: Diagnosis not present

## 2023-11-20 DIAGNOSIS — M0579 Rheumatoid arthritis with rheumatoid factor of multiple sites without organ or systems involvement: Secondary | ICD-10-CM | POA: Diagnosis not present

## 2023-11-20 DIAGNOSIS — Z7960 Long term (current) use of unspecified immunomodulators and immunosuppressants: Secondary | ICD-10-CM | POA: Diagnosis not present

## 2023-11-24 DIAGNOSIS — H43813 Vitreous degeneration, bilateral: Secondary | ICD-10-CM | POA: Diagnosis not present

## 2023-11-24 DIAGNOSIS — H353231 Exudative age-related macular degeneration, bilateral, with active choroidal neovascularization: Secondary | ICD-10-CM | POA: Diagnosis not present

## 2023-11-24 DIAGNOSIS — H353132 Nonexudative age-related macular degeneration, bilateral, intermediate dry stage: Secondary | ICD-10-CM | POA: Diagnosis not present

## 2023-11-24 DIAGNOSIS — H2513 Age-related nuclear cataract, bilateral: Secondary | ICD-10-CM | POA: Diagnosis not present

## 2023-11-27 ENCOUNTER — Ambulatory Visit
Admission: RE | Admit: 2023-11-27 | Discharge: 2023-11-27 | Disposition: A | Discharge status: Home / Self Care | Attending: Nurse Practitioner | Admitting: Nurse Practitioner

## 2023-11-27 ENCOUNTER — Encounter: Payer: Self-pay | Admitting: Nurse Practitioner

## 2023-11-27 ENCOUNTER — Ambulatory Visit
Admission: RE | Admit: 2023-11-27 | Discharge: 2023-11-27 | Disposition: A | Discharge status: Home / Self Care | Source: Ambulatory Visit | Attending: Nurse Practitioner

## 2023-11-27 DIAGNOSIS — R918 Other nonspecific abnormal finding of lung field: Secondary | ICD-10-CM | POA: Diagnosis not present

## 2023-11-27 DIAGNOSIS — R053 Chronic cough: Secondary | ICD-10-CM | POA: Diagnosis not present

## 2023-11-27 DIAGNOSIS — I517 Cardiomegaly: Secondary | ICD-10-CM | POA: Diagnosis not present

## 2023-11-29 ENCOUNTER — Other Ambulatory Visit: Payer: Self-pay | Admitting: Nurse Practitioner

## 2023-11-29 ENCOUNTER — Encounter: Payer: Self-pay | Admitting: Nurse Practitioner

## 2023-11-29 DIAGNOSIS — R053 Chronic cough: Secondary | ICD-10-CM

## 2023-11-29 MED ORDER — FLUTICASONE-SALMETEROL 100-50 MCG/ACT IN AEPB: 1.0000 | INHALATION_SPRAY | Freq: Two times a day (BID) | RESPIRATORY_TRACT | 0 refills | Status: DC

## 2023-11-29 MED ORDER — PREDNISONE 10 MG (21) PO TBPK: ORAL_TABLET | ORAL | 0 refills | Status: DC

## 2023-11-30 NOTE — Telephone Encounter (Signed)
 Requested medications are due for refill today.  no  Requested medications are on the active medications list.  yes  Last refill. 11/29/2023 #21 0 rf  Future visit scheduled.   yes  Notes to clinic.  Refill/refusal not delegated.    Requested Prescriptions  Pending Prescriptions Disp Refills   predniSONE (STERAPRED UNI-PAK 21 TAB) 10 MG (21) TBPK tablet [Pharmacy Med Name: PREDNISONE 10MG  TABS PACK 21S] 21 each     Sig: FOLLOW PACKAGE DIRECTIONS     Not Delegated - Endocrinology:  Oral Corticosteroids Failed - 11/30/2023 11:24 AM      Failed - This refill cannot be delegated      Failed - Manual Review: Eye exam for IOP if prolonged treatment      Failed - Bone Mineral Density or Dexa Scan completed in the last 2 years      Passed - Glucose (serum) in normal range and within 180 days    Glucose, Bld  Date Value Ref Range Status  08/29/2023 94 65 - 99 mg/dL Final    Comment:    .            Fasting reference interval .    POC Glucose  Date Value Ref Range Status  04/11/2017 107 (A) 70 - 99 mg/dl Final   Glucose Fasting, POC  Date Value Ref Range Status  07/13/2017 105 (A) 70 - 99 mg/dL Final         Passed - K in normal range and within 180 days    Potassium  Date Value Ref Range Status  08/29/2023 4.0 3.5 - 5.3 mmol/L Final         Passed - Na in normal range and within 180 days    Sodium  Date Value Ref Range Status  08/29/2023 139 135 - 146 mmol/L Final  12/21/2015 146 (H) 134 - 144 mmol/L Final         Passed - Last BP in normal range    BP Readings from Last 1 Encounters:  08/29/23 126/82         Passed - Valid encounter within last 6 months    Recent Outpatient Visits           3 months ago Essential hypertension   [facility] Berniece Salines, FNP   9 months ago Essential hypertension   [facility] Berniece Salines, FNP   1 year ago Essential hypertension   [facility] Berniece Salines, FNP   1 year ago Essential hypertension   [facility] Berniece Salines, FNP   2 years ago Essential hypertension   [facility] Berniece Salines, FNP       Future Appointments             In 2 months Zane Herald, Rudolpho Sevin, FNP [facility], [facility]

## 2023-12-12 ENCOUNTER — Encounter: Payer: Self-pay | Admitting: Nurse Practitioner

## 2023-12-12 ENCOUNTER — Ambulatory Visit (INDEPENDENT_AMBULATORY_CARE_PROVIDER_SITE_OTHER): Admitting: Nurse Practitioner

## 2023-12-12 ENCOUNTER — Telehealth: Payer: Self-pay

## 2023-12-12 DIAGNOSIS — R053 Chronic cough: Secondary | ICD-10-CM | POA: Diagnosis not present

## 2023-12-12 MED ORDER — BENZONATATE 100 MG PO CAPS: 200.0000 mg | ORAL_CAPSULE | Freq: Two times a day (BID) | ORAL | 0 refills | Status: AC | PRN

## 2023-12-12 MED ORDER — AZITHROMYCIN 250 MG PO TABS: ORAL_TABLET | ORAL | 0 refills | Status: AC

## 2023-12-12 MED ORDER — ALBUTEROL SULFATE HFA 108 (90 BASE) MCG/ACT IN AERS: 2.0000 | INHALATION_SPRAY | Freq: Four times a day (QID) | RESPIRATORY_TRACT | 0 refills | Status: AC | PRN

## 2023-12-12 NOTE — Progress Notes (Signed)
 BP 132/84   Pulse 73   Temp 97.7 F (36.5 C)   Resp 18   Ht 5\' 2"  (1.575 m)   Wt 240 lb 6.4 oz (109 kg)   SpO2 93%   BMI 43.97 kg/m    Subjective:    Patient ID: Hayley Simmons, female    DOB: 04-27-1946, 78 y.o.   MRN: [id number]  HPI: Hayley Simmons is a 78 y.o. female  Chief Complaint  Patient presents with   Cough    On going for 6 months    Discussed the use of AI scribe software for clinical note transcription with the patient, who gave verbal consent to proceed.  History of Present Illness Hayley Simmons is a 78 year old female who presents with a persistent cough.   She has been experiencing a persistent cough for several months. Initially, she was treated with a steroid taper, Tessalon Perles, and an albuterol inhaler, which provided temporary relief. However, the cough returned, prompting further evaluation. A chest x-ray on November 27, 2023, showed mild bronchial thickening, leading to a prescription for Advair and a referral to pulmonology. The cough is severe enough to disrupt her sleep.  She reports that the Advair inhaler exacerbates her cough, causing her to discontinue its use. Prednisone does not seem to alleviate her current symptoms. She is currently using an albuterol inhaler, which is nearly empty, and prefers [employer], which she found effective in the past.  In addition to the cough, she has developed rhinorrhea, which began a couple of weeks ago when she started the new inhaler. No shortness of breath or fever is present.         12/12/2023    8:08 AM 08/29/2023    7:56 AM 02/24/2023    8:18 AM  Depression screen PHQ 2/9  Decreased Interest 0 1 0  Down, Depressed, Hopeless 0 1 1  PHQ - 2 Score 0 2 1  Altered sleeping 0 1 3  Tired, decreased energy 0 1 0  Change in appetite 0 0 0  Feeling bad or failure about yourself  0 0 0  Trouble concentrating 0 0 3  Moving slowly or fidgety/restless 0 0 0  Suicidal thoughts 0 0 0  PHQ-9 Score 0  4 7  Difficult doing work/chores Not difficult at all Not difficult at all Somewhat difficult    Relevant past medical, surgical, family and social history reviewed and updated as indicated. Interim medical history since our last visit reviewed. Allergies and medications reviewed and updated.  Review of Systems  Ten systems reviewed and is negative except as mentioned in HPI      Objective:    BP 132/84   Pulse 73   Temp 97.7 F (36.5 C)   Resp 18   Ht 5\' 2"  (1.575 m)   Wt 240 lb 6.4 oz (109 kg)   SpO2 93%   BMI 43.97 kg/m    Wt Readings from Last 3 Encounters:  12/12/23 240 lb 6.4 oz (109 kg)  08/29/23 241 lb 1.6 oz (109.4 kg)  02/24/23 245 lb 11.2 oz (111.4 kg)    Physical Exam Physical Exam GENERAL: Alert, cooperative, well developed, no acute distress HEENT: Normocephalic, normal oropharynx, moist mucous membranes CHEST: Clear to auscultation bilaterally, no wheezes, rhonchi, or crackles CARDIOVASCULAR: Normal heart rate and rhythm, S1 and S2 normal without murmurs ABDOMEN: Soft, non-tender, non-distended, without organomegaly, normal bowel sounds EXTREMITIES: No cyanosis or edema NEUROLOGICAL: Cranial nerves grossly  intact, moves all extremities without gross motor or sensory deficit   Results for orders placed or performed in visit on 08/29/23  COMPLETE METABOLIC PANEL WITH GFR   Collection Time: 08/29/23  8:40 AM  Result Value Ref Range   Glucose, Bld 94 65 - 99 mg/dL   BUN 16 7 - 25 mg/dL   Creat 1.19 1.47 - 8.29 mg/dL   eGFR 68 > OR = 60 FA/OZH/0.86V7   BUN/Creatinine Ratio SEE NOTE: 6 - 22 (calc)   Sodium 139 135 - 146 mmol/L   Potassium 4.0 3.5 - 5.3 mmol/L   Chloride 100 98 - 110 mmol/L   CO2 31 20 - 32 mmol/L   Calcium 9.5 8.6 - 10.4 mg/dL   Total Protein 6.6 6.1 - 8.1 g/dL   Albumin 4.1 3.6 - 5.1 g/dL   Globulin 2.5 1.9 - 3.7 g/dL (calc)   AG Ratio 1.6 1.0 - 2.5 (calc)   Total Bilirubin 0.7 0.2 - 1.2 mg/dL   Alkaline phosphatase (APISO) 102 37 -  153 U/L   AST 14 10 - 35 U/L   ALT 9 6 - 29 U/L  Lipid panel   Collection Time: 08/29/23  8:40 AM  Result Value Ref Range   Cholesterol 146 <200 mg/dL   HDL 42 (L) > OR = 50 mg/dL   Triglycerides 846 (H) <150 mg/dL   LDL Cholesterol (Calc) 74 mg/dL (calc)   Total CHOL/HDL Ratio 3.5 <5.0 (calc)   Non-HDL Cholesterol (Calc) 104 <130 mg/dL (calc)  TSH   Collection Time: 08/29/23  8:40 AM  Result Value Ref Range   TSH 4.93 (H) 0.40 - 4.50 mIU/L  CBC with Differential/Platelet   Collection Time: 08/29/23  8:40 AM  Result Value Ref Range   WBC 8.8 3.8 - 10.8 Thousand/uL   RBC 4.28 3.80 - 5.10 Million/uL   Hemoglobin 13.2 11.7 - 15.5 g/dL   HCT 96.2 95.2 - 84.1 %   MCV 92.8 80.0 - 100.0 fL   MCH 30.8 27.0 - 33.0 pg   MCHC 33.2 32.0 - 36.0 g/dL   RDW 32.4 40.1 - 02.7 %   Platelets 245 140 - 400 Thousand/uL   MPV 9.9 7.5 - 12.5 fL   Neutro Abs 6,908 1,500 - 7,800 cells/uL   Absolute Lymphocytes 1,267 850 - 3,900 cells/uL   Absolute Monocytes 449 200 - 950 cells/uL   Eosinophils Absolute 150 15 - 500 cells/uL   Basophils Absolute 26 0 - 200 cells/uL   Neutrophils Relative % 78.5 %   Total Lymphocyte 14.4 %   Monocytes Relative 5.1 %   Eosinophils Relative 1.7 %   Basophils Relative 0.3 %       Assessment & Plan:   Problem List Items Addressed This Visit   None Visit Diagnoses       Persistent cough       Relevant Medications   albuterol (VENTOLIN HFA) 108 (90 Base) MCG/ACT inhaler   benzonatate (TESSALON) 100 MG capsule   azithromycin (ZITHROMAX) 250 MG tablet        Assessment and Plan Assessment & Plan Chronic Cough Persistent cough for over six months, possibly related to pulmonary issues. Previous treatments included a steroid taper, Tessalon Perles, and albuterol inhaler. Recent chest x-ray showed mild bronchial thickening. Advair inhaler was not tolerated due to increased coughing. Differential diagnosis includes chronic bronchitis, asthma, or long-term effects  of COVID-19. Pulmonology referral for further evaluation with pulmonary function test is pending. Cough worsened with Advair inhaler  and improved with Tessalon Perles and albuterol inhaler. Considering the chronic nature of the cough and potential for an underlying infection, an antibiotic trial is warranted. - Prescribe antibiotic to address potential underlying infection. azithromycin - Prescribe Tessalon Perles for cough suppression. - Prescribe albuterol inhaler for symptomatic relief. - Encourage keeping the pulmonology appointment for further evaluation, including pulmonary function test.        Follow up plan: Return for AWV w/ nurse.

## 2023-12-12 NOTE — Telephone Encounter (Signed)
 Pt needs an appt for AWV

## 2023-12-12 NOTE — Telephone Encounter (Signed)
 I spoke with patient to schedule Annual Wellness Visit. Patient declined Annual Wellness Visit and doesn't want me to call back to schedule.

## 2023-12-18 ENCOUNTER — Ambulatory Visit: Admitting: Pulmonary Disease

## 2023-12-28 ENCOUNTER — Other Ambulatory Visit: Payer: Self-pay | Admitting: Nurse Practitioner

## 2023-12-28 DIAGNOSIS — F411 Generalized anxiety disorder: Secondary | ICD-10-CM

## 2023-12-28 DIAGNOSIS — F330 Major depressive disorder, recurrent, mild: Secondary | ICD-10-CM

## 2023-12-29 NOTE — Telephone Encounter (Signed)
 Requested Prescriptions  Pending Prescriptions Disp Refills   hydrOXYzine (VISTARIL) 25 MG capsule [Pharmacy Med Name: HYDROXYZINE PAMOATE 25MG  CAPSULES] 90 capsule 0    Sig: TAKE 1 CAPSULE(25 MG) BY MOUTH EVERY 8 HOURS AS NEEDED FOR ANXIETY     Ear, Nose, and Throat:  Antihistamines 2 Failed - 12/29/2023 10:31 AM      Failed - Valid encounter within last 12 months    Recent Outpatient Visits           2 weeks ago Persistent cough   [facility] Berniece Salines, FNP       Future Appointments             In 2 months Berniece Salines, FNP [facility]            Passed - Cr in normal range and within 360 days    Creat  Date Value Ref Range Status  08/29/2023 0.88 0.60 - 1.00 mg/dL Final   Creatinine, Urine  Date Value Ref Range Status  04/11/2017 186 20 - 320 mg/dL Final

## 2024-01-19 DIAGNOSIS — H353231 Exudative age-related macular degeneration, bilateral, with active choroidal neovascularization: Secondary | ICD-10-CM | POA: Diagnosis not present

## 2024-01-19 DIAGNOSIS — H2513 Age-related nuclear cataract, bilateral: Secondary | ICD-10-CM | POA: Diagnosis not present

## 2024-01-19 DIAGNOSIS — H43813 Vitreous degeneration, bilateral: Secondary | ICD-10-CM | POA: Diagnosis not present

## 2024-01-21 ENCOUNTER — Other Ambulatory Visit: Payer: Self-pay | Admitting: Nurse Practitioner

## 2024-01-21 DIAGNOSIS — N3281 Overactive bladder: Secondary | ICD-10-CM

## 2024-01-23 NOTE — Telephone Encounter (Signed)
 Requested Prescriptions  Pending Prescriptions Disp Refills   oxybutynin  (DITROPAN ) 5 MG tablet [Pharmacy Med Name: OXYBUTYNIN  5MG  TABLETS] 90 tablet 0    Sig: TAKE 1/2 TABLET(2.5 MG) BY MOUTH TWICE DAILY     Urology:  Bladder Agents Failed - 01/23/2024  3:08 PM      Failed - Valid encounter within last 12 months    Recent Outpatient Visits           1 month ago Persistent cough   [facility] Quinton Buckler, FNP       Future Appointments             In 1 month Abram Hoguet, Monalisa Angles, FNP [facility], [facility]

## 2024-02-26 NOTE — Progress Notes (Unsigned)
 There were no vitals taken for this visit.   Subjective:    Patient ID: Hayley Simmons, female    DOB: Jun 28, 1946, 78 y.o.   MRN: [id number]  HPI: Hayley Simmons is a 78 y.o. female  No chief complaint on file.   Discussed the use of AI scribe software for clinical note transcription with the patient, who gave verbal consent to proceed.  History of Present Illness          12/12/2023    8:08 AM 08/29/2023    7:56 AM 02/24/2023    8:18 AM  Depression screen PHQ 2/9  Decreased Interest 0 1 0  Down, Depressed, Hopeless 0 1 1  PHQ - 2 Score 0 2 1  Altered sleeping 0 1 3  Tired, decreased energy 0 1 0  Change in appetite 0 0 0  Feeling bad or failure about yourself  0 0 0  Trouble concentrating 0 0 3  Moving slowly or fidgety/restless 0 0 0  Suicidal thoughts 0 0 0  PHQ-9 Score 0 4 7  Difficult doing work/chores Not difficult at all Not difficult at all Somewhat difficult    Relevant past medical, surgical, family and social history reviewed and updated as indicated. Interim medical history since our last visit reviewed. Allergies and medications reviewed and updated.  Review of Systems  Per HPI unless specifically indicated above     Objective:      There were no vitals taken for this visit.  {Vitals History (Optional):23777} Wt Readings from Last 3 Encounters:  12/12/23 240 lb 6.4 oz (109 kg)  08/29/23 241 lb 1.6 oz (109.4 kg)  02/24/23 245 lb 11.2 oz (111.4 kg)    Physical Exam Physical Exam    Results for orders placed or performed in visit on 08/29/23  COMPLETE METABOLIC PANEL WITH GFR   Collection Time: 08/29/23  8:40 AM  Result Value Ref Range   Glucose, Bld 94 65 - 99 mg/dL   BUN 16 7 - 25 mg/dL   Creat 1.47 8.29 - 5.62 mg/dL   eGFR 68 > OR = 60 ZH/YQM/5.78I6   BUN/Creatinine Ratio SEE NOTE: 6 - 22 (calc)   Sodium 139 135 - 146 mmol/L   Potassium 4.0 3.5 - 5.3 mmol/L   Chloride 100 98 - 110 mmol/L   CO2 31 20 - 32 mmol/L   Calcium  9.5 8.6  - 10.4 mg/dL   Total Protein 6.6 6.1 - 8.1 g/dL   Albumin 4.1 3.6 - 5.1 g/dL   Globulin 2.5 1.9 - 3.7 g/dL (calc)   AG Ratio 1.6 1.0 - 2.5 (calc)   Total Bilirubin 0.7 0.2 - 1.2 mg/dL   Alkaline phosphatase (APISO) 102 37 - 153 U/L   AST 14 10 - 35 U/L   ALT 9 6 - 29 U/L  Lipid panel   Collection Time: 08/29/23  8:40 AM  Result Value Ref Range   Cholesterol 146 <200 mg/dL   HDL 42 (L) > OR = 50 mg/dL   Triglycerides 962 (H) <150 mg/dL   LDL Cholesterol (Calc) 74 mg/dL (calc)   Total CHOL/HDL Ratio 3.5 <5.0 (calc)   Non-HDL Cholesterol (Calc) 104 <130 mg/dL (calc)  TSH   Collection Time: 08/29/23  8:40 AM  Result Value Ref Range   TSH 4.93 (H) 0.40 - 4.50 mIU/L  CBC with Differential/Platelet   Collection Time: 08/29/23  8:40 AM  Result Value Ref Range   WBC 8.8 3.8 - 10.8 Thousand/uL  RBC 4.28 3.80 - 5.10 Million/uL   Hemoglobin 13.2 11.7 - 15.5 g/dL   HCT 14.7 82.9 - 56.2 %   MCV 92.8 80.0 - 100.0 fL   MCH 30.8 27.0 - 33.0 pg   MCHC 33.2 32.0 - 36.0 g/dL   RDW 13.0 86.5 - 78.4 %   Platelets 245 140 - 400 Thousand/uL   MPV 9.9 7.5 - 12.5 fL   Neutro Abs 6,908 1,500 - 7,800 cells/uL   Absolute Lymphocytes 1,267 850 - 3,900 cells/uL   Absolute Monocytes 449 200 - 950 cells/uL   Eosinophils Absolute 150 15 - 500 cells/uL   Basophils Absolute 26 0 - 200 cells/uL   Neutrophils Relative % 78.5 %   Total Lymphocyte 14.4 %   Monocytes Relative 5.1 %   Eosinophils Relative 1.7 %   Basophils Relative 0.3 %   {Labs (Optional):23779}       Assessment & Plan:   Problem List Items Addressed This Visit   None    Assessment and Plan Assessment & Plan         Follow up plan: No follow-ups on file.

## 2024-02-27 ENCOUNTER — Ambulatory Visit: Payer: Self-pay | Admitting: Nurse Practitioner

## 2024-02-27 ENCOUNTER — Encounter: Payer: Self-pay | Admitting: Nurse Practitioner

## 2024-02-27 VITALS — BP 144/80 | HR 77 | Temp 97.7°F | Ht 62.0 in | Wt 240.2 lb

## 2024-02-27 DIAGNOSIS — R7303 Prediabetes: Secondary | ICD-10-CM | POA: Diagnosis not present

## 2024-02-27 DIAGNOSIS — K219 Gastro-esophageal reflux disease without esophagitis: Secondary | ICD-10-CM | POA: Diagnosis not present

## 2024-02-27 DIAGNOSIS — E038 Other specified hypothyroidism: Secondary | ICD-10-CM

## 2024-02-27 DIAGNOSIS — M170 Bilateral primary osteoarthritis of knee: Secondary | ICD-10-CM

## 2024-02-27 DIAGNOSIS — E782 Mixed hyperlipidemia: Secondary | ICD-10-CM | POA: Diagnosis not present

## 2024-02-27 DIAGNOSIS — F411 Generalized anxiety disorder: Secondary | ICD-10-CM | POA: Diagnosis not present

## 2024-02-27 DIAGNOSIS — F330 Major depressive disorder, recurrent, mild: Secondary | ICD-10-CM

## 2024-02-27 DIAGNOSIS — D84821 Immunodeficiency due to drugs: Secondary | ICD-10-CM

## 2024-02-27 DIAGNOSIS — M059 Rheumatoid arthritis with rheumatoid factor, unspecified: Secondary | ICD-10-CM

## 2024-02-27 DIAGNOSIS — N183 Chronic kidney disease, stage 3 unspecified: Secondary | ICD-10-CM

## 2024-02-27 DIAGNOSIS — N393 Stress incontinence (female) (male): Secondary | ICD-10-CM

## 2024-02-27 DIAGNOSIS — I10 Essential (primary) hypertension: Secondary | ICD-10-CM

## 2024-02-27 DIAGNOSIS — N3281 Overactive bladder: Secondary | ICD-10-CM

## 2024-02-27 DIAGNOSIS — Z7960 Long term (current) use of unspecified immunomodulators and immunosuppressants: Secondary | ICD-10-CM

## 2024-02-27 MED ORDER — OXYBUTYNIN CHLORIDE 5 MG PO TABS: 5.0000 mg | ORAL_TABLET | Freq: Two times a day (BID) | ORAL | 1 refills | Status: DC

## 2024-02-27 MED ORDER — HYDROXYZINE PAMOATE 25 MG PO CAPS
25.0000 mg | ORAL_CAPSULE | Freq: Three times a day (TID) | ORAL | 1 refills | Status: DC | PRN
Start: 2024-02-27 — End: 2024-05-06

## 2024-02-27 MED ORDER — OXYBUTYNIN CHLORIDE 5 MG PO TABS
10.0000 mg | ORAL_TABLET | Freq: Two times a day (BID) | ORAL | 3 refills | Status: DC
Start: 2024-02-27 — End: 2024-02-27

## 2024-02-28 ENCOUNTER — Ambulatory Visit: Payer: Self-pay | Admitting: Nurse Practitioner

## 2024-02-28 LAB — CBC WITH DIFFERENTIAL/PLATELET
Absolute Lymphocytes: 1454 {cells}/uL (ref 850–3900)
Absolute Monocytes: 474 {cells}/uL (ref 200–950)
Basophils Absolute: 40 {cells}/uL (ref 0–200)
Basophils Relative: 0.5 %
Eosinophils Absolute: 198 {cells}/uL (ref 15–500)
Eosinophils Relative: 2.5 %
HCT: 42.8 % (ref 35.0–45.0)
Hemoglobin: 14.1 g/dL (ref 11.7–15.5)
MCH: 31 pg (ref 27.0–33.0)
MCHC: 32.9 g/dL (ref 32.0–36.0)
MCV: 94.1 fL (ref 80.0–100.0)
MPV: 9.9 fL (ref 7.5–12.5)
Monocytes Relative: 6 %
Neutro Abs: 5735 {cells}/uL (ref 1500–7800)
Neutrophils Relative %: 72.6 %
Platelets: 242 10*3/uL (ref 140–400)
RBC: 4.55 10*6/uL (ref 3.80–5.10)
RDW: 14 % (ref 11.0–15.0)
Total Lymphocyte: 18.4 %
WBC: 7.9 10*3/uL (ref 3.8–10.8)

## 2024-02-28 LAB — COMPREHENSIVE METABOLIC PANEL WITH GFR
AG Ratio: 1.8 (calc) (ref 1.0–2.5)
ALT: 18 U/L (ref 6–29)
AST: 20 U/L (ref 10–35)
Albumin: 4.3 g/dL (ref 3.6–5.1)
Alkaline phosphatase (APISO): 95 U/L (ref 37–153)
BUN: 19 mg/dL (ref 7–25)
CO2: 30 mmol/L (ref 20–32)
Calcium: 9.7 mg/dL (ref 8.6–10.4)
Chloride: 102 mmol/L (ref 98–110)
Creat: 0.93 mg/dL (ref 0.60–1.00)
Globulin: 2.4 g/dL (ref 1.9–3.7)
Glucose, Bld: 109 mg/dL — ABNORMAL HIGH (ref 65–99)
Potassium: 4.3 mmol/L (ref 3.5–5.3)
Sodium: 140 mmol/L (ref 135–146)
Total Bilirubin: 0.7 mg/dL (ref 0.2–1.2)
Total Protein: 6.7 g/dL (ref 6.1–8.1)
eGFR: 64 mL/min/{1.73_m2} (ref >=60)

## 2024-02-28 LAB — THYROID PANEL WITH TSH
Free Thyroxine Index: 1.9 (ref 1.4–3.8)
T3 Uptake: 27 % (ref 22–35)
T4, Total: 7.2 ug/dL (ref 5.1–11.9)
TSH: 5.09 m[IU]/L — ABNORMAL HIGH (ref 0.40–4.50)

## 2024-02-28 LAB — HEMOGLOBIN A1C
Hgb A1c MFr Bld: 6 % — ABNORMAL HIGH (ref <5.7)
Mean Plasma Glucose: 126 mg/dL
eAG (mmol/L): 7 mmol/L

## 2024-02-28 LAB — LIPID PANEL
Cholesterol: 168 mg/dL (ref <200)
HDL: 44 mg/dL — ABNORMAL LOW (ref >=50)
LDL Cholesterol (Calc): 87 mg/dL
Non-HDL Cholesterol (Calc): 124 mg/dL (ref <130)
Total CHOL/HDL Ratio: 3.8 (calc) (ref <5.0)
Triglycerides: 284 mg/dL — ABNORMAL HIGH (ref <150)

## 2024-03-26 ENCOUNTER — Other Ambulatory Visit: Payer: Self-pay | Admitting: Nurse Practitioner

## 2024-03-26 DIAGNOSIS — I10 Essential (primary) hypertension: Secondary | ICD-10-CM

## 2024-03-26 DIAGNOSIS — Z7960 Long term (current) use of unspecified immunomodulators and immunosuppressants: Secondary | ICD-10-CM | POA: Diagnosis not present

## 2024-03-26 DIAGNOSIS — M0579 Rheumatoid arthritis with rheumatoid factor of multiple sites without organ or systems involvement: Secondary | ICD-10-CM | POA: Diagnosis not present

## 2024-03-28 NOTE — Telephone Encounter (Signed)
 Requested Prescriptions  Pending Prescriptions Disp Refills   olmesartan -hydrochlorothiazide  (BENICAR  HCT) 20-12.5 MG tablet [Pharmacy Med Name: OLMESARTAN  MEDOX/HCTZ 20-12.5MG  TAB] 90 tablet 1    Sig: TAKE 1 TABLET BY MOUTH DAILY     Cardiovascular: ARB + Diuretic Combos Failed - 03/28/2024  4:31 PM      Failed - Last BP in normal range    BP Readings from Last 1 Encounters:  02/27/24 (!) 144/80         Passed - K in normal range and within 180 days    Potassium  Date Value Ref Range Status  02/27/2024 4.3 3.5 - 5.3 mmol/L Final         Passed - Na in normal range and within 180 days    Sodium  Date Value Ref Range Status  02/27/2024 140 135 - 146 mmol/L Final  12/21/2015 146 (H) 134 - 144 mmol/L Final         Passed - Cr in normal range and within 180 days    Creat  Date Value Ref Range Status  02/27/2024 0.93 0.60 - 1.00 mg/dL Final   Creatinine, Urine  Date Value Ref Range Status  04/11/2017 186 20 - 320 mg/dL Final         Passed - eGFR is 10 or above and within 180 days    GFR, Est African American  Date Value Ref Range Status  02/07/2020 67 > OR = 60 mL/min/1.61m2 Final   GFR, Est Non African American  Date Value Ref Range Status  02/07/2020 58 (L) > OR = 60 mL/min/1.36m2 Final   eGFR  Date Value Ref Range Status  02/27/2024 64 > OR = 60 mL/min/1.53m2 Final         Passed - Patient is not pregnant      Passed - Valid encounter within last 6 months    Recent Outpatient Visits           1 month ago Essential hypertension   [facility] Gareth Mliss FALCON, FNP   3 months ago Persistent cough   [facility] Gareth Mliss FALCON, FNP       Future Appointments             In 5 months Gareth, Mliss FALCON, FNP [facility], [facility]

## 2024-04-11 DIAGNOSIS — H353231 Exudative age-related macular degeneration, bilateral, with active choroidal neovascularization: Secondary | ICD-10-CM | POA: Diagnosis not present

## 2024-05-03 ENCOUNTER — Other Ambulatory Visit: Payer: Self-pay | Admitting: Nurse Practitioner

## 2024-05-03 DIAGNOSIS — F411 Generalized anxiety disorder: Secondary | ICD-10-CM

## 2024-05-03 DIAGNOSIS — F330 Major depressive disorder, recurrent, mild: Secondary | ICD-10-CM

## 2024-05-06 NOTE — Telephone Encounter (Signed)
 Requested Prescriptions  Pending Prescriptions Disp Refills   hydrOXYzine  (VISTARIL ) 25 MG capsule [Pharmacy Med Name: HYDROXYZINE  PAMOATE 25MG  CAPSULES] 90 capsule 1    Sig: TAKE 1 CAPSULE(25 MG) BY MOUTH EVERY 8 HOURS AS NEEDED FOR ANXIETY     Ear, Nose, and Throat:  Antihistamines 2 Passed - 05/06/2024  4:28 PM      Passed - Cr in normal range and within 360 days    Creat  Date Value Ref Range Status  02/27/2024 0.93 0.60 - 1.00 mg/dL Final   Creatinine, Urine  Date Value Ref Range Status  04/11/2017 186 20 - 320 mg/dL Final         Passed - Valid encounter within last 12 months    Recent Outpatient Visits           2 months ago Essential hypertension   [facility] Gareth Mliss FALCON, FNP   4 months ago Persistent cough   [facility] Gareth Mliss FALCON, FNP       Future Appointments             In 3 months Gareth, Mliss FALCON, FNP [facility], [facility]

## 2024-05-14 ENCOUNTER — Other Ambulatory Visit: Payer: Self-pay | Admitting: Nurse Practitioner

## 2024-05-14 DIAGNOSIS — I10 Essential (primary) hypertension: Secondary | ICD-10-CM

## 2024-05-14 DIAGNOSIS — E782 Mixed hyperlipidemia: Secondary | ICD-10-CM

## 2024-05-16 NOTE — Telephone Encounter (Signed)
 Requested Prescriptions  Pending Prescriptions Disp Refills   metoprolol  tartrate (LOPRESSOR ) 100 MG tablet [Pharmacy Med Name: METOPROLOL  TARTRATE 100MG  TABLETS] 180 tablet 1    Sig: TAKE 1 TABLET(100 MG) BY MOUTH TWICE DAILY     Cardiovascular:  Beta Blockers Failed - 05/16/2024 12:12 PM      Failed - Last BP in normal range    BP Readings from Last 1 Encounters:  02/27/24 (!) 144/80         Passed - Last Heart Rate in normal range    Pulse Readings from Last 1 Encounters:  02/27/24 77         Passed - Valid encounter within last 6 months    Recent Outpatient Visits           2 months ago Essential hypertension   [facility] Hayley Mliss FALCON, FNP   5 months ago Persistent cough   [facility] Hayley Mliss F, FNP       Future Appointments             In 3 months Hayley, Mliss FALCON, FNP [facility] [place]             rosuvastatin  (CRESTOR ) 40 MG tablet [Pharmacy Med Name: ROSUVASTATIN  40MG  TABLETS] 90 tablet 1    Sig: TAKE 1 TABLET(40 MG) BY MOUTH EVERY EVENING     Cardiovascular:  Antilipid - Statins 2 Failed - 05/16/2024 12:12 PM      Failed - Lipid Panel in normal range within the last 12 months    Cholesterol, Total  Date Value Ref Range Status  03/21/2016 169 100 - 199 mg/dL Final   Cholesterol  Date Value Ref Range Status  02/27/2024 168 <200 mg/dL Final   LDL Cholesterol (Calc)  Date Value Ref Range Status  02/27/2024 87 mg/dL (calc) Final    Comment:    Reference range: <100 . Desirable range <100 mg/dL for primary prevention;   <70 mg/dL for patients with CHD or diabetic patients  with > or = 2 CHD risk factors. [facility]RA LDL-C is now calculated using the Martin-Hopkins  calculation, which is a validated novel method providing  better accuracy than the Friedewald equation in the  estimation of LDL-C.  Gladis APPLETHWAITE et al. SANDREA. 7986;689(80): 2061-2068   (http://education.[employer].com/faq/FAQ164)    HDL  Date Value Ref Range Status  02/27/2024 44 (L) > OR = 50 mg/dL Final  92/96/7982 35 (L) >39 mg/dL Final   Triglycerides  Date Value Ref Range Status  02/27/2024 284 (H) <150 mg/dL Final    Comment:    . If a non-fasting specimen was collected, consider repeat triglyceride testing on a fasting specimen if clinically indicated.  Veatrice et al. J. of Clin. Lipidol. 2015;9:129-169. SABRA          Passed - Cr in normal range and within 360 days    Creat  Date Value Ref Range Status  02/27/2024 0.93 0.60 - 1.00 mg/dL Final   Creatinine, Urine  Date Value Ref Range Status  04/11/2017 186 20 - 320 mg/dL Final         Passed - Patient is not pregnant      Passed - Valid encounter within last 12 months    Recent Outpatient Visits           2 months ago Essential hypertension   [facility] Hayley Mliss F, FNP   5 months ago Persistent  cough   [facility] Hayley Mliss FALCON, FNP       Future Appointments             In 3 months Hayley, Mliss FALCON, FNP [facility], [employer]

## 2024-06-03 ENCOUNTER — Other Ambulatory Visit: Payer: Self-pay | Admitting: Nurse Practitioner

## 2024-06-03 DIAGNOSIS — F330 Major depressive disorder, recurrent, mild: Secondary | ICD-10-CM

## 2024-06-03 DIAGNOSIS — F411 Generalized anxiety disorder: Secondary | ICD-10-CM

## 2024-06-04 NOTE — Telephone Encounter (Signed)
 Requested Prescriptions  Pending Prescriptions Disp Refills   hydrOXYzine  (VISTARIL ) 25 MG capsule [Pharmacy Med Name: HYDROXYZINE  PAMOATE 25MG  CAPSULES] 90 capsule 2    Sig: TAKE 1 CAPSULE(25 MG) BY MOUTH EVERY 8 HOURS AS NEEDED FOR ANXIETY     Ear, Nose, and Throat:  Antihistamines 2 Passed - 06/04/2024  2:20 PM      Passed - Cr in normal range and within 360 days    Creat  Date Value Ref Range Status  02/27/2024 0.93 0.60 - 1.00 mg/dL Final   Creatinine, Urine  Date Value Ref Range Status  04/11/2017 186 20 - 320 mg/dL Final         Passed - Valid encounter within last 12 months    Recent Outpatient Visits           3 months ago Essential hypertension   [facility] Gareth Mliss FALCON, FNP   5 months ago Persistent cough   [facility] Gareth Mliss FALCON, FNP       Future Appointments             In 2 months Gareth, Mliss FALCON, FNP [facility], [place]

## 2024-06-14 DIAGNOSIS — H353231 Exudative age-related macular degeneration, bilateral, with active choroidal neovascularization: Secondary | ICD-10-CM | POA: Diagnosis not present

## 2024-06-14 DIAGNOSIS — H43813 Vitreous degeneration, bilateral: Secondary | ICD-10-CM | POA: Diagnosis not present

## 2024-06-14 DIAGNOSIS — H2513 Age-related nuclear cataract, bilateral: Secondary | ICD-10-CM | POA: Diagnosis not present

## 2024-07-09 DIAGNOSIS — Z23 Encounter for immunization: Secondary | ICD-10-CM | POA: Diagnosis not present

## 2024-07-17 DIAGNOSIS — Z1231 Encounter for screening mammogram for malignant neoplasm of breast: Secondary | ICD-10-CM | POA: Diagnosis not present

## 2024-07-17 LAB — HM MAMMOGRAPHY: HM Mammogram: NORMAL (ref 0–4)

## 2024-07-29 DIAGNOSIS — M170 Bilateral primary osteoarthritis of knee: Secondary | ICD-10-CM | POA: Diagnosis not present

## 2024-07-29 DIAGNOSIS — Z7960 Long term (current) use of unspecified immunomodulators and immunosuppressants: Secondary | ICD-10-CM | POA: Diagnosis not present

## 2024-07-29 DIAGNOSIS — M0579 Rheumatoid arthritis with rheumatoid factor of multiple sites without organ or systems involvement: Secondary | ICD-10-CM | POA: Diagnosis not present

## 2024-08-09 DIAGNOSIS — H43813 Vitreous degeneration, bilateral: Secondary | ICD-10-CM | POA: Diagnosis not present

## 2024-08-09 DIAGNOSIS — H2513 Age-related nuclear cataract, bilateral: Secondary | ICD-10-CM | POA: Diagnosis not present

## 2024-08-09 DIAGNOSIS — H353231 Exudative age-related macular degeneration, bilateral, with active choroidal neovascularization: Secondary | ICD-10-CM | POA: Diagnosis not present

## 2024-08-28 ENCOUNTER — Encounter: Payer: Self-pay | Admitting: Nurse Practitioner

## 2024-08-28 ENCOUNTER — Ambulatory Visit: Admitting: Nurse Practitioner

## 2024-08-28 VITALS — BP 139/87 | HR 54 | Temp 98.1°F | Ht 62.0 in | Wt 246.0 lb

## 2024-08-28 DIAGNOSIS — F411 Generalized anxiety disorder: Secondary | ICD-10-CM

## 2024-08-28 DIAGNOSIS — E038 Other specified hypothyroidism: Secondary | ICD-10-CM | POA: Diagnosis not present

## 2024-08-28 DIAGNOSIS — K219 Gastro-esophageal reflux disease without esophagitis: Secondary | ICD-10-CM | POA: Diagnosis not present

## 2024-08-28 DIAGNOSIS — N3281 Overactive bladder: Secondary | ICD-10-CM

## 2024-08-28 DIAGNOSIS — N393 Stress incontinence (female) (male): Secondary | ICD-10-CM | POA: Diagnosis not present

## 2024-08-28 DIAGNOSIS — M059 Rheumatoid arthritis with rheumatoid factor, unspecified: Secondary | ICD-10-CM | POA: Diagnosis not present

## 2024-08-28 DIAGNOSIS — F330 Major depressive disorder, recurrent, mild: Secondary | ICD-10-CM

## 2024-08-28 DIAGNOSIS — I10 Essential (primary) hypertension: Secondary | ICD-10-CM | POA: Diagnosis not present

## 2024-08-28 DIAGNOSIS — R7303 Prediabetes: Secondary | ICD-10-CM

## 2024-08-28 DIAGNOSIS — N183 Chronic kidney disease, stage 3 unspecified: Secondary | ICD-10-CM | POA: Diagnosis not present

## 2024-08-28 DIAGNOSIS — E782 Mixed hyperlipidemia: Secondary | ICD-10-CM

## 2024-08-28 MED ORDER — OXYBUTYNIN CHLORIDE 5 MG PO TABS: 5.0000 mg | ORAL_TABLET | Freq: Two times a day (BID) | ORAL | 1 refills | Status: AC

## 2024-08-28 MED ORDER — OLMESARTAN MEDOXOMIL-HCTZ 20-12.5 MG PO TABS: 1.0000 | ORAL_TABLET | Freq: Every day | ORAL | 1 refills | Status: AC

## 2024-08-28 NOTE — Progress Notes (Signed)
 BP 139/87   Pulse (!) 54   Temp 98.1 F (36.7 C)   Ht 5' 2 (1.575 m)   Wt 246 lb (111.6 kg)   SpO2 93%   BMI 44.99 kg/m    Subjective:    Patient ID: Hayley Simmons, female    DOB: [birth year], 78 y.o.   MRN: [id number]  HPI: Hayley Simmons is a 78 y.o. female  Chief Complaint  Patient presents with   Medical Management of Chronic Issues   Discussed the use of AI scribe software for clinical note transcription with the patient, who gave verbal consent to proceed.  History of Present Illness Hayley Simmons is a 78 year old female who presents for a six month follow-up.  Hypertension - Managed with metoprolol  100 mg daily and olmesartan -hydrochlorothiazide  20-12.5 mg daily - Blood pressure today is 139/87 mmHg  Gastroesophageal reflux symptoms - No symptoms of acid reflux - Taking omeprazole  20 mg daily  Thyroid  function abnormalities - Recent TSH 5.09 with normal T3 and T4 levels - No symptoms of hypothyroidism - Not taking any thyroid  medication  Musculoskeletal pain - Muscular pain in arm, particularly with elevation or after activities such as wrapping gifts - Pain onset after receiving RSV and pneumonia vaccinations - Tylenol  provides symptomatic relief  Rheumatoid arthritis - Currently taking methotrexate 20 mg weekly  Hyperlipidemia - Taking rosuvastatin  40 mg daily  Respiratory and allergic symptoms - Uses albuterol  inhaler and Flonase  as needed - Takes loratadine 10 mg daily  Anxiety symptoms - Hydroxyzine  25 mg as needed for anxiety  Prediabetes - Last hemoglobin A1c was 6.0 - Due for laboratory work   Incontinence -taking oxybutynin  5 mg BID -stable      08/28/2024    8:55 AM 02/27/2024    7:54 AM 12/12/2023    8:08 AM  Depression screen PHQ 2/9  Decreased Interest 0 0 0  Down, Depressed, Hopeless 0 0 0  PHQ - 2 Score 0 0 0  Altered sleeping 0 0 0  Tired, decreased energy 0 0 0  Change in appetite 0 0 0  Feeling bad or failure  about yourself  0 0 0  Trouble concentrating 0 0 0  Moving slowly or fidgety/restless 0 0 0  Suicidal thoughts 0 0 0  PHQ-9 Score 0 0  0   Difficult doing work/chores Not difficult at all Not difficult at all Not difficult at all     Data saved with a previous flowsheet row definition    Relevant past medical, surgical, family and social history reviewed and updated as indicated. Interim medical history since our last visit reviewed. Allergies and medications reviewed and updated.  Review of Systems  Ten systems reviewed and is negative except as mentioned in HPI      Objective:      BP 139/87   Pulse (!) 54   Temp 98.1 F (36.7 C)   Ht 5' 2 (1.575 m)   Wt 246 lb (111.6 kg)   SpO2 93%   BMI 44.99 kg/m    Wt Readings from Last 3 Encounters:  08/28/24 246 lb (111.6 kg)  02/27/24 240 lb 3.2 oz (109 kg)  12/12/23 240 lb 6.4 oz (109 kg)    Physical Exam VITALS: BP- 139/87 GENERAL: Alert, cooperative, well developed, no acute distress HEENT: Normocephalic, normal oropharynx, moist mucous membranes NECK: Non-tender CHEST: Clear to auscultation bilaterally, no wheezes, rhonchi, or crackles CARDIOVASCULAR: Regular rate and rhythm, S1 and S2 normal without  murmurs ABDOMEN: Soft, non-tender, non-distended, without organomegaly, normal bowel sounds EXTREMITIES: No cyanosis or edema NEUROLOGICAL: Cranial nerves grossly intact, moves all extremities without gross motor or sensory deficit  Results for orders placed or performed in visit on 02/27/24  CBC with Differential/Platelet   Collection Time: 02/27/24  8:20 AM  Result Value Ref Range   WBC 7.9 3.8 - 10.8 Thousand/uL   RBC 4.55 3.80 - 5.10 Million/uL   Hemoglobin 14.1 11.7 - 15.5 g/dL   HCT 57.1 64.9 - 54.9 %   MCV 94.1 80.0 - 100.0 fL   MCH 31.0 27.0 - 33.0 pg   MCHC 32.9 32.0 - 36.0 g/dL   RDW 85.9 88.9 - 84.9 %   Platelets 242 140 - 400 Thousand/uL   MPV 9.9 7.5 - 12.5 fL   Neutro Abs 5,735 1,500 - 7,800 cells/uL    Absolute Lymphocytes 1,454 850 - 3,900 cells/uL   Absolute Monocytes 474 200 - 950 cells/uL   Eosinophils Absolute 198 15 - 500 cells/uL   Basophils Absolute 40 0 - 200 cells/uL   Neutrophils Relative % 72.6 %   Total Lymphocyte 18.4 %   Monocytes Relative 6.0 %   Eosinophils Relative 2.5 %   Basophils Relative 0.5 %  Comprehensive metabolic panel with GFR   Collection Time: 02/27/24  8:20 AM  Result Value Ref Range   Glucose, Bld 109 (H) 65 - 99 mg/dL   BUN 19 7 - 25 mg/dL   Creat 9.06 9.39 - 8.99 mg/dL   eGFR 64 > OR = 60 fO/fpw/8.26f7   BUN/Creatinine Ratio SEE NOTE: 6 - 22 (calc)   Sodium 140 135 - 146 mmol/L   Potassium 4.3 3.5 - 5.3 mmol/L   Chloride 102 98 - 110 mmol/L   CO2 30 20 - 32 mmol/L   Calcium  9.7 8.6 - 10.4 mg/dL   Total Protein 6.7 6.1 - 8.1 g/dL   Albumin 4.3 3.6 - 5.1 g/dL   Globulin 2.4 1.9 - 3.7 g/dL (calc)   AG Ratio 1.8 1.0 - 2.5 (calc)   Total Bilirubin 0.7 0.2 - 1.2 mg/dL   Alkaline phosphatase (APISO) 95 37 - 153 U/L   AST 20 10 - 35 U/L   ALT 18 6 - 29 U/L  Lipid panel   Collection Time: 02/27/24  8:20 AM  Result Value Ref Range   Cholesterol 168 <200 mg/dL   HDL 44 (L) > OR = 50 mg/dL   Triglycerides 715 (H) <150 mg/dL   LDL Cholesterol (Calc) 87 mg/dL (calc)   Total CHOL/HDL Ratio 3.8 <5.0 (calc)   Non-HDL Cholesterol (Calc) 124 <130 mg/dL (calc)  Thyroid  Panel With TSH   Collection Time: 02/27/24  8:20 AM  Result Value Ref Range   T3 Uptake 27 22 - 35 %   T4, Total 7.2 5.1 - 11.9 mcg/dL   Free Thyroxine Index 1.9 1.4 - 3.8   TSH 5.09 (H) 0.40 - 4.50 mIU/L  Hemoglobin A1c   Collection Time: 02/27/24  8:20 AM  Result Value Ref Range   Hgb A1c MFr Bld 6.0 (H) <5.7 %   Mean Plasma Glucose 126 mg/dL   eAG (mmol/L) 7.0 mmol/L          Assessment & Plan:   Problem List Items Addressed This Visit       Cardiovascular and Mediastinum   Essential hypertension - Primary   Relevant Medications   olmesartan -hydrochlorothiazide   (BENICAR  HCT) 20-12.5 MG tablet   Other Relevant Orders  CBC with Differential/Platelet   Comprehensive metabolic panel with GFR     Digestive   GERD (gastroesophageal reflux disease)     Endocrine   Subclinical hypothyroidism   Relevant Orders   Thyroid  Panel With TSH     Musculoskeletal and Integument   Seropositive rheumatoid arthritis (HCC)     Genitourinary   Chronic kidney disease, stage III (moderate) (HCC)     Other   Hyperlipidemia   Relevant Medications   olmesartan -hydrochlorothiazide  (BENICAR  HCT) 20-12.5 MG tablet   Other Relevant Orders   Lipid panel   Obesity, morbid (HCC)   Female stress incontinence   Relevant Medications   oxybutynin  (DITROPAN ) 5 MG tablet   Prediabetes   Relevant Orders   Hemoglobin A1c   GAD (generalized anxiety disorder)   Mild episode of recurrent major depressive disorder   Other Visit Diagnoses       Overactive bladder       Relevant Medications   oxybutynin  (DITROPAN ) 5 MG tablet        Assessment and Plan Assessment & Plan Essential hypertension Blood pressure is well-controlled at 139/87 mmHg. - Continue metoprolol  100 mg daily - Continue olmesartan -hydrochlorothiazide  20-12.5 mg daily  Gastroesophageal reflux disease - Continue omeprazole  20 mg daily  Subclinical hypothyroidism TSH is elevated at 5.09, but T3 and T4 are normal. No symptoms reported. - Continue monitoring thyroid  function tests  Seropositive rheumatoid arthritis Intermittent pain in the arm, possibly related to muscle or tendon, not joint. Pain relieved by Tylenol . - Continue methotrexate 20 mg weekly - Use Tylenol  as needed for pain - Consider using Icy Hot for topical relief  Chronic kidney disease, stage 3 No specific discussion or changes in management during this visit.  Generalized anxiety disorder Anxiety managed with hydroxyzine  as needed. - Continue hydroxyzine  25 mg as needed  Hyperlipidemia Managed with rosuvastatin . -  Continue rosuvastatin  40 mg daily  Major depressive disorder, recurrent No specific discussion or changes in management during this visit. Depression screening negative  Obesity - Encourage continuation of lifestyle modifications, including dietary management and regular exercise. -continue to increase physical activity, getting at least 150 min of physical activity a week.  Work on including runner, broadcasting/film/video 2 days a week.  - continue eating at a calorie deficit 1600-1700 cal a day, eating a well balanced diet with whole foods, avoiding processed foods.   Patient is motivated to continue working on lifestyle modification.    Prediabetes Last A1c was 6.0, indicating good control.  General health maintenance Due for lab work. - Ordered lab work        Follow up plan: Return in about 6 months (around 02/26/2025) for follow up.

## 2024-08-29 ENCOUNTER — Ambulatory Visit: Payer: Self-pay | Admitting: Nurse Practitioner

## 2024-08-29 LAB — COMPREHENSIVE METABOLIC PANEL WITH GFR
AG Ratio: 1.6 (calc) (ref 1.0–2.5)
ALT: 20 U/L (ref 6–29)
AST: 21 U/L (ref 10–35)
Albumin: 4 g/dL (ref 3.6–5.1)
Alkaline phosphatase (APISO): 96 U/L (ref 37–153)
BUN/Creatinine Ratio: 18 (calc) (ref 6–22)
BUN: 18 mg/dL (ref 7–25)
CO2: 29 mmol/L (ref 20–32)
Calcium: 9.1 mg/dL (ref 8.6–10.4)
Chloride: 102 mmol/L (ref 98–110)
Creat: 1.02 mg/dL — ABNORMAL HIGH (ref 0.60–1.00)
Globulin: 2.5 g/dL (ref 1.9–3.7)
Glucose, Bld: 95 mg/dL (ref 65–99)
Potassium: 4.3 mmol/L (ref 3.5–5.3)
Sodium: 140 mmol/L (ref 135–146)
Total Bilirubin: 0.8 mg/dL (ref 0.2–1.2)
Total Protein: 6.5 g/dL (ref 6.1–8.1)
eGFR: 57 mL/min/1.73m2 — ABNORMAL LOW (ref >=60)

## 2024-08-29 LAB — CBC WITH DIFFERENTIAL/PLATELET
Absolute Lymphocytes: 1380 {cells}/uL (ref 850–3900)
Absolute Monocytes: 407 {cells}/uL (ref 200–950)
Basophils Absolute: 41 {cells}/uL (ref 0–200)
Basophils Relative: 0.6 %
Eosinophils Absolute: 200 {cells}/uL (ref 15–500)
Eosinophils Relative: 2.9 %
HCT: 39.6 % (ref 35.9–46.0)
Hemoglobin: 13.1 g/dL (ref 11.7–15.5)
MCH: 30.6 pg (ref 27.0–33.0)
MCHC: 33.1 g/dL (ref 31.6–35.4)
MCV: 92.5 fL (ref 81.4–101.7)
MPV: 10.2 fL (ref 7.5–12.5)
Monocytes Relative: 5.9 %
Neutro Abs: 4871 {cells}/uL (ref 1500–7800)
Neutrophils Relative %: 70.6 %
Platelets: 247 Thousand/uL (ref 140–400)
RBC: 4.28 Million/uL (ref 3.80–5.10)
RDW: 14.3 % (ref 11.0–15.0)
Total Lymphocyte: 20 %
WBC: 6.9 Thousand/uL (ref 3.8–10.8)

## 2024-08-29 LAB — THYROID PANEL WITH TSH
Free Thyroxine Index: 2.1 (ref 1.4–3.8)
T3 Uptake: 29 % (ref 22–35)
T4, Total: 7.4 ug/dL (ref 5.1–11.9)
TSH: 4.15 m[IU]/L (ref 0.40–4.50)

## 2024-08-29 LAB — LIPID PANEL
Cholesterol: 147 mg/dL (ref <200)
HDL: 41 mg/dL — ABNORMAL LOW (ref >=50)
LDL Cholesterol (Calc): 69 mg/dL
Non-HDL Cholesterol (Calc): 106 mg/dL (ref <130)
Total CHOL/HDL Ratio: 3.6 (calc) (ref <5.0)
Triglycerides: 304 mg/dL — ABNORMAL HIGH (ref <150)

## 2024-08-29 LAB — HEMOGLOBIN A1C
Hgb A1c MFr Bld: 5.7 % — ABNORMAL HIGH (ref <5.7)
Mean Plasma Glucose: 117 mg/dL
eAG (mmol/L): 6.5 mmol/L

## 2025-02-26 ENCOUNTER — Ambulatory Visit: Admitting: Nurse Practitioner
